# Patient Record
Sex: Female | Born: 1935 | Race: White | Hispanic: No | Smoking: Former smoker
Health system: Southern US, Community
[De-identification: ages and names within clinical notes are randomized; demographics above are authoritative.]

## PROBLEM LIST (undated history)

## (undated) DIAGNOSIS — I5022 Chronic systolic (congestive) heart failure: Secondary | ICD-10-CM

## (undated) DIAGNOSIS — E119 Type 2 diabetes mellitus without complications: Secondary | ICD-10-CM

## (undated) DIAGNOSIS — J449 Chronic obstructive pulmonary disease, unspecified: Secondary | ICD-10-CM

## (undated) DIAGNOSIS — D649 Anemia, unspecified: Secondary | ICD-10-CM

## (undated) DIAGNOSIS — I129 Hypertensive chronic kidney disease with stage 1 through stage 4 chronic kidney disease, or unspecified chronic kidney disease: Secondary | ICD-10-CM

## (undated) DIAGNOSIS — I214 Non-ST elevation (NSTEMI) myocardial infarction: Secondary | ICD-10-CM

## (undated) DIAGNOSIS — S42309A Unspecified fracture of shaft of humerus, unspecified arm, initial encounter for closed fracture: Secondary | ICD-10-CM

## (undated) DIAGNOSIS — M6281 Muscle weakness (generalized): Secondary | ICD-10-CM

## (undated) DIAGNOSIS — F419 Anxiety disorder, unspecified: Secondary | ICD-10-CM

## (undated) DIAGNOSIS — I739 Peripheral vascular disease, unspecified: Secondary | ICD-10-CM

## (undated) DIAGNOSIS — F0390 Unspecified dementia without behavioral disturbance: Secondary | ICD-10-CM

## (undated) DIAGNOSIS — I480 Paroxysmal atrial fibrillation: Secondary | ICD-10-CM

## (undated) DIAGNOSIS — R1310 Dysphagia, unspecified: Secondary | ICD-10-CM

## (undated) HISTORY — DX: Chronic obstructive pulmonary disease, unspecified: J44.9

## (undated) HISTORY — DX: Dysphagia, unspecified: R13.10

## (undated) HISTORY — DX: Unspecified dementia, unspecified severity, without behavioral disturbance, psychotic disturbance, mood disturbance, and anxiety: F03.90

## (undated) HISTORY — DX: Non-ST elevation (NSTEMI) myocardial infarction: I21.4

## (undated) HISTORY — DX: Peripheral vascular disease, unspecified: I73.9

## (undated) HISTORY — DX: Anxiety disorder, unspecified: F41.9

## (undated) HISTORY — DX: Type 2 diabetes mellitus without complications: E11.9

## (undated) HISTORY — DX: Anemia, unspecified: D64.9

## (undated) HISTORY — DX: Unspecified fracture of shaft of humerus, unspecified arm, initial encounter for closed fracture: S42.309A

## (undated) HISTORY — DX: Paroxysmal atrial fibrillation: I48.0

## (undated) HISTORY — DX: Muscle weakness (generalized): M62.81

## (undated) HISTORY — PX: PERIPHERAL ARTERIAL STENT GRAFT: SHX2220

## (undated) HISTORY — DX: Hypertensive chronic kidney disease with stage 1 through stage 4 chronic kidney disease, or unspecified chronic kidney disease: I12.9

## (undated) HISTORY — PX: OTHER SURGICAL HISTORY: SHX169

---

## 2005-01-06 ENCOUNTER — Inpatient Hospital Stay: Payer: Self-pay | Admitting: Internal Medicine

## 2005-01-06 ENCOUNTER — Other Ambulatory Visit: Payer: Self-pay

## 2005-01-07 ENCOUNTER — Other Ambulatory Visit: Payer: Self-pay

## 2007-01-24 ENCOUNTER — Other Ambulatory Visit: Payer: Self-pay

## 2007-01-24 ENCOUNTER — Emergency Department: Payer: Self-pay | Admitting: General Practice

## 2007-09-22 ENCOUNTER — Ambulatory Visit: Payer: Self-pay | Admitting: General Surgery

## 2009-09-15 ENCOUNTER — Ambulatory Visit: Payer: Self-pay | Admitting: Internal Medicine

## 2009-09-18 ENCOUNTER — Ambulatory Visit: Payer: Self-pay | Admitting: Internal Medicine

## 2009-09-22 ENCOUNTER — Ambulatory Visit: Payer: Self-pay | Admitting: Vascular Surgery

## 2009-09-26 ENCOUNTER — Ambulatory Visit: Payer: Self-pay | Admitting: Internal Medicine

## 2011-01-29 ENCOUNTER — Emergency Department: Payer: Self-pay | Admitting: Emergency Medicine

## 2011-06-10 ENCOUNTER — Ambulatory Visit: Payer: Self-pay

## 2015-02-26 ENCOUNTER — Emergency Department
Admit: 2015-02-26 | Disposition: A | Discharge status: Home / Self Care | Payer: Self-pay | Admitting: Emergency Medicine

## 2015-02-28 ENCOUNTER — Inpatient Hospital Stay
Admit: 2015-02-28 | Disposition: A | Discharge status: Skilled Nursing Facility | Payer: Self-pay | Attending: Internal Medicine | Admitting: Internal Medicine

## 2015-02-28 ENCOUNTER — Ambulatory Visit
Admit: 2015-02-28 | Disposition: A | Discharge status: Home / Self Care | Payer: Self-pay | Attending: Internal Medicine | Admitting: Internal Medicine

## 2015-02-28 LAB — COMPREHENSIVE METABOLIC PANEL
ANION GAP: 12 (ref 7–16)
Albumin: 3.5 g/dL
Alkaline Phosphatase: 50 U/L
BUN: 46 mg/dL — ABNORMAL HIGH
Bilirubin,Total: 0.5 mg/dL
Calcium, Total: 8.5 mg/dL — ABNORMAL LOW
Chloride: 101 mmol/L
Co2: 26 mmol/L
Creatinine: 2.09 mg/dL — ABNORMAL HIGH
EGFR (African American): 24 — ABNORMAL LOW
EGFR (Non-African Amer.): 21 — ABNORMAL LOW
GLUCOSE: 279 mg/dL — AB
Potassium: 5 mmol/L
SGOT(AST): 22 U/L
SGPT (ALT): 10 U/L — ABNORMAL LOW
Sodium: 139 mmol/L
Total Protein: 7.3 g/dL

## 2015-02-28 LAB — URINALYSIS, COMPLETE
BILIRUBIN, UR: NEGATIVE
Bacteria: NONE SEEN
Glucose,UR: 150 mg/dL (ref 0–75)
KETONE: NEGATIVE
Leukocyte Esterase: NEGATIVE
NITRITE: NEGATIVE
Ph: 5 (ref 4.5–8.0)
Protein: NEGATIVE
RBC,UR: NONE SEEN /HPF (ref 0–5)
SPECIFIC GRAVITY: 1.01 (ref 1.003–1.030)
WBC UR: <1 /HPF (ref 0–5)

## 2015-02-28 LAB — CK TOTAL AND CKMB (NOT AT ARMC)
CK, Total: 58 U/L
CK-MB: 3.5 ng/mL

## 2015-02-28 LAB — CBC
HCT: 24.1 % — AB (ref 35.0–47.0)
HGB: 7.5 g/dL — ABNORMAL LOW (ref 12.0–16.0)
MCH: 28.2 pg (ref 26.0–34.0)
MCHC: 31.3 g/dL — ABNORMAL LOW (ref 32.0–36.0)
MCV: 90 fL (ref 80–100)
Platelet: 189 10*3/uL (ref 150–440)
RBC: 2.67 10*6/uL — AB (ref 3.80–5.20)
RDW: 14.6 % — ABNORMAL HIGH (ref 11.5–14.5)
WBC: 11.7 10*3/uL — AB (ref 3.6–11.0)

## 2015-02-28 LAB — TROPONIN I: Troponin-I: 0.24 ng/mL — ABNORMAL HIGH

## 2015-03-01 LAB — MAGNESIUM: Magnesium: 1.5 mg/dL — ABNORMAL LOW

## 2015-03-01 LAB — CBC WITH DIFFERENTIAL/PLATELET
BASOS PCT: 0.7 %
Basophil #: 0.1 10*3/uL (ref 0.0–0.1)
Eosinophil #: 0 10*3/uL (ref 0.0–0.7)
Eosinophil %: 0 %
HCT: 20.9 % — ABNORMAL LOW (ref 35.0–47.0)
HGB: 6.8 g/dL — AB (ref 12.0–16.0)
Lymphocyte #: 2.3 10*3/uL (ref 1.0–3.6)
Lymphocyte %: 18.5 %
MCH: 28.9 pg (ref 26.0–34.0)
MCHC: 32.6 g/dL (ref 32.0–36.0)
MCV: 89 fL (ref 80–100)
MONO ABS: 1.6 x10 3/mm — AB (ref 0.2–0.9)
Monocyte %: 12.4 %
NEUTROS PCT: 68.4 %
Neutrophil #: 8.7 10*3/uL — ABNORMAL HIGH (ref 1.4–6.5)
Platelet: 175 10*3/uL (ref 150–440)
RBC: 2.36 10*6/uL — ABNORMAL LOW (ref 3.80–5.20)
RDW: 14.6 % — ABNORMAL HIGH (ref 11.5–14.5)
WBC: 12.7 10*3/uL — ABNORMAL HIGH (ref 3.6–11.0)

## 2015-03-01 LAB — LIPID PANEL
Cholesterol: 103 mg/dL
HDL Cholesterol: 34 mg/dL — ABNORMAL LOW
LDL CHOLESTEROL, CALC: 40 mg/dL
TRIGLYCERIDES: 143 mg/dL
VLDL Cholesterol, Calc: 29 mg/dL

## 2015-03-01 LAB — BASIC METABOLIC PANEL
Anion Gap: 7 (ref 7–16)
BUN: 39 mg/dL — AB
CALCIUM: 7.8 mg/dL — AB
CHLORIDE: 104 mmol/L
Co2: 27 mmol/L
Creatinine: 1.71 mg/dL — ABNORMAL HIGH
EGFR (African American): 31 — ABNORMAL LOW
EGFR (Non-African Amer.): 26 — ABNORMAL LOW
GLUCOSE: 225 mg/dL — AB
POTASSIUM: 3.8 mmol/L
Sodium: 138 mmol/L

## 2015-03-01 LAB — PRO B NATRIURETIC PEPTIDE: B-Type Natriuretic Peptide: 1944 pg/mL — ABNORMAL HIGH

## 2015-03-01 LAB — CK-MB
CK-MB: 7.7 ng/mL — AB
CK-MB: 15 ng/mL — ABNORMAL HIGH

## 2015-03-01 LAB — TROPONIN I
TROPONIN-I: 0.55 ng/mL — AB
TROPONIN-I: 1.39 ng/mL — AB

## 2015-03-01 LAB — HEMOGLOBIN A1C: Hemoglobin A1C: 6.5 % — ABNORMAL HIGH

## 2015-03-02 LAB — CBC WITH DIFFERENTIAL/PLATELET
BASOS ABS: 0 10*3/uL (ref 0.0–0.1)
BASOS PCT: 0.4 %
EOS ABS: 0.1 10*3/uL (ref 0.0–0.7)
Eosinophil %: 1.3 %
HCT: 23 % — ABNORMAL LOW (ref 35.0–47.0)
HGB: 7.4 g/dL — ABNORMAL LOW (ref 12.0–16.0)
Lymphocyte #: 2.2 10*3/uL (ref 1.0–3.6)
Lymphocyte %: 19.4 %
MCH: 28.9 pg (ref 26.0–34.0)
MCHC: 32.3 g/dL (ref 32.0–36.0)
MCV: 89 fL (ref 80–100)
Monocyte #: 1.3 x10 3/mm — ABNORMAL HIGH (ref 0.2–0.9)
Monocyte %: 11 %
Neutrophil #: 7.8 10*3/uL — ABNORMAL HIGH (ref 1.4–6.5)
Neutrophil %: 67.9 %
Platelet: 164 10*3/uL (ref 150–440)
RBC: 2.57 10*6/uL — ABNORMAL LOW (ref 3.80–5.20)
RDW: 14.6 % — AB (ref 11.5–14.5)
WBC: 11.5 10*3/uL — AB (ref 3.6–11.0)

## 2015-03-02 LAB — BASIC METABOLIC PANEL
Anion Gap: 3 — ABNORMAL LOW (ref 7–16)
BUN: 39 mg/dL — ABNORMAL HIGH
CALCIUM: 7.1 mg/dL — AB
CHLORIDE: 107 mmol/L
Co2: 24 mmol/L
Creatinine: 1.82 mg/dL — ABNORMAL HIGH
EGFR (African American): 28 — ABNORMAL LOW
EGFR (Non-African Amer.): 25 — ABNORMAL LOW
Glucose: 186 mg/dL — ABNORMAL HIGH
Potassium: 3.6 mmol/L
Sodium: 134 mmol/L — ABNORMAL LOW

## 2015-03-03 LAB — BASIC METABOLIC PANEL
ANION GAP: 9 (ref 7–16)
BUN: 45 mg/dL — ABNORMAL HIGH
CHLORIDE: 107 mmol/L
Calcium, Total: 7.7 mg/dL — ABNORMAL LOW
Co2: 22 mmol/L
Creatinine: 1.87 mg/dL — ABNORMAL HIGH
EGFR (African American): 28 — ABNORMAL LOW
EGFR (Non-African Amer.): 24 — ABNORMAL LOW
Glucose: 218 mg/dL — ABNORMAL HIGH
Potassium: 3.8 mmol/L
SODIUM: 138 mmol/L

## 2015-03-03 LAB — HEMOGLOBIN: HGB: 8.1 g/dL — AB (ref 12.0–16.0)

## 2015-03-04 LAB — CBC WITH DIFFERENTIAL/PLATELET
BASOS ABS: 0 10*3/uL (ref 0.0–0.1)
Basophil %: 0.5 %
Eosinophil #: 0.4 10*3/uL (ref 0.0–0.7)
Eosinophil %: 4.5 %
HCT: 24.4 % — ABNORMAL LOW (ref 35.0–47.0)
HGB: 7.7 g/dL — ABNORMAL LOW (ref 12.0–16.0)
Lymphocyte #: 2.5 10*3/uL (ref 1.0–3.6)
Lymphocyte %: 26.7 %
MCH: 28.9 pg (ref 26.0–34.0)
MCHC: 31.8 g/dL — AB (ref 32.0–36.0)
MCV: 91 fL (ref 80–100)
MONO ABS: 1.1 x10 3/mm — AB (ref 0.2–0.9)
Monocyte %: 11.8 %
NEUTROS PCT: 56.5 %
Neutrophil #: 5.2 10*3/uL (ref 1.4–6.5)
PLATELETS: 214 10*3/uL (ref 150–440)
RBC: 2.68 10*6/uL — ABNORMAL LOW (ref 3.80–5.20)
RDW: 15.3 % — ABNORMAL HIGH (ref 11.5–14.5)
WBC: 9.2 10*3/uL (ref 3.6–11.0)

## 2015-03-04 LAB — CREATININE, SERUM
Creatinine: 1.87 mg/dL — ABNORMAL HIGH
EGFR (African American): 28 — ABNORMAL LOW
EGFR (Non-African Amer.): 24 — ABNORMAL LOW

## 2015-03-04 LAB — MAGNESIUM: Magnesium: 3.2 mg/dL — ABNORMAL HIGH

## 2015-03-05 ENCOUNTER — Encounter
Admit: 2015-03-05 | Disposition: A | Discharge status: Home / Self Care | Payer: Self-pay | Attending: Internal Medicine | Admitting: Internal Medicine

## 2015-03-05 LAB — BASIC METABOLIC PANEL
Anion Gap: 7 (ref 7–16)
BUN: 48 mg/dL — AB
CO2: 20 mmol/L — AB
Calcium, Total: 7.8 mg/dL — ABNORMAL LOW
Chloride: 114 mmol/L — ABNORMAL HIGH
Creatinine: 1.62 mg/dL — ABNORMAL HIGH
GFR CALC AF AMER: 33 — AB
GFR CALC NON AF AMER: 28 — AB
GLUCOSE: 227 mg/dL — AB
Potassium: 4.1 mmol/L
Sodium: 141 mmol/L

## 2015-03-05 LAB — URINALYSIS, COMPLETE
BILIRUBIN, UR: NEGATIVE
GLUCOSE, UR: NEGATIVE mg/dL (ref 0–75)
Ketone: NEGATIVE
NITRITE: NEGATIVE
Ph: 5 (ref 4.5–8.0)
Protein: 30
RBC,UR: 1 /HPF (ref 0–5)
Specific Gravity: 1.012 (ref 1.003–1.030)
Squamous Epithelial: <1
WBC UR: 16 /HPF (ref 0–5)

## 2015-03-05 LAB — HEMOGLOBIN: HGB: 8.4 g/dL — ABNORMAL LOW (ref 12.0–16.0)

## 2015-03-07 LAB — CBC WITH DIFFERENTIAL/PLATELET
BASOS ABS: 0.1 10*3/uL (ref 0.0–0.1)
Basophil %: 0.7 %
EOS PCT: 2.4 %
Eosinophil #: 0.3 10*3/uL (ref 0.0–0.7)
HCT: 26.7 % — ABNORMAL LOW (ref 35.0–47.0)
HGB: 8.3 g/dL — ABNORMAL LOW (ref 12.0–16.0)
Lymphocyte #: 2.5 10*3/uL (ref 1.0–3.6)
Lymphocyte %: 20.6 %
MCH: 28.9 pg (ref 26.0–34.0)
MCHC: 31.2 g/dL — ABNORMAL LOW (ref 32.0–36.0)
MCV: 93 fL (ref 80–100)
MONO ABS: 1.2 x10 3/mm — AB (ref 0.2–0.9)
MONOS PCT: 10.3 %
NEUTROS PCT: 66 %
Neutrophil #: 7.9 10*3/uL — ABNORMAL HIGH (ref 1.4–6.5)
Platelet: 265 10*3/uL (ref 150–440)
RBC: 2.88 10*6/uL — AB (ref 3.80–5.20)
RDW: 16.1 % — ABNORMAL HIGH (ref 11.5–14.5)
WBC: 11.9 10*3/uL — AB (ref 3.6–11.0)

## 2015-03-07 LAB — COMPREHENSIVE METABOLIC PANEL
Albumin: 2.5 g/dL — ABNORMAL LOW
Alkaline Phosphatase: 68 U/L
Anion Gap: 7 (ref 7–16)
BILIRUBIN TOTAL: 0.4 mg/dL
BUN: 47 mg/dL — AB
CHLORIDE: 110 mmol/L
CREATININE: 1.78 mg/dL — AB
Calcium, Total: 8.1 mg/dL — ABNORMAL LOW
Co2: 23 mmol/L
EGFR (Non-African Amer.): 25 — ABNORMAL LOW
GFR CALC AF AMER: 29 — AB
Glucose: 209 mg/dL — ABNORMAL HIGH
Potassium: 4.2 mmol/L
SGOT(AST): 18 U/L
SGPT (ALT): 12 U/L — ABNORMAL LOW
SODIUM: 140 mmol/L
TOTAL PROTEIN: 6.1 g/dL — AB

## 2015-03-07 LAB — MAGNESIUM: Magnesium: 1.9 mg/dL

## 2015-03-07 LAB — URINE CULTURE

## 2015-03-07 LAB — TSH: Thyroid Stimulating Horm: 1.449 u[IU]/mL

## 2015-03-11 LAB — BASIC METABOLIC PANEL
ANION GAP: 7 (ref 7–16)
BUN: 47 mg/dL — ABNORMAL HIGH
CALCIUM: 8 mg/dL — AB
Chloride: 109 mmol/L
Co2: 27 mmol/L
Creatinine: 1.48 mg/dL — ABNORMAL HIGH
EGFR (African American): 37 — ABNORMAL LOW
EGFR (Non-African Amer.): 32 — ABNORMAL LOW
GLUCOSE: 209 mg/dL — AB
Potassium: 3.9 mmol/L
Sodium: 143 mmol/L

## 2015-03-11 LAB — CBC WITH DIFFERENTIAL/PLATELET
BASOS PCT: 0.4 %
Basophil #: 0 10*3/uL (ref 0.0–0.1)
EOS PCT: 3.3 %
Eosinophil #: 0.3 10*3/uL (ref 0.0–0.7)
HCT: 26.4 % — AB (ref 35.0–47.0)
HGB: 8.2 g/dL — ABNORMAL LOW (ref 12.0–16.0)
LYMPHS ABS: 1.6 10*3/uL (ref 1.0–3.6)
Lymphocyte %: 15.6 %
MCH: 29 pg (ref 26.0–34.0)
MCHC: 31.2 g/dL — ABNORMAL LOW (ref 32.0–36.0)
MCV: 93 fL (ref 80–100)
MONOS PCT: 8.1 %
Monocyte #: 0.8 x10 3/mm (ref 0.2–0.9)
Neutrophil #: 7.4 10*3/uL — ABNORMAL HIGH (ref 1.4–6.5)
Neutrophil %: 72.6 %
Platelet: 244 10*3/uL (ref 150–440)
RBC: 2.84 10*6/uL — AB (ref 3.80–5.20)
RDW: 16.3 % — ABNORMAL HIGH (ref 11.5–14.5)
WBC: 10.2 10*3/uL (ref 3.6–11.0)

## 2015-03-23 LAB — PROTIME-INR
INR: 1
Prothrombin Time: 13.2 secs

## 2015-03-27 ENCOUNTER — Encounter: Payer: Self-pay | Admitting: Cardiovascular Disease

## 2015-03-27 ENCOUNTER — Encounter (INDEPENDENT_AMBULATORY_CARE_PROVIDER_SITE_OTHER): Payer: Self-pay

## 2015-03-27 ENCOUNTER — Ambulatory Visit (INDEPENDENT_AMBULATORY_CARE_PROVIDER_SITE_OTHER): Payer: Medicare PPO | Admitting: Cardiovascular Disease

## 2015-03-27 VITALS — BP 138/48 | HR 71 | Ht 61.0 in | Wt 112.4 lb

## 2015-03-27 DIAGNOSIS — I4891 Unspecified atrial fibrillation: Secondary | ICD-10-CM | POA: Diagnosis not present

## 2015-03-27 NOTE — Patient Instructions (Signed)
Medication Instructions: - Stop plavix  Labwork: - none  Procedures/Testing: - none  Follow-Up: - We will see you back as needed  Any Additional Special Instructions Will Be Listed Below (If Applicable). - none

## 2015-03-27 NOTE — Progress Notes (Signed)
Cardiology Office Note   Date:  03/27/2015   ID:  Laura Jefferson, DOB January 04, 1925, MRN [id number]  PCP:  Marcine Matar, MD  Cardiologist:   Vesta Mixer, MD   Chief Complaint  Patient presents with  . other    Hx of afib, sob and edema. Meds reviewed verbally with pt.   Problem List 1.  Hx of Atrial fib 2. Dementia 3. Essential Hypertension 4. CHF 5. Diabetes Mellitus 6. Chronic kidney disease- estimated GFR of 24 7. Anemia   History of Present Illness: Laura Jefferson is a 81 y.o. female who presents for follow-up of her recent hospitalization at [facility].  The patient was admitted on April 1 after falling and fracturing her left humerus. She had minimal troponin elevation. She was seen in consultation by Dr. Adrian Blackwater for Cardiology .  She was deemed to not be candidate for invasive procedures and was treated medically with ASA, plavix and simvastatin.  She had an echocardiogram which revealed mild left ventricular dysfunction with an ejection fraction of 40-45%. She had moderate left atrial dilatation, mild right atrial dilatation, moderate mitral regurgitation, moderate tricuspid regurgitation. She had mild - moderate pulmonary artery hypertension with an estimated PA pressure of 43 mm hg.    On admission she was also found to have chronic kidney disease, significant anemia with hemoglobin of 7.5 that later dropped to 6.8 with hydration. She gradually improved and was discharged to [facility].  There is a reported hx of paroxysmal atrial fib but I could not find any atrial fib documented during the hospitalization.   She had an episode of shortness of breath yesterday after physical therapy.   She only partly remembers the episode .  Daughter Briant Cedar ) has been caring for her for the past 8 years - cleaning house, shopping, paying bills.  Pt's husband died several years ago and [name] has had to be more active in her care.  She just quit  smoking with this hospitalization.  Now on home O2.    Past Medical History  Diagnosis Date  . Muscle weakness   . Non-ST elevated myocardial infarction   . Humerus fracture   . Dysphagia   . Anemia   . Paroxysmal a-fib   . CHF (congestive heart failure)   . COPD (chronic obstructive pulmonary disease)   . Hypertensive chronic kidney disease   . Diabetes mellitus without complication   . PVD (peripheral vascular disease)   . Dementia   . Anxiety disorder     Past Surgical History  Procedure Laterality Date  . Arm surgery    . Peripheral arterial stent graft       Current Outpatient Prescriptions  Medication Sig Dispense Refill  . Amino Acids-Protein Hydrolys (FEEDING SUPPLEMENT, PRO-STAT SUGAR FREE 64,) LIQD Take 30 mLs by mouth 2 (two) times daily between meals.    Marland Kitchen amoxicillin-clavulanate (AUGMENTIN) 875-125 MG per tablet Take 1 tablet by mouth 2 (two) times daily.    Marland Kitchen aspirin 81 MG tablet Take 81 mg by mouth daily.    . Cholecalciferol (VITAMIN D) 2000 UNITS tablet Take 2,000 Units by mouth daily.    Marland Kitchen docusate sodium (COLACE) 100 MG capsule Take 100 mg by mouth 2 (two) times daily as needed for mild constipation.    . folic acid (FOLVITE) 400 MCG tablet Take 400 mcg by mouth daily.    . furosemide (LASIX) 40 MG tablet Take 60 mg by mouth daily.    Marland Kitchen  glipiZIDE (GLUCOTROL XL) 2.5 MG 24 hr tablet Take 2.5 mg by mouth daily with breakfast.    . GLUCERNA (GLUCERNA) LIQD Take 237 mLs by mouth 2 (two) times daily between meals.    Marland Kitchen HYDROcodone-acetaminophen (NORCO/VICODIN) 5-325 MG per tablet Take 1 tablet by mouth every 6 (six) hours as needed for moderate pain.    Marland Kitchen insulin aspart (NOVOLOG) 100 UNIT/ML injection Inject into the skin as directed.    Marland Kitchen ipratropium-albuterol (DUONEB) 0.5-2.5 (3) MG/3ML SOLN Take 3 mLs by nebulization every 6 (six) hours as needed.    . metoprolol tartrate (LOPRESSOR) 25 MG tablet Take 25 mg by mouth 2 (two) times daily.    . nicotine (NICODERM  CQ - DOSED IN MG/24 HR) 7 mg/24hr patch Place 7 mg onto the skin daily.    . OXYGEN Inhale 2 L into the lungs as needed.    Marland Kitchen PARoxetine (PAXIL) 20 MG tablet Take 20 mg by mouth daily.    Marland Kitchen senna (SENOKOT) 8.6 MG TABS tablet Take 1 tablet by mouth 2 (two) times daily as needed for mild constipation.    . simvastatin (ZOCOR) 20 MG tablet Take 20 mg by mouth daily.    . Tiotropium Bromide Monohydrate 2.5 MCG/ACT AERS Inhale into the lungs daily.    . vitamin B-12 (CYANOCOBALAMIN) 1000 MCG tablet Take 1,000 mcg by mouth daily.     No current facility-administered medications for this visit.    Allergies:   Review of patient's allergies indicates no known allergies.    Social History:  The patient  reports that she quit smoking about 4 weeks ago. Her smoking use included Cigarettes. She quit after 70 years of use. She does not have any smokeless tobacco history on file. She reports that she does not drink alcohol or use illicit drugs.   Family History:  The patient's family history includes CAD in her son.    ROS:  Please see the history of present illness.    Review of Systems: Constitutional:  denies fever, chills, diaphoresis, appetite change and fatigue.  HEENT: denies photophobia, eye pain, redness, hearing loss, ear pain, congestion, sore throat, rhinorrhea, sneezing, neck pain, neck stiffness and tinnitus.  Respiratory: denies SOB, DOE, cough, chest tightness, and wheezing.  Cardiovascular: denies chest pain, palpitations and leg swelling.  Gastrointestinal: denies nausea, vomiting, abdominal pain, diarrhea, constipation, blood in stool.  Genitourinary: denies dysuria, urgency, frequency, hematuria, flank pain and difficulty urinating.  Musculoskeletal: denies  myalgias, back pain, joint swelling, arthralgias and gait problem.   Skin: denies pallor, rash and wound.  Neurological: denies dizziness, seizures, syncope, weakness, light-headedness, numbness and headaches.     Hematological: denies adenopathy, easy bruising, personal or family bleeding history.  Psychiatric/ Behavioral: denies suicidal ideation, mood changes, confusion, nervousness, sleep disturbance and agitation.       All other systems are reviewed and negative.    PHYSICAL EXAM: VS:  BP 138/48 mmHg  Pulse 71  Ht 5\' 1"  (1.549 m)  Wt 112 lb 7 oz (51.001 kg)  BMI 21.26 kg/m2 , BMI Body mass index is 21.26 kg/(m^2). GEN: Chronically ill-appearing, elderly female. She was seated in the wheelchair for the entire exam.  HEENT: normal Neck: no JVD, carotid bruits, or masses Cardiac: RRR; no murmurs, rubs, or gallops,no edema  Respiratory:  clear to auscultation bilaterally, normal work of breathing GI: soft, nontender, nondistended, + BS MS: her left arm is in a sling.  Extensive bruising on arms and legs.  Open sore on  right lower leg - bandaged  Skin: warm and dry, no rash Neuro:  Able to talk, was not able to contribute anything significant to the conversation,  Did not remember getting short of breath yesterday during her physical therapy.  Looked to the daughter to explain the details.  Psych: moderately demented    EKG:  EKG is ordered today. The ekg ordered today demonstrates NSR at 68. Marland Kitchen No ST or T wave changes.    Recent Labs: No results found for requested labs within last 365 days.    Lipid Panel No results found for: CHOL, TRIG, HDL, CHOLHDL, VLDL, LDLCALC, LDLDIRECT    Wt Readings from Last 3 Encounters:  03/27/15 112 lb 7 oz (51.001 kg)      Other studies Reviewed: Additional studies/ records that were reviewed today include: . Review of the above records demonstrates:    ASSESSMENT AND PLAN:  1.  Hx of Atrial fib - there is history of atrial fibrillation in the chart. I've not found any EKGs to support this diagnosis at this point.  2. Dementia - she has a moderate amount of dementia. She is able to talk but is not really able to contribute anything to the  conversation. She was unable to remember having any sort of difficulty during physical therapy yesterday. The shortness breath that she had during her physical therapy session was the reason why she was referred to our office.  She is in no acute distress now. I think that we need to focus on patient's safety and patient comfort primarily.  3. Essential Hypertension - blood pressures fairly normal. 4. Mild chronic systolic congestive heart failure:  The patient has mild systolic congestive heart failure by echo. Her ejection fraction is 40-45%.   She is not in any distress at this point. She is on Lasix 60 mg a day. It's difficult to increase her diuretic given the fact that she has stage IV chronic kidney disease. I think that over diuresing her with certainly worsen her renal insufficiency. Had long discussion with the patient's daughter.  The daughters expectation is that the patient will return to independent living within a week or so. We talked for proximal 30-45 minutes about the fact that I think that she will likely have to readjust those expectations. I think that Mrs. Sparkman will likely need extensive help going forward.  There are some medical issues and psychiatric issues that complicate her care. She's not a candidate for any invasive procedures. I do not think that adding ace inhibitors or ARB's are advisable given her renal insufficiency. Her heart rate is fairly normal and I do not think that adding a beta blocker would be advantageous.  I think our best option is to have her follow-up with her general medical doctor and he can adjust the diuretics as needed/as tolerated.  5. Diabetes Mellitus - will follow-up with her primary medical doctor.  6. Chronic kidney disease- estimated GFR of 24 - her creatinine on discharge was 1.8. She is a fairly small, elderly lady in certainly this represents significant chronic kidney disease.  7. Anemia- she has significant anemia when she was admitted  to the hospital. She has lots of bruising on her arms and legs. While this bruising may be fairly normal for her, I do not think that she needs the addition of Plavix. The Plavix was added empirically because of very minimal troponin elevations when she was admitted to the hospital. I do not think that Plavix is  adding any protection from ischemic heart disease and it certainly is increasing her bleeding and bruising. We will discontinue the Plavix.  8. Question of non-ST segment elevation myocardial infarction: Her troponin elevations were very minimal. I would not necessarily call this a NSTEMI.  I think it was more consistent with a troponin leak in the setting of severe stress, anemia, and renal insufficiency. She's not a candidate for any invasive testing. She'll continue the aspirin.   Current medicines are reviewed at length with the patient today.  The patient does not have concerns regarding medicines.  The following changes have been made:  no change  Labs/ tests ordered today include:   Orders Placed This Encounter  Procedures  . EKG 12-Lead     Disposition:   FU with me as needed.       Angus Amini, Deloris Ping, MD  03/27/2015 5:38 PM    [facility] [address] [place], [place], [place]  [address] Phone: [phone]; Fax: [phone]   [facility]  [address] [place], [place]  [address] [phone]    Fax [phone]

## 2015-03-30 ENCOUNTER — Encounter
Admission: RE | Admit: 2015-03-30 | Discharge: 2015-03-30 | Disposition: A | Discharge status: Home / Self Care | Payer: Medicare PPO | Source: Ambulatory Visit | Attending: Internal Medicine | Admitting: Internal Medicine

## 2015-03-30 NOTE — Consult Note (Signed)
PATIENT NAME:  Laura Jefferson, Laura Jefferson MR#:  [id number] DATE OF BIRTH:  [birth year]  DATE OF CONSULTATION:  03/01/2015  REFERRING PHYSICIAN:   CONSULTING PHYSICIAN:  Laurier Nancy, MD  INDICATION FOR CONSULTATION: Atrial fibrillation and CHF.   HISTORY OF PRESENT ILLNESS: This is an 81 year old white female with a past medical history of COPD, CHF, diabetes, dementia, who presented to the Emergency Room with weakness. Apparently, she was also very short of breath. I talked to her and she says, "I just don't feel good." She says she feels very weak. She feels worse than yesterday. Typically, she is able to move around. She is unable to do anything right now. She also had elevated troponin when she came in. Basically, unable to get much history, except for she says she feels weak and short of breath.   PAST MEDICAL HISTORY: As mentioned.    SOCIAL HISTORY: No history of EtOH abuse or smoking. She lives at a nursing home.   FAMILY HISTORY: Use. Her son has coronary artery disease.   PHYSICAL EXAMINATION: GENERAL: She is alert and oriented x 0  VITAL SIGNS: Her temperature is 97.3, pulse is 117, respirations 18, blood pressure 120/74, saturation 90.  NECK: Positive JVD.  LUNGS: There are few crepitations at the bases.  HEART: Normal S1, S2. No audible murmur.  ABDOMEN: Soft, nontender, positive bowel sounds.  EXTREMITIES: There is 1+ pedal edema.   LABORATORY DATA: Chest x-ray shows left basilar atelectasis with some effusions bilaterally. No pneumonia. CT of the head just showed cerebral atrophy. Her white count is 11.7, hemoglobin 7.5. BUN was 46, creatinine 2.09 troponin was 0.24. EKG showed normal sinus rhythm, old inferior wall myocardial infarction, nonspecific ST and T changes. BNP is 1944.   ASSESSMENT AND PLAN: The patient has congestive heart failure anemia, history of atrial fibrillation currently in sinus rhythm, mildly elevated troponin, history of dementia. Troponin maximum was 0.55 and  then 0.24 The patient has dementia; thus, a limited work-up should be done. Advise getting an echocardiogram. Will review whether the patient has systolic or diastolic dysfunction. In the meantime, advise continuing current medications. Will add Lasix 40 mg IV once a day.   Thank you very much for the referral     ____________________________ Laurier Nancy, MD sak:mw D: 03/01/2015 10:02:39 ET T: 03/01/2015 10:36:47 ET JOB#: [id number]  cc: Laurier Nancy, MD, <Dictator> Laurier Nancy MD ELECTRONICALLY SIGNED 03/11/2015 13:33

## 2015-03-30 NOTE — H&P (Signed)
PATIENT NAME:  Laura Jefferson, Laura Jefferson MR#:  [id number] DATE OF BIRTH:  1925-08-12  DATE OF ADMISSION:  02/28/2015  CHIEF COMPLAINT: Weakness.   HISTORY OF PRESENT ILLNESS: This is an 81 year old female who presented to the ED tonight for generalized weakness. She was brought by her daughter and her son. The patient, notably, was treated here in the ED 2 days ago after she fell and broke her left arm. She had humerus fracture. Since that time at home, she has just been too weak to move around, which is not her normal state of being. Typically, she is able to move around and do most of her personal ADLs. She has not been able to do this since the fall and her fracture. Also, her left arm has gotten significantly swollen, although not tense, and she has significant discoloration from a likely underlying hematoma subsequent to the fracture. The primary reason for coming to the ED tonight was the weakness. In the ED, the patient was found to have an elevated creatinine, likely some significant dehydration and a mild troponin elevation at 0.24. She also had a very mild white count elevation at 11.7. Hospitalists were called for admission for AKI as well as the mild troponin elevation.   PRIMARY CARE PHYSICIAN: Marcine Matar., MD    PAST MEDICAL HISTORY: Includes dementia, cervical cancer, COPD, congestive heart failure, and diabetes mellitus.   CURRENT MEDICATIONS: Spiriva Respimat 2 puffs daily; simvastatin 20 mg daily; Paxil 20 mg daily; metformin 1000 mg in the morning, 500 mg in the evening; glimepiride XL 10 mg daily; Lasix 40 mg daily; aspirin 81 mg daily; Norco 5/325 mg q. 6 hours p.r.n.; Valium 10 mg at bedtime as needed for sleep.   PAST SURGICAL HISTORY: She had a left forearm fracture with rod repair in the distant past.   ALLERGIES: No known drug allergies.   FAMILY HISTORY: Includes only coronary artery disease in one of her sons.    SOCIAL HISTORY: The patient is a significant a smoker,  smoking about 1/2 pack per day now, although she smoked more in the past and has smoked for 70 years. She does not use any alcohol or illicit drugs.   REVIEW OF SYSTEMS:  CONSTITUTIONAL: Denies fever or fatigue. Endorses weakness.  EYES: Denies blurred or double vision, pain or redness.  EAR, NOSE, AND THROAT: Denies ear pain, hearing loss, or difficulty swallowing.  RESPIRATORY: Denies cough, wheeze, or dyspnea.  CARDIOVASCULAR: Denies chest pain, edema, or palpitations.  GASTROINTESTINAL: Denies nausea, vomiting, diarrhea, abdominal pain, constipation.  GENITOURINARY: Denies dysuria, hematuria, or frequency.  ENDOCRINE: Denies nocturia, heat or cold intolerance. Has had quite a bit of thirst recently. HEMATOLOGIC AND LYMPHATIC: Denies easy bruising, bleeding, swollen glands.  INTEGUMENTARY: Denies acne, rash, or lesion.  MUSCULOSKELETAL: Left arm swelling and discoloration from hematoma after known fracture. Denies gout. She has had limited activity due to this generalized weakness.  NEUROLOGICAL: Denies numbness, dysarthria, or headache.  PSYCHIATRIC: Denies anxiety, insomnia, or depression.   PHYSICAL EXAMINATION:  VITAL SIGNS: Blood pressure 155/60, pulse 101, temperature 99.1, respirations 18, saturating 96% on room air.  GENERAL: This is an elderly female lying supine in bed in no acute distress.  HEENT: Pupils equal, round, react to light. Extraocular movements intact. No scleral icterus. Dry mucosal membranes.  NECK: Thyroid not enlarged. Neck is supple. No masses, nontender. No cervical adenopathy. No JVD. RESPIRATORY: Clear to auscultation bilaterally. No rales, rhonchi, or wheezes.  CARDIOVASCULAR: Mild tachycardia. No murmurs, rubs,  or gallops on exam. Good pedal pulses. No lower extremity edema.  ABDOMEN: Soft, nontender, nondistended. Good bowel sounds.  MUSCULOSKELETAL: She has full range of motion throughout all of her extremities except for left arm. She does have left arm  swelling and discoloration from the hematoma there. The  skin is not tense in that arm. There is no other cyanosis or clubbing.  SKIN: No rash or erythema. Skin is warm, dry, and intact. She does have the bruising, as mentioned as well as some bilateral lower extremity bruising from the same fall.  LYMPHATIC: No adenopathy.  NEUROLOGIC: Cranial nerves intact. Sensation intact throughout. No dysarthria or aphasia.  PSYCHIATRIC: She is alert. She is oriented to time, person, and place. She is cooperative, although she is not able to answer all the questions in the history, deferring to her son and daughter for some of those questions.   LABORATORY DATA: White count is 11.7, hemoglobin 7.5, hematocrit 24.1, platelets 189,000. Sodium 139, potassium 5.0, chloride 101, CO2 of 26, BUN 46, creatinine 2.09. Total protein 7.3, albumin 3.5, bilirubin 0.5, alkaline phosphatase 50, AST 22, ALT 10. Glucose 279. Troponin 0.24. Urinalysis negative.   RADIOLOGY: Chest x-ray showed left basilar atelectasis with associated effusion. No pneumonia. Left humerus showed proximal humerus comminuted fracture, as described previously. No new or acute findings. Left forearm x-ray showed postsurgical changes and prior fractures. No acute fractures noted. CT head without contrast showed no acute intracranial abnormality, cerebral atrophy and small vessel ischemic change   ASSESSMENT AND PLAN:  1.  Acute kidney injury. This is due to dehydration. The patient has likely not been drinking as much, which could be contributing to her weakness. Unknown what her baseline creatinine is; however, it is definitely elevated on labs today at 2.09. We will hydrate this patient and treat her dehydration, see below, with fluids and avoid nephrotoxic medications and monitor this lab for improvement.  2.  Elevated troponin. We will trend her enzymes for this. It is unclear whether this is some heart failure related troponin leak or whether this is a  true acute coronary syndrome type pattern. Her EKG was not suspicious for any sort of myocardial infarction or acute coronary syndrome and she is not having any chest symptoms. We will, however, get an echocardiogram, as it has been several years since she has had her heart failure evaluated and does not follow regularly with a cardiologist. We will not initiate a heparin drip given her left arm hematoma and also the fact that, again, it is unclear whether this represents any sort of primary cardiac process at this time.  3.  Congestive heart failure. See above. We will get an echocardiogram and a cardiology consult. We will check a BNP to help clarify this question.  4.  New onset atrial fibrillation. The patient has paroxysmal atrial fibrillation, which has not been diagnosed before. We will have cardiology address this as well. We will get an echocardiogram to help evaluate for this. The patient is borderline tachycardic but currently under a goal rate control of 110.  5.  Left humerus fracture. This has already been evaluated and is under treatment. Pain is controlled. We will continue to monitor the patient's hemoglobin, which is pretty low just to  make sure that it continued to drop with this development of hematoma in her arm. The patient is amenable to blood transfusion if it is necessary, although it does not seem to be at this time.  6.  Frequent falls.  It is unclear if maybe the patient's paroxysmal atrial fibrillation has been contributing to this versus just her old age. We will treat her for her dehydration, acute kidney injury, and other things as listed above.  7.  Diabetes mellitus. This seems to be relatively controlled, although the patient did have some hyperglycemia here today. We will keep her on sliding scale insulin and on her metformin.  8.  Hypertension. The patient's blood pressure is a little bit elevated, though not excessively so. We will use intravenous p.r.n. medications if she  needs this if her blood pressure rises. Given her acute kidney injury, we will hold her Lasix for now and she is not on any other baseline blood pressure medication home.  9.  Deep vein thrombosis prophylaxis. Mechanical sequential compression devices only given the patient's hematoma.   CODE STATUS: This patient is DO NOT RESUSCITATE.   TIME SPENT ON THIS ADMISSION: 50 minutes.    ____________________________ Candace Cruise. Anne Hahn, MD dfw:bm D: 03/01/2015 02:01:23 ET T: 03/01/2015 03:08:25 ET JOB#: [id number]  cc: Candace Cruise. Anne Hahn, MD, <Dictator> Hershey Knauer [address]ty [address] MD ELECTRONICALLY SIGNED 03/01/2015 5:07

## 2015-03-30 NOTE — Consult Note (Signed)
Brief Consult Note: Diagnosis: Left proximal humerus fracture.   Patient was seen by consultant.   Recommend further assessment or treatment.   Comments: 81 year old female fell at home about 6 days ago injuring the left proximal humerus.  X-rays showed a comminuted minimally displaced left proximal humerus fracture. She was seen in the Emergency Room and discharged in shoulder immobilizer,.  She was admitted to [facility] on 4/1 for medical problems.  Ortho consult requested.  She is alert but confused.  Mild pain. She was on 81mg  EC ASA at home.  Received 1 unit packed red blood cells in hospital.   Exam:  Left arm and shoulder severely ecchymotic all the way to the left hand.  Skin intact.  Pain with range of motion of shoulder.  circulation/sensation/motor function good.  No other injuries noted.   X-rays:  as above  Rx: Continue shoulder immobilizer, ice, rest        return to clinic 2-3 weeks.  Electronic Signatures: Valinda Hoar (MD)  (Signed 04-Apr-16 16:23)  Authored: Brief Consult Note   Last Updated: 04-Apr-16 16:23 by Valinda Hoar (MD)

## 2015-03-30 NOTE — Discharge Summary (Signed)
PATIENT NAME:  Laura Jefferson, Laura Jefferson MR#:  [id number] DATE OF BIRTH:  November 04, 1925  DATE OF ADMISSION:  02/28/2015 DATE OF DISCHARGE:  03/05/2015  ADMITTING DIAGNOSIS:   Altered mental status.   DISCHARGE DIAGNOSES: 1. Non-ST elevation myocardial infarction.  2. Acute on chronic renal failure.  3. Acute posthemorrhagic anemia status post 1 unit of packed red blood cell transfusion.  4. Closed proximal left humerus fracture status post splint placement.  5. Altered mental status due to above.  6. Generalized weakness.  7. Right submandibular area mass.  8. Ongoing tobacco abuse.  9. History of peripheral artery disease.  10. Advanced dementia.  11. Cervical cancer. 12. Chronic obstructive pulmonary disease.  13. Congestive heart failure.  14. Diabetes mellitus type 2.   DISCHARGE CONDITION: Stable.   DISCHARGE MEDICATIONS:  The patient is to continue:   1. Furosemide 40 mg p.o. daily.  2. Simvastatin 10 mg p.o. at bedtime.  3. Paroxetine 10 mg p.o. daily.  4. Spiriva 2 puffs once daily.  5. Aspirin 81 mg p.o. daily. 6. Acetaminophen hydrocodone 325 mg/5 mg, 1 tablet every 6 hours as needed.  7. Glipizide XL 2.5 mg p.o. daily, this is new dose.  8. Plavix 75 mg p.o. daily.  9. Metoprolol tartrate 25 mg p.o. twice daily.  10. Iron sulfate 220 mg in 5 mL of elixir 7.5 mL twice daily with meals.  11. Senna 1 tablet twice daily as needed.  12. Docusate sodium 100 mg twice daily as needed.  13. Nicotine transdermal patch 7 mg, once daily.  14. Sliding scale insulin.   The patient is not to take metformin or potassium chloride unless recommended by primary care physician.   HOME OXYGEN: None.   DIET: 2 grams salt, low fat, low cholesterol, carbohydrate controlled diet, mechanical soft consistency.   ACTIVITY LIMITATIONS: As tolerated.   REFERRALS:  To physical therapy, occupational therapy.    FOLLOWUP APPOINTMENT:  With Dr. Vear Clock in 2 days after discharge, Dr. Deeann Saint in 2  to 3 weeks after discharge.   CONSULTANTS: Care management, social work, Dr. Hyacinth Meeker, Dr. Adrian Blackwater, Mr. [place], [place], Ms. Osa Craver.   RADIOLOGIC STUDIES: Echocardiogram on 4.2.2016 revealing left ventricular ejection fraction by visual estimation 40 to 45%, mildly dilated left atrium, mildly dilated right atrium, moderate mitral valve regurgitation, moderate tricuspid regurgitation, mildly elevated pulmonary arterial systolic pressure, intact intra-atrial as well as intraventricular septa with no echocardiographic evidence of intracardiac shunting. Chest x-ray, portable, single view 02/28/2015, revealed left base atelectasis with associated effusion.  Left forearm x-ray 02/28/2015 showed postsurgical changes and prior fractures, no acute fracture was noted. Left humerus x-ray 02/28/2015 showed proximal humerus comminuted fracture. No new acute findings.  Repeat chest x-ray PA and lateral 03/04/2015, revealed increased bibasilar opacities noted concerning for worsening edema with associated pleural effusions.   HOSPITAL COURSE:  The patient is an 81 year old Caucasian female with past medical history significant of recent fall and left humerus fracture who presents to the hospital with complaints of weakness. Please refer to Dr. Anne Hahn'  admission note on 02/28/2015.   On arrival to the Emergency Room, she was noted to have mild elevation of troponin.   Her initial vitals:  Temperature was 99.1, pulse was 101, respirations was 18, blood pressure 155/60, saturation was 96% on room air.  Physical exam was unremarkable except for left arm swelling and discoloration due to hematoma. No other abnormalities were found. The patient's lab data done on arrival to the Emergency Room  showed elevated glucose level of 279, BUN and creatinine were 46 and 2.09, otherwise BMP was unremarkable. Estimated GFR was 21, patient's calcium level was low at 8.5. Liver enzymes were normal. Cardiac enzymes 1st set showed  troponin elevation at 0.24 with normal MB fraction, 2nd set, 0.55 with MB fraction of 7.7, and 3rd set troponin of 1.39 and MB fraction of 15.0. White blood cell count was elevated to 11.7, hemoglobin was 7.5, platelet count was 189,000. Urinalysis was unremarkable. EKG showed sinus rhythm at 96 beats a minute, premature supraventricular complexes, rightward axis, inferior posterior infarct, age undetermined, and nonspecific T wave abnormalities evident in inferior leads.  The patient was admitted to the hospital for further evaluation. She was started on aspirin, metoprolol, nitroglycerin and echocardiogram was performed the by cardiologist recommendations. Cardiology consultation with Dr. Adrian Blackwater as well as nurse Cornelius Moras was obtained. Cardiologist felt the patient is not a candidate for cardiac intervention and recommended medical therapy. The patient was advised to continue aspirin, metoprolol, as well as Plavix, as well as simvastatin. She is to follow up with her cardiologist as needed.  1. In regards to acute on chronic renal failure, as mentioned above, the patient's kidney function was found to be markedly abnormal on arrival to the hospital with creatinine level of 2.09 and estimated GFR of 21.   The patient's metformin was stopped and she was rehydrated and her kidney function somewhat improved, although it remained relatively low signifying likely chronic renal disease. The patient's creatinine level of was 1.87 on 03/04/2015 with estimated GFR of 24. It is recommended to follow the patient's kidney function very closely and make decisions about nephrology evaluation if needed. The patient's urinalysis, as mentioned above, was unremarkable for urinary tract infection.  2. In regard to anemia, on presentation to the hospital, the patient was found to be anemic with hemoglobin level of 7.5.  With rehydration; however, her hemoglobin level even dropped down lower to 6.8. She was transfused with  1 unit of packed red blood cells after which her hemoglobin level was followed, unfortunately, not available on 03/05/2015.  The patient is to continue iron supplementation for significant anemia. It was felt that the patient's anemia was acute posthemorrhagic anemia due to humerus fracture.  3. In regards to proximal left humerus fracture, the patient was evaluated by Dr. Hyacinth Meeker who recommended sling as well as occupational and physical therapy and follow up with him in the next few weeks after discharge. 4. In regards to generalized weakness, the patient was evaluated by physical therapist and recommended rehabilitation placement.  5. While in the hospital, the patient was noted to have right submandibular area mass concerning for possible parotid gland enlargement versus lymph nodes. She will be undergoing CT scan of her neck to better evaluate that area.   Unfortunately, those results are still to come.  6. For ongoing tobacco abuse, the patient was advised to continue nicotine replacement therapy and stop smoking.  7. For chronic medical problems such as peripheral artery disease, cervical cancer, COPD, history of CHF, diabetes mellitus, the patient is to continue the above-mentioned medications.  8. In regards to diabetes mellitus, the patient's diabetic medications were changed.   Metformin was discontinued completely due to renal failure and the patient's glipizide was decreased to 2.5 mg p.o. daily dose.   It is recommended to follow the patient's blood glucose levels and advance the patient's glipizide depending on her oral intake as well as glucose levels.  The patient is being discharged to skilled nursing facility with the above-mentioned medications and follow-up. On the day of discharge, temperature was 98, pulse was 73, respirations were 22-27, blood pressure 141/80, saturation was 93 to 95% on room air at rest.   TIME SPENT:  Forty minutes.    ____________________________ Katharina Caper, MD rv:tr D: 03/05/2015 11:20:07 ET T: 03/05/2015 12:03:08 ET JOB#: [id number]  cc: Katharina Caper, MD, <Dictator> Marcine Matar., MD Valinda Hoar, MD Dalya Maselli MD ELECTRONICALLY SIGNED 03/09/2015 16:14

## 2015-04-01 ENCOUNTER — Inpatient Hospital Stay
Admission: EM | Admit: 2015-04-01 | Discharge: 2015-04-11 | DRG: 291 | Disposition: A | Discharge status: Home / Self Care | Payer: Medicare PPO | Attending: Internal Medicine | Admitting: Internal Medicine

## 2015-04-01 ENCOUNTER — Emergency Department: Payer: Medicare PPO

## 2015-04-01 ENCOUNTER — Encounter: Payer: Self-pay | Admitting: Emergency Medicine

## 2015-04-01 DIAGNOSIS — I472 Ventricular tachycardia: Secondary | ICD-10-CM

## 2015-04-01 DIAGNOSIS — I129 Hypertensive chronic kidney disease with stage 1 through stage 4 chronic kidney disease, or unspecified chronic kidney disease: Secondary | ICD-10-CM | POA: Diagnosis not present

## 2015-04-01 DIAGNOSIS — I509 Heart failure, unspecified: Secondary | ICD-10-CM | POA: Diagnosis present

## 2015-04-01 DIAGNOSIS — J441 Chronic obstructive pulmonary disease with (acute) exacerbation: Secondary | ICD-10-CM | POA: Diagnosis present

## 2015-04-01 DIAGNOSIS — R7989 Other specified abnormal findings of blood chemistry: Secondary | ICD-10-CM | POA: Diagnosis not present

## 2015-04-01 DIAGNOSIS — I5023 Acute on chronic systolic (congestive) heart failure: Principal | ICD-10-CM | POA: Diagnosis present

## 2015-04-01 DIAGNOSIS — Z87891 Personal history of nicotine dependence: Secondary | ICD-10-CM | POA: Diagnosis not present

## 2015-04-01 DIAGNOSIS — I4729 Other ventricular tachycardia: Secondary | ICD-10-CM

## 2015-04-01 DIAGNOSIS — I739 Peripheral vascular disease, unspecified: Secondary | ICD-10-CM | POA: Diagnosis not present

## 2015-04-01 DIAGNOSIS — R0602 Shortness of breath: Secondary | ICD-10-CM

## 2015-04-01 DIAGNOSIS — I248 Other forms of acute ischemic heart disease: Secondary | ICD-10-CM | POA: Diagnosis present

## 2015-04-01 DIAGNOSIS — Z7982 Long term (current) use of aspirin: Secondary | ICD-10-CM

## 2015-04-01 DIAGNOSIS — I480 Paroxysmal atrial fibrillation: Secondary | ICD-10-CM | POA: Diagnosis present

## 2015-04-01 DIAGNOSIS — I081 Rheumatic disorders of both mitral and tricuspid valves: Secondary | ICD-10-CM | POA: Diagnosis not present

## 2015-04-01 DIAGNOSIS — Z794 Long term (current) use of insulin: Secondary | ICD-10-CM

## 2015-04-01 DIAGNOSIS — M6281 Muscle weakness (generalized): Secondary | ICD-10-CM | POA: Diagnosis not present

## 2015-04-01 DIAGNOSIS — N179 Acute kidney failure, unspecified: Secondary | ICD-10-CM | POA: Diagnosis not present

## 2015-04-01 DIAGNOSIS — I5043 Acute on chronic combined systolic (congestive) and diastolic (congestive) heart failure: Secondary | ICD-10-CM | POA: Diagnosis present

## 2015-04-01 DIAGNOSIS — Z66 Do not resuscitate: Secondary | ICD-10-CM | POA: Diagnosis not present

## 2015-04-01 DIAGNOSIS — I272 Other secondary pulmonary hypertension: Secondary | ICD-10-CM | POA: Diagnosis not present

## 2015-04-01 DIAGNOSIS — Z9981 Dependence on supplemental oxygen: Secondary | ICD-10-CM | POA: Diagnosis not present

## 2015-04-01 DIAGNOSIS — I255 Ischemic cardiomyopathy: Secondary | ICD-10-CM | POA: Diagnosis not present

## 2015-04-01 DIAGNOSIS — F0390 Unspecified dementia without behavioral disturbance: Secondary | ICD-10-CM | POA: Diagnosis present

## 2015-04-01 DIAGNOSIS — D649 Anemia, unspecified: Secondary | ICD-10-CM | POA: Diagnosis not present

## 2015-04-01 DIAGNOSIS — Z79891 Long term (current) use of opiate analgesic: Secondary | ICD-10-CM | POA: Diagnosis not present

## 2015-04-01 DIAGNOSIS — E119 Type 2 diabetes mellitus without complications: Secondary | ICD-10-CM | POA: Diagnosis present

## 2015-04-01 DIAGNOSIS — D72829 Elevated white blood cell count, unspecified: Secondary | ICD-10-CM | POA: Diagnosis not present

## 2015-04-01 DIAGNOSIS — E860 Dehydration: Secondary | ICD-10-CM | POA: Diagnosis present

## 2015-04-01 DIAGNOSIS — R1310 Dysphagia, unspecified: Secondary | ICD-10-CM | POA: Diagnosis not present

## 2015-04-01 DIAGNOSIS — I252 Old myocardial infarction: Secondary | ICD-10-CM | POA: Diagnosis not present

## 2015-04-01 DIAGNOSIS — N184 Chronic kidney disease, stage 4 (severe): Secondary | ICD-10-CM | POA: Diagnosis present

## 2015-04-01 DIAGNOSIS — I5042 Chronic combined systolic (congestive) and diastolic (congestive) heart failure: Secondary | ICD-10-CM | POA: Diagnosis not present

## 2015-04-01 DIAGNOSIS — J449 Chronic obstructive pulmonary disease, unspecified: Secondary | ICD-10-CM | POA: Diagnosis present

## 2015-04-01 DIAGNOSIS — J9600 Acute respiratory failure, unspecified whether with hypoxia or hypercapnia: Secondary | ICD-10-CM | POA: Diagnosis not present

## 2015-04-01 DIAGNOSIS — I471 Supraventricular tachycardia: Secondary | ICD-10-CM | POA: Diagnosis not present

## 2015-04-01 DIAGNOSIS — F419 Anxiety disorder, unspecified: Secondary | ICD-10-CM | POA: Diagnosis present

## 2015-04-01 DIAGNOSIS — I5021 Acute systolic (congestive) heart failure: Secondary | ICD-10-CM | POA: Diagnosis not present

## 2015-04-01 HISTORY — DX: Chronic systolic (congestive) heart failure: I50.22

## 2015-04-01 LAB — GLUCOSE, CAPILLARY
GLUCOSE-CAPILLARY: 363 mg/dL — AB (ref 70–99)
GLUCOSE-CAPILLARY: 181 mg/dL — AB (ref 70–99)
Glucose-Capillary: 73 mg/dL (ref 70–99)

## 2015-04-01 LAB — CBC
HCT: 29.6 % — ABNORMAL LOW (ref 35.0–47.0)
Hemoglobin: 9 g/dL — ABNORMAL LOW (ref 12.0–16.0)
MCH: 28.9 pg (ref 26.0–34.0)
MCHC: 30.5 g/dL — ABNORMAL LOW (ref 32.0–36.0)
MCV: 94.8 fL (ref 80.0–100.0)
Platelets: 286 10*3/uL (ref 150–440)
RBC: 3.12 MIL/uL — ABNORMAL LOW (ref 3.80–5.20)
RDW: 15.8 % — AB (ref 11.5–14.5)
WBC: 21.7 10*3/uL — ABNORMAL HIGH (ref 3.6–11.0)

## 2015-04-01 LAB — COMPREHENSIVE METABOLIC PANEL
ALK PHOS: 98 U/L (ref 38–126)
ALT: 15 U/L (ref 14–54)
AST: 18 U/L (ref 15–41)
Albumin: 3 g/dL — ABNORMAL LOW (ref 3.5–5.0)
Anion gap: 11 (ref 5–15)
BUN: 66 mg/dL — AB (ref 6–20)
CHLORIDE: 100 mmol/L — AB (ref 101–111)
CO2: 29 mmol/L (ref 22–32)
Calcium: 8.2 mg/dL — ABNORMAL LOW (ref 8.9–10.3)
Creatinine, Ser: 1.62 mg/dL — ABNORMAL HIGH (ref 0.44–1.00)
GFR calc non Af Amer: 27 mL/min — ABNORMAL LOW (ref >60)
GFR, EST AFRICAN AMERICAN: 32 mL/min — AB (ref >60)
GLUCOSE: 371 mg/dL — AB (ref 65–99)
POTASSIUM: 3.9 mmol/L (ref 3.5–5.1)
SODIUM: 140 mmol/L (ref 135–145)
TOTAL PROTEIN: 7 g/dL (ref 6.5–8.1)
Total Bilirubin: 0.6 mg/dL (ref 0.3–1.2)

## 2015-04-01 LAB — TROPONIN I: Troponin I: 0.05 ng/mL — ABNORMAL HIGH (ref <0.031)

## 2015-04-01 LAB — BRAIN NATRIURETIC PEPTIDE: B Natriuretic Peptide: 671 pg/mL — ABNORMAL HIGH (ref 0.0–100.0)

## 2015-04-01 MED ORDER — ASPIRIN EC 81 MG PO TBEC
81.0000 mg | DELAYED_RELEASE_TABLET | Freq: Every day | ORAL | Status: DC
  Administered 2015-04-02 – 2015-04-11 (×9): 81 mg via ORAL
  Filled 2015-04-01 (×11): qty 1

## 2015-04-01 MED ORDER — LEVOFLOXACIN IN D5W 500 MG/100ML IV SOLN
500.0000 mg | Freq: Once | INTRAVENOUS | Status: AC
  Administered 2015-04-01: 500 mg via INTRAVENOUS
  Filled 2015-04-01: qty 100

## 2015-04-01 MED ORDER — DOCUSATE SODIUM 100 MG PO CAPS
100.0000 mg | ORAL_CAPSULE | Freq: Two times a day (BID) | ORAL | Status: DC | PRN
  Administered 2015-04-02: 100 mg via ORAL
  Filled 2015-04-01: qty 1

## 2015-04-01 MED ORDER — TIOTROPIUM BROMIDE MONOHYDRATE 2.5 MCG/ACT IN AERS: 1.0000 | INHALATION_SPRAY | Freq: Every day | RESPIRATORY_TRACT | Status: DC

## 2015-04-01 MED ORDER — FUROSEMIDE 10 MG/ML IJ SOLN
INTRAMUSCULAR | Status: AC
  Administered 2015-04-01: 60 mg via INTRAVENOUS
  Filled 2015-04-01: qty 10

## 2015-04-01 MED ORDER — ASPIRIN EC 81 MG PO TBEC: 81.0000 mg | DELAYED_RELEASE_TABLET | Freq: Every day | ORAL | Status: DC

## 2015-04-01 MED ORDER — GLIPIZIDE ER 2.5 MG PO TB24
2.5000 mg | ORAL_TABLET | Freq: Every day | ORAL | Status: DC
  Administered 2015-04-02 – 2015-04-11 (×10): 2.5 mg via ORAL
  Filled 2015-04-01 (×11): qty 1

## 2015-04-01 MED ORDER — METOPROLOL TARTRATE 25 MG PO TABS
25.0000 mg | ORAL_TABLET | Freq: Two times a day (BID) | ORAL | Status: DC
  Administered 2015-04-01: 25 mg via ORAL
  Filled 2015-04-01: qty 1

## 2015-04-01 MED ORDER — FUROSEMIDE 10 MG/ML IJ SOLN
40.0000 mg | Freq: Two times a day (BID) | INTRAMUSCULAR | Status: DC
  Administered 2015-04-01 – 2015-04-02 (×3): 40 mg via INTRAVENOUS
  Filled 2015-04-01 (×3): qty 4

## 2015-04-01 MED ORDER — HYDROCODONE-ACETAMINOPHEN 5-325 MG PO TABS
1.0000 | ORAL_TABLET | Freq: Four times a day (QID) | ORAL | Status: DC | PRN
  Administered 2015-04-01 – 2015-04-09 (×4): 1 via ORAL
  Filled 2015-04-01 (×5): qty 1

## 2015-04-01 MED ORDER — PAROXETINE HCL 10 MG PO TABS
20.0000 mg | ORAL_TABLET | Freq: Every day | ORAL | Status: DC
  Administered 2015-04-01 – 2015-04-11 (×11): 20 mg via ORAL
  Filled 2015-04-01 (×11): qty 2

## 2015-04-01 MED ORDER — CETYLPYRIDINIUM CHLORIDE 0.05 % MT LIQD
7.0000 mL | Freq: Two times a day (BID) | OROMUCOSAL | Status: DC
  Administered 2015-04-01 – 2015-04-11 (×12): 7 mL via OROMUCOSAL

## 2015-04-01 MED ORDER — VITAMIN B-12 1000 MCG PO TABS
1000.0000 ug | ORAL_TABLET | Freq: Every day | ORAL | Status: DC
  Administered 2015-04-01 – 2015-04-11 (×11): 1000 ug via ORAL
  Filled 2015-04-01 (×11): qty 1

## 2015-04-01 MED ORDER — ONDANSETRON HCL 4 MG PO TABS: 4.0000 mg | ORAL_TABLET | Freq: Four times a day (QID) | ORAL | Status: DC | PRN

## 2015-04-01 MED ORDER — FOLIC ACID 400 MCG PO TABS
400.0000 ug | ORAL_TABLET | Freq: Every day | ORAL | Status: DC
  Filled 2015-04-01: qty 1

## 2015-04-01 MED ORDER — ONDANSETRON HCL 4 MG/2ML IJ SOLN: 4.0000 mg | Freq: Four times a day (QID) | INTRAMUSCULAR | Status: DC | PRN

## 2015-04-01 MED ORDER — NITROGLYCERIN 2 % TD OINT
1.0000 [in_us] | TOPICAL_OINTMENT | Freq: Once | TRANSDERMAL | Status: AC
  Administered 2015-04-01: 1 [in_us] via TOPICAL

## 2015-04-01 MED ORDER — VITAMIN D 1000 UNITS PO TABS
2000.0000 [IU] | ORAL_TABLET | Freq: Every day | ORAL | Status: DC
  Administered 2015-04-01 – 2015-04-10 (×10): 2000 [IU] via ORAL
  Filled 2015-04-01 (×13): qty 1

## 2015-04-01 MED ORDER — ENOXAPARIN SODIUM 30 MG/0.3ML ~~LOC~~ SOLN
30.0000 mg | SUBCUTANEOUS | Status: DC
  Administered 2015-04-01 – 2015-04-11 (×11): 30 mg via SUBCUTANEOUS
  Filled 2015-04-01 (×12): qty 0.3

## 2015-04-01 MED ORDER — ACETAMINOPHEN 325 MG PO TABS: 650.0000 mg | ORAL_TABLET | Freq: Four times a day (QID) | ORAL | Status: DC | PRN

## 2015-04-01 MED ORDER — SODIUM CHLORIDE 0.9 % IJ SOLN
3.0000 mL | Freq: Two times a day (BID) | INTRAMUSCULAR | Status: DC
  Administered 2015-04-01 – 2015-04-11 (×16): 3 mL via INTRAVENOUS

## 2015-04-01 MED ORDER — NICOTINE 7 MG/24HR TD PT24
7.0000 mg | MEDICATED_PATCH | Freq: Every day | TRANSDERMAL | Status: DC
  Administered 2015-04-01 – 2015-04-11 (×12): 7 mg via TRANSDERMAL
  Filled 2015-04-01 (×12): qty 1

## 2015-04-01 MED ORDER — ACETAMINOPHEN 650 MG RE SUPP: 650.0000 mg | Freq: Four times a day (QID) | RECTAL | Status: DC | PRN

## 2015-04-01 MED ORDER — ALUM & MAG HYDROXIDE-SIMETH 200-200-20 MG/5ML PO SUSP: 30.0000 mL | Freq: Four times a day (QID) | ORAL | Status: DC | PRN

## 2015-04-01 MED ORDER — PNEUMOCOCCAL VAC POLYVALENT 25 MCG/0.5ML IJ INJ
0.5000 mL | INJECTION | INTRAMUSCULAR | Status: AC
  Administered 2015-04-02: 0.5 mL via INTRAMUSCULAR
  Filled 2015-04-01: qty 0.5

## 2015-04-01 MED ORDER — FOLIC ACID 1 MG PO TABS
0.5000 mg | ORAL_TABLET | Freq: Every day | ORAL | Status: DC
  Administered 2015-04-01 – 2015-04-11 (×11): 0.5 mg via ORAL
  Filled 2015-04-01: qty 2
  Filled 2015-04-01 (×5): qty 1
  Filled 2015-04-01: qty 0.5
  Filled 2015-04-01 (×3): qty 1
  Filled 2015-04-01: qty 0.5
  Filled 2015-04-01: qty 1

## 2015-04-01 MED ORDER — SIMVASTATIN 20 MG PO TABS
20.0000 mg | ORAL_TABLET | Freq: Every day | ORAL | Status: DC
  Administered 2015-04-01 – 2015-04-11 (×11): 20 mg via ORAL
  Filled 2015-04-01 (×11): qty 1

## 2015-04-01 MED ORDER — LEVOFLOXACIN IN D5W 500 MG/100ML IV SOLN
500.0000 mg | INTRAVENOUS | Status: DC
  Filled 2015-04-01: qty 100

## 2015-04-01 MED ORDER — SODIUM CHLORIDE 0.9 % IV SOLN: 250.0000 mL | INTRAVENOUS | Status: DC | PRN

## 2015-04-01 MED ORDER — SODIUM CHLORIDE 0.9 % IJ SOLN
3.0000 mL | INTRAMUSCULAR | Status: DC | PRN
  Administered 2015-04-07: 3 mL via INTRAVENOUS
  Filled 2015-04-01: qty 10

## 2015-04-01 MED ORDER — GLUCERNA PO LIQD
237.0000 mL | Freq: Two times a day (BID) | ORAL | Status: DC
  Administered 2015-04-01 – 2015-04-11 (×14): 237 mL via ORAL
  Filled 2015-04-01: qty 237

## 2015-04-01 MED ORDER — METOPROLOL TARTRATE 25 MG PO TABS
12.5000 mg | ORAL_TABLET | Freq: Two times a day (BID) | ORAL | Status: DC
Start: 2015-04-01 — End: 2015-04-05
  Administered 2015-04-01: 25 mg via ORAL
  Administered 2015-04-02 (×2): 12.5 mg via ORAL
  Administered 2015-04-03: 25 mg via ORAL
  Administered 2015-04-03 – 2015-04-05 (×3): 12.5 mg via ORAL
  Filled 2015-04-01 (×8): qty 1

## 2015-04-01 MED ORDER — TIOTROPIUM BROMIDE MONOHYDRATE 18 MCG IN CAPS
18.0000 ug | ORAL_CAPSULE | Freq: Every day | RESPIRATORY_TRACT | Status: DC
  Administered 2015-04-01 – 2015-04-11 (×8): 18 ug via RESPIRATORY_TRACT
  Filled 2015-04-01 (×2): qty 5

## 2015-04-01 MED ORDER — SENNA 8.6 MG PO TABS
1.0000 | ORAL_TABLET | Freq: Two times a day (BID) | ORAL | Status: DC | PRN
  Administered 2015-04-06: 8.6 mg via ORAL
  Filled 2015-04-01 (×2): qty 1

## 2015-04-01 MED ORDER — ASPIRIN 81 MG PO TABS
81.0000 mg | ORAL_TABLET | Freq: Every day | ORAL | Status: DC
  Administered 2015-04-01: 81 mg via ORAL
  Filled 2015-04-01 (×2): qty 1

## 2015-04-01 MED ORDER — LEVOFLOXACIN IN D5W 500 MG/100ML IV SOLN
INTRAVENOUS | Status: AC
  Administered 2015-04-01: 500 mg
  Filled 2015-04-01: qty 100

## 2015-04-01 MED ORDER — NITROGLYCERIN 2 % TD OINT
TOPICAL_OINTMENT | TRANSDERMAL | Status: AC
Start: 2015-04-01 — End: 2015-04-01
  Administered 2015-04-01: 1 [in_us] via TOPICAL
  Filled 2015-04-01: qty 1

## 2015-04-01 MED ORDER — INSULIN GLARGINE 100 UNIT/ML ~~LOC~~ SOLN
10.0000 [IU] | Freq: Every day | SUBCUTANEOUS | Status: DC
  Administered 2015-04-01 – 2015-04-09 (×9): 10 [IU] via SUBCUTANEOUS
  Filled 2015-04-01 (×12): qty 0.1

## 2015-04-01 MED ORDER — FUROSEMIDE 10 MG/ML IJ SOLN
60.0000 mg | Freq: Once | INTRAMUSCULAR | Status: AC
Start: 2015-04-01 — End: 2015-04-01
  Administered 2015-04-01: 60 mg via INTRAVENOUS

## 2015-04-01 MED ORDER — LEVOFLOXACIN IN D5W 250 MG/50ML IV SOLN
250.0000 mg | INTRAVENOUS | Status: DC
  Filled 2015-04-01: qty 50

## 2015-04-01 MED ORDER — IPRATROPIUM-ALBUTEROL 0.5-2.5 (3) MG/3ML IN SOLN
3.0000 mL | Freq: Four times a day (QID) | RESPIRATORY_TRACT | Status: DC | PRN
  Administered 2015-04-03 – 2015-04-07 (×2): 3 mL via RESPIRATORY_TRACT
  Filled 2015-04-01 (×2): qty 3

## 2015-04-01 MED ORDER — PRO-STAT SUGAR FREE PO LIQD
30.0000 mL | Freq: Two times a day (BID) | ORAL | Status: DC
  Administered 2015-04-03 – 2015-04-11 (×14): 30 mL via ORAL

## 2015-04-01 MED ORDER — INSULIN ASPART 100 UNIT/ML ~~LOC~~ SOLN
0.0000 [IU] | Freq: Three times a day (TID) | SUBCUTANEOUS | Status: DC
  Administered 2015-04-01: 4 [IU] via SUBCUTANEOUS
  Administered 2015-04-01: 20 [IU] via SUBCUTANEOUS
  Administered 2015-04-02: 3 [IU] via SUBCUTANEOUS
  Administered 2015-04-02 – 2015-04-03 (×2): 4 [IU] via SUBCUTANEOUS
  Administered 2015-04-04 – 2015-04-05 (×5): 3 [IU] via SUBCUTANEOUS
  Administered 2015-04-06: 7 [IU] via SUBCUTANEOUS
  Administered 2015-04-06: 4 [IU] via SUBCUTANEOUS
  Administered 2015-04-06: 5 [IU] via SUBCUTANEOUS
  Administered 2015-04-07 – 2015-04-08 (×3): 4 [IU] via SUBCUTANEOUS
  Administered 2015-04-09: 7 [IU] via SUBCUTANEOUS
  Administered 2015-04-09: 4 [IU] via SUBCUTANEOUS
  Administered 2015-04-09: 7 [IU] via SUBCUTANEOUS
  Administered 2015-04-10: 3 [IU] via SUBCUTANEOUS
  Administered 2015-04-10 – 2015-04-11 (×3): 4 [IU] via SUBCUTANEOUS
  Administered 2015-04-11: 7 [IU] via SUBCUTANEOUS
  Filled 2015-04-01: qty 3
  Filled 2015-04-01 (×5): qty 4
  Filled 2015-04-01: qty 3
  Filled 2015-04-01 (×2): qty 4
  Filled 2015-04-01: qty 3
  Filled 2015-04-01: qty 20
  Filled 2015-04-01: qty 7
  Filled 2015-04-01: qty 4
  Filled 2015-04-01: qty 3
  Filled 2015-04-01: qty 4
  Filled 2015-04-01: qty 5
  Filled 2015-04-01: qty 2
  Filled 2015-04-01 (×2): qty 7
  Filled 2015-04-01: qty 3
  Filled 2015-04-01: qty 4
  Filled 2015-04-01: qty 7
  Filled 2015-04-01: qty 3
  Filled 2015-04-01: qty 4
  Filled 2015-04-01: qty 3
  Filled 2015-04-01: qty 2

## 2015-04-01 NOTE — ED Notes (Signed)
Pt presents to ED via EMS from home with c/o respiratory distress. Per EMS, pt's daughter found her this morning with increased work of breathing. EMS reports O2 sat of 87% on 4L O2 nasal cannula. EMS reports that pt was released yesterday from [facility].

## 2015-04-01 NOTE — Evaluation (Signed)
Physical Therapy Evaluation Patient Details Name: Laura Jefferson MRN: [id number] DOB: [birth year] Today's Date: 04/01/2015   History of Present Illness  81 yo female with onset of SOB after recent SNF discharge on home O2.  Recent L humeral fracture and NWB, demand ischemia with elevated troponin, acute respiratory failure.  PMHx:  CKD, PAF, NSTEMI, EF 40-45%, anxiety, PVD, dementia, COPD, DM, CHF  Clinical Impression  Pt was seen for assessment of her tolerance for activity.  Her recent dc from SNF is concerning to her daughter, who thinks pt may be medically fragile.  PT would prefer SNF but pt can have 24/7 care with her daughter and therefore can take her home.  Will focus on strengthening and standing tolerance/balance with LRAD.    Follow Up Recommendations Home health PT;Supervision/Assistance - 24 hour    Equipment Recommendations  None recommended by PT    Recommendations for Other Services       Precautions / Restrictions Precautions Precautions: Fall;Other (comment) (telemetry) Restrictions Weight Bearing Restrictions: No      Mobility  Bed Mobility Overal bed mobility: Needs Assistance Bed Mobility: Rolling;Supine to Sit Rolling: Min assist   Supine to sit: Min assist;Mod assist     General bed mobility comments: using no rails as she came up to L side of bed and cannot use L UE in sling  Transfers Overall transfer level: Needs assistance Equipment used: 1 person hand held assist Transfers: Sit to/from [employer] Sit to Stand: Mod assist Stand pivot transfers: Min assist       General transfer comment: used belt on her waist and gave her assist on RUE  Ambulation/Gait Ambulation/Gait assistance: Min assist Ambulation Distance (Feet): 30 Feet Assistive device: 1 person hand held assist Gait Pattern/deviations: Step-through pattern;Decreased step length - left;Decreased step length - right;Ataxic;Wide base of support Gait velocity:  reduced Gait velocity interpretation: Below normal speed for age/gender General Gait Details: Two trips of 15' each, with assistance to get onto chair  in BR and onto recliner in her room  Stairs            Wheelchair Mobility    Modified Rankin (Stroke Patients Only)       Balance Overall balance assessment: Needs assistance Sitting-balance support: Feet supported;Single extremity supported Sitting balance-Leahy Scale: Fair   Postural control: Posterior lean Standing balance support: Single extremity supported Standing balance-Leahy Scale: Poor Standing balance comment: stiff in ankles and makes control of initial standing limited                             Pertinent Vitals/Pain Pain Assessment: Faces Faces Pain Scale: Hurts little more Pain Location: L arm Pain Intervention(s): Limited activity within patient's tolerance;Monitored during session;Premedicated before session;Repositioned    Home Living Family/patient expects to be discharged to:: Private residence Living Arrangements: Children Available Help at Discharge: Available 24 hours/day;Family Type of Home: House Home Access: Stairs to enter Entrance Stairs-Rails: Left Entrance Stairs-Number of Steps: 3+1 Home Layout: One level Home Equipment: Cane - quad;Shower seat;Bedside commode      Prior Function Level of Independence: Needs assistance   Gait / Transfers Assistance Needed: min assist on quad cane just dc from SNF  ADL's / Homemaking Assistance Needed: family assistance        Hand Dominance        Extremity/Trunk Assessment   Upper Extremity Assessment: LUE deficits/detail       LUE Deficits /  Details: humeral fracture and in sling   Lower Extremity Assessment: Generalized weakness      Cervical / Trunk Assessment: Kyphotic  Communication   Communication: HOH  Cognition Arousal/Alertness: Awake/alert Behavior During Therapy: Impulsive;WFL for tasks  assessed/performed (alternately) Overall Cognitive Status: History of cognitive impairments - at baseline       Memory: Decreased short-term memory;Decreased recall of precautions              General Comments General comments (skin integrity, edema, etc.): Pt has mult areas of bruising and a hematoma onposterior R calf, with bandage overlying.    Exercises        Assessment/Plan    PT Assessment Patient needs continued PT services  PT Diagnosis Generalized weakness;Altered mental status   PT Problem List Decreased strength;Decreased range of motion;Decreased activity tolerance;Decreased balance;Decreased mobility;Decreased coordination;Decreased cognition;Decreased knowledge of use of DME;Decreased safety awareness;Decreased knowledge of precautions;Cardiopulmonary status limiting activity;Decreased skin integrity;Pain  PT Treatment Interventions DME instruction;Gait training;Functional mobility training;Stair training;Therapeutic activities;Therapeutic exercise;Balance training;Neuromuscular re-education;Cognitive remediation;Patient/family education   PT Goals (Current goals can be found in the Care Plan section) Acute Rehab PT Goals Patient Stated Goal: none stated PT Goal Formulation: With patient/family Time For Goal Achievement: 04/15/15 Potential to Achieve Goals: Good    Frequency Min 2X/week   Barriers to discharge Inaccessible home environment      Co-evaluation               End of Session Equipment Utilized During Treatment: Gait belt;Oxygen Activity Tolerance: Patient tolerated treatment well;Patient limited by fatigue Patient left: in chair;with call bell/phone within reach;with family/visitor present Nurse Communication: Mobility status         Time: 5188-4166 PT Time Calculation (min) (ACUTE ONLY): 44 min   Charges:   PT Evaluation $Initial PT Evaluation Tier I: 1 Procedure PT Treatments $Gait Training: 8-22 mins $Therapeutic Activity:  8-22 mins   PT G Codes:        Winnifred Friar, PT MS Acute Rehab Dept. Number: [phone] 04/01/2015, 3:15 PM

## 2015-04-01 NOTE — ED Provider Notes (Signed)
[facility] Emergency Department Provider Note  Time seen: 7:08 AM  I have reviewed the triage vital signs and the nursing notes.   HISTORY  Chief Complaint Respiratory Distress    HPI Laura Jefferson is a 81 y.o. female with a past medical history of recently diagnosed congestive heart failure, along with COPD, hypertension, diabetes who presents to the emergency department with shortness of breath. According to paramedic reports the daughter arrived at the patient's residence this morning and found her very short of breath with difficulty breathing and called EMS. Upon EMS arrival patient in moderate respiratory distress, unable to speak due to shortness of breath, mild diaphoresis. Patient does state chest tightness/pain as well. EMS states 85-87% on 4 L nasal cannula oxygen. Patient usually wears 2 L nasal cannula at home. Patient unable to describe the chest pain any further due to her shortness of breath. Patient unable to give a time course of her symptoms.    Past Medical History  Diagnosis Date  . Muscle weakness   . Non-ST elevated myocardial infarction   . Humerus fracture   . Dysphagia   . Anemia   . Paroxysmal a-fib   . CHF (congestive heart failure)   . COPD (chronic obstructive pulmonary disease)   . Hypertensive chronic kidney disease   . Diabetes mellitus without complication   . PVD (peripheral vascular disease)   . Dementia   . Anxiety disorder     There are no active problems to display for this patient.   Past Surgical History  Procedure Laterality Date  . Arm surgery    . Peripheral arterial stent graft      Current Outpatient Rx  Name  Route  Sig  Dispense  Refill  . Amino Acids-Protein Hydrolys (FEEDING SUPPLEMENT, PRO-STAT SUGAR FREE 64,) LIQD   Oral   Take 30 mLs by mouth 2 (two) times daily between meals.         Marland Kitchen amoxicillin-clavulanate (AUGMENTIN) 875-125 MG per tablet   Oral   Take 1 tablet by mouth 2 (two)  times daily.         Marland Kitchen aspirin 81 MG tablet   Oral   Take 81 mg by mouth daily.         . Cholecalciferol (VITAMIN D) 2000 UNITS tablet   Oral   Take 2,000 Units by mouth daily.         Marland Kitchen docusate sodium (COLACE) 100 MG capsule   Oral   Take 100 mg by mouth 2 (two) times daily as needed for mild constipation.         . folic acid (FOLVITE) 400 MCG tablet   Oral   Take 400 mcg by mouth daily.         . furosemide (LASIX) 40 MG tablet   Oral   Take 60 mg by mouth daily.         Marland Kitchen glipiZIDE (GLUCOTROL XL) 2.5 MG 24 hr tablet   Oral   Take 2.5 mg by mouth daily with breakfast.         . GLUCERNA (GLUCERNA) LIQD   Oral   Take 237 mLs by mouth 2 (two) times daily between meals.         Marland Kitchen HYDROcodone-acetaminophen (NORCO/VICODIN) 5-325 MG per tablet   Oral   Take 1 tablet by mouth every 6 (six) hours as needed for moderate pain.         Marland Kitchen insulin aspart (NOVOLOG) 100 UNIT/ML  injection   Subcutaneous   Inject into the skin as directed.         Marland Kitchen ipratropium-albuterol (DUONEB) 0.5-2.5 (3) MG/3ML SOLN   Nebulization   Take 3 mLs by nebulization every 6 (six) hours as needed.         . metoprolol tartrate (LOPRESSOR) 25 MG tablet   Oral   Take 25 mg by mouth 2 (two) times daily.         . nicotine (NICODERM CQ - DOSED IN MG/24 HR) 7 mg/24hr patch   Transdermal   Place 7 mg onto the skin daily.         . OXYGEN   Inhalation   Inhale 2 L into the lungs as needed.         Marland Kitchen PARoxetine (PAXIL) 20 MG tablet   Oral   Take 20 mg by mouth daily.         Marland Kitchen senna (SENOKOT) 8.6 MG TABS tablet   Oral   Take 1 tablet by mouth 2 (two) times daily as needed for mild constipation.         . simvastatin (ZOCOR) 20 MG tablet   Oral   Take 20 mg by mouth daily.         . Tiotropium Bromide Monohydrate 2.5 MCG/ACT AERS   Inhalation   Inhale into the lungs daily.         . vitamin B-12 (CYANOCOBALAMIN) 1000 MCG tablet   Oral   Take 1,000 mcg  by mouth daily.           Allergies Review of patient's allergies indicates no known allergies.  Family History  Problem Relation Age of Onset  . CAD Son     Social History History  Substance Use Topics  . Smoking status: Former Smoker -- 70 years    Types: Cigarettes    Quit date: 02/24/2015  . Smokeless tobacco: Not on file  . Alcohol Use: No    Review of Systems Constitutional: Negative for fever. Cardiovascular: Positive for chest pain. Respiratory: Positive for shortness of breath. Gastrointestinal: Negative for abdominal pain  10-point ROS otherwise negative.  ____________________________________________   PHYSICAL EXAM:  VITAL SIGNS: ED Triage Vitals  Enc Vitals Group     BP 04/01/15 0703 205/67 mmHg     Pulse Rate 04/01/15 0703 121     Resp 04/01/15 0703 27     Temp 04/01/15 0703 96.6 F (35.9 C)     Temp Source 04/01/15 0703 Axillary     SpO2 04/01/15 0703 100 %     Weight 04/01/15 0704 116 lb (52.617 kg)     Height 04/01/15 0704 5' (1.524 m)     Head Cir --      Peak Flow --      Pain Score 04/01/15 0705 0     Pain Loc --      Pain Edu? --      Excl. in GC? --     Constitutional: Moderate respiratory distress with accessory muscle use. Eyes: Normal exam Cardiovascular: Regular rhythm approximately 120 bpm. Respiratory: Moderate respiratory distress with mild bilateral wheezes, decreased air movement bilaterally, mild rales in bilateral lower lung fields. Gastrointestinal: Soft and nontender Musculoskeletal: Nontender with normal range of motion in all extremities. No lower extremity tenderness or edema. Neurologic:  Normal speech and language. No gross focal neurologic deficits  Skin:  Skin is warm, dry and intact.   ____________________________________________    EKG  EKG shows  sinus tachycardia at 120 bpm right axis deviation, normal intervals. Nonspecific ST changes, somewhat consistent with lateral ischemia no ST elevations  noted.  ____________________________________________    RADIOLOGY  Chest x-ray consistent with congestive heart failure    INITIAL IMPRESSION / ASSESSMENT AND PLAN / ED COURSE  Pertinent labs & imaging results that were available during my care of the patient were reviewed by me and considered in my medical decision making (see chart for details).  81 year old female with multiple medical problems per paramedic report recent [facility]ation [facility]. Patient currently hypertensive 206 systolic, along with rales on exam most consistent with CHF/pulmonary edema. Place 1 inch of nitroglycerin paste on the patient, will place on BiPAP. EKG is not consistent with STEMI, but cannot rule out ischemic changes. We will check labs including cardiac enzymes, chest x-ray, continue to monitor closely in the emergency department while awaiting results.  ----------------------------------------- 7:58 AM on 04/01/2015 -----------------------------------------  Chest x-ray most consistent with congestive heart failure. Labs show white count elevation of 21, currently awaiting BMP and troponin result. Patient will require admission to the hospital for congestive heart failure exacerbation. We'll proceed to dose Lasix 60 mg IV.  CRITICAL CARE Performed by: Minna Antis   Total critical care time: 30  Critical care time was exclusive of separately billable procedures and treating other patients.  Critical care was necessary to treat or prevent imminent or life-threatening deterioration.  Critical care was time spent personally by me on the following activities: development of treatment plan with patient and/or surrogate as well as nursing, discussions with consultants, evaluation of patient's response to treatment, examination of patient, obtaining history from patient or surrogate, ordering and performing treatments and interventions, ordering and review  of laboratory studies, ordering and review of radiographic studies, pulse oximetry and re-evaluation of patient's condition.  ----------------------------------------____________________________________   FINAL CLINICAL IMPRESSION(S) / ED DIAGNOSES  Dyspnea CHF exacerbation   Minna Antis, MD 04/01/15 [phone]

## 2015-04-01 NOTE — Consult Note (Signed)
Cardiology Consultation Note  Patient ID: Laura Jefferson, MRN: [id number], DOB/AGE: 01/02/1925 81 y.o. Admit date: 04/01/2015   Date of Consult: 04/01/2015 Primary Physician: Marcine Matar, MD Primary Cardiologist: Dr. Elease Hashimoto, MD  Chief Complaint: Respiratory distress x 1 day Reason for Consult: Possible acute on chronic systolic CHF  HPI: 81 y.o. female who was recently admitted to [facility] 02/2015 after suffering a fall and fracturing her left humerus in the setting of increased weakness. She has history of reported PAF, not on anticoagulation and no documented EKGs showing a-fib, chronic systolic CHF, COPD, CKD stage IV, IDDM, dementia, and anemia who presented to [facility] on 5/3 after developing acute respiratory distress.    During patient's recent admission 02/2015 for weakness after suffering a fall and fracturing her left humerus just 2 days prior she was found to have an elevated troponin with a peak of 1.39. She was found to have acute on CKD with SCr 2.09 upon admission that improved to 1.48 upon discharge. She was also found to be anemic upon admission with a hgb of 7.5 that improved to 8.2 upon discharge. She was consulted on by Dr. Park Breed and deemed to not be a candidate for invasive procedures and was treated medically with aspirin, Plavix, and simvastatin. Echocardiogram showed an ejection fraction of 40-45%. She had moderate left atrial dilatation, mild right atrial dilatation, moderate mitral regurgitation, moderate tricuspid regurgitation. She had mild - moderate pulmonary artery hypertension with an estimated PA pressure of 43 mm Hg. She was diuresed and continued on Lasix 40 mg daily, and Lopressor 25 mg bid. There was no documented a-fib during her admission. She was discharged to [facility]. In hospital follow up she saw Dr. Elease Hashimoto, MD who reported she just quit smoking with the above admission. Currently on home oxygen. Her daughter is now more involved in the  patient's care. It was felt she was not in any distress on 4/28, on Lasix 60 mg daily at that time, and it would be difficult to increase her diuretic given her baseline renal insufficiency. The daughter's expectation was the patient would return back to independent living, however this was felt to be unlikely given her baseline status - a long discussion was had at that time. She was not felt to be a candidate for ACEi or ARBs given her renal insufficiency. Her HR was normal and adding a BB was not felt to be advantageous at that time. Her EKG showed NSR, 68 bpm, no st/t changes. Her Plavix was discontinued at that time.   She presented to [facility] on 04/01/2015 with a 1 day history of increased dyspnea. She was just discharged from inpatient rehab at [facility] on 03/31/2015. She was brought back in by her daughter, who is not currently here to gather any history. Per H&P she was in acute respiratory distress requiring BiPAP that has subsequently been able to be tapered back down to O2 via nasal cannula at 3L. Patient denies having had any chest pain (though there is baseline dementia). She does indorse some SOB and cough. Afebrile. No chills. Weight has decreased from her last admission weight 114.3-->109 currently. No lower extremity edema. Appetite has decreased. Upon her arrival she was found to have a WBC 21.7, troponin 0.05, pBNP 671.0, blood cultures x 2 have been drawn, SCr 1.62 (baseline 1.4 range). CXR showed early CHF per report. She was started on IV Lasix 40 mg bid, Levaquin, Duonebs, and the Lopressor she was discharged from  [facility] in April was continued. She reports her breathing is improved this afternoon.    Past Medical History  Diagnosis Date  . Muscle weakness   . Non-ST elevated myocardial infarction     a. likely demand ischemia 02/2015 in the setting of ARF and acute on chronic CHF  . Humerus fracture     a. left, b. 02/2015; c. in the setting of increased weakness  . Dysphagia   .  Anemia   . Paroxysmal a-fib     a. reported a-fib, no documented EKGs showing this  . Chronic systolic CHF (congestive heart failure)     a. echo 02/2015: EF 40-45%, mod dilated LA, mildly dilated RA, mod MR/TR, mildly elevated PASP 43 mm Hg, no evidence of intracardiac shunting, intact interatrial and interventricular septa  . COPD (chronic obstructive pulmonary disease)     a. on home O2  . Hypertensive chronic kidney disease   . Diabetes mellitus without complication   . PVD (peripheral vascular disease)   . Dementia   . Anxiety disorder       Most Recent Cardiac Studies: Echo 03/01/2015 read by Dr. Park Breed:  Summary:  1. Left ventricular ejection fraction, by visual estimation, is 40 to 45%.  2. Moderately dilated left atrium.  3. Mildly dilated right atrium.  4. Moderate mitral valve regurgitation.  5. Moderate tricuspid regurgitation.  6. Mildly elevated pulmonary artery systolic pressure.  7. Intact interatrial and interventricular septa appear intact, with no  echocardiographic evidence of intracardiac shunting.   Surgical History:  Past Surgical History  Procedure Laterality Date  . Arm surgery    . Peripheral arterial stent graft       Home Meds: Prior to Admission medications   Medication Sig Start Date End Date Taking? Authorizing Provider  Amino Acids-Protein Hydrolys (FEEDING SUPPLEMENT, PRO-STAT SUGAR FREE 64,) LIQD Take 30 mLs by mouth 2 (two) times daily between meals.   Yes Historical Provider, MD  aspirin 81 MG tablet Take 81 mg by mouth daily.   Yes Historical Provider, MD  Cholecalciferol (VITAMIN D) 2000 UNITS tablet Take 2,000 Units by mouth daily.   Yes Historical Provider, MD  docusate sodium (COLACE) 100 MG capsule Take 100 mg by mouth 2 (two) times daily as needed for mild constipation.   Yes Historical Provider, MD  folic acid (FOLVITE) 400 MCG tablet Take 400 mcg by mouth daily.   Yes Historical Provider, MD  furosemide (LASIX) 40 MG tablet Take 60 mg  by mouth daily.   Yes Historical Provider, MD  glipiZIDE (GLUCOTROL XL) 2.5 MG 24 hr tablet Take 2.5 mg by mouth daily with breakfast.   Yes Historical Provider, MD  GLUCERNA (GLUCERNA) LIQD Take 237 mLs by mouth 2 (two) times daily between meals.   Yes Historical Provider, MD  HYDROcodone-acetaminophen (NORCO/VICODIN) 5-325 MG per tablet Take 1 tablet by mouth every 6 (six) hours as needed for moderate pain.   Yes Historical Provider, MD  insulin aspart (NOVOLOG) 100 UNIT/ML injection Inject into the skin as directed. Per Sliding scale- Before meals PRN  Blood sugar: 81-175 give 0 units 176-250 give 3 units 251-325 give 6 units 326-450 give 10 units >450 give 12 units   Yes Historical Provider, MD  ipratropium-albuterol (DUONEB) 0.5-2.5 (3) MG/3ML SOLN Take 3 mLs by nebulization every 6 (six) hours as needed.   Yes Historical Provider, MD  metoprolol tartrate (LOPRESSOR) 25 MG tablet Take 25 mg by mouth 2 (two) times daily.   Yes Historical Provider,  MD  nicotine (NICODERM CQ - DOSED IN MG/24 HR) 7 mg/24hr patch Place 7 mg onto the skin daily.   Yes Historical Provider, MD  OXYGEN Inhale 2 L into the lungs as needed.   Yes Historical Provider, MD  PARoxetine (PAXIL) 20 MG tablet Take 20 mg by mouth daily.   Yes Historical Provider, MD  senna (SENOKOT) 8.6 MG TABS tablet Take 1 tablet by mouth 2 (two) times daily as needed for mild constipation.   Yes Historical Provider, MD  simvastatin (ZOCOR) 20 MG tablet Take 20 mg by mouth daily.   Yes Historical Provider, MD  Tiotropium Bromide Monohydrate 2.5 MCG/ACT AERS Inhale 2 puffs into the lungs daily.    Yes Historical Provider, MD  UNABLE TO FIND Diaper dermatitis cream- apply liberally  as needed   Yes Historical Provider, MD  vitamin B-12 (CYANOCOBALAMIN) 1000 MCG tablet Take 1,000 mcg by mouth daily.   Yes Historical Provider, MD  amoxicillin-clavulanate (AUGMENTIN) 875-125 MG per tablet Take 1 tablet by mouth 2 (two) times daily.     Historical Provider, MD    Inpatient Medications:  . aspirin  81 mg Oral Daily  . enoxaparin (LOVENOX) injection  30 mg Subcutaneous Q24H  . feeding supplement (PRO-STAT SUGAR FREE 64)  30 mL Oral BID BM  . folic acid  0.5 mg Oral Daily  . furosemide  40 mg Intravenous BID  . [START ON 04/02/2015] glipiZIDE  2.5 mg Oral Q breakfast  . GLUCERNA  237 mL Oral BID BM  . insulin aspart  0-20 Units Subcutaneous TID WC  . insulin glargine  10 Units Subcutaneous Daily  . [START ON 04/03/2015] levofloxacin (LEVAQUIN) IV  250 mg Intravenous Q48H  . levofloxacin (LEVAQUIN) IV  500 mg Intravenous Once  . metoprolol tartrate  25 mg Oral BID  . nicotine  7 mg Transdermal Daily  . PARoxetine  20 mg Oral Daily  . simvastatin  20 mg Oral Daily  . sodium chloride  3 mL Intravenous Q12H  . tiotropium  18 mcg Inhalation Daily  . vitamin B-12  1,000 mcg Oral Daily  . Vitamin D  2,000 Units Oral Daily      Allergies: No Known Allergies  History   Social History  . Marital Status: Widowed    Spouse Name: N/A  . Number of Children: N/A  . Years of Education: N/A   Occupational History  . Not on file.   Social History Main Topics  . Smoking status: Former Smoker -- 70 years    Types: Cigarettes    Quit date: 02/24/2015  . Smokeless tobacco: Not on file  . Alcohol Use: No  . Drug Use: No  . Sexual Activity: Not on file   Other Topics Concern  . Not on file   Social History Narrative     Family History  Problem Relation Age of Onset  . CAD Son      Review of Systems: Review of Systems  Constitutional: Positive for malaise/fatigue. Negative for fever, chills, weight loss and diaphoresis.  HENT: Negative for hearing loss.   Eyes: Negative for blurred vision, double vision, photophobia, pain, discharge and redness.  Respiratory: Positive for cough, shortness of breath and wheezing. Negative for hemoptysis and sputum production.   Cardiovascular: Negative for chest pain, palpitations,  orthopnea, claudication, leg swelling and PND.  Musculoskeletal: Positive for falls.  Skin: Negative for itching and rash.  Neurological: Positive for weakness. Negative for headaches.  All other systems reviewed and  are negative.    Labs:  Recent Labs  04/01/15 0707  TROPONINI 0.05*   Lab Results  Component Value Date   WBC 21.7* 04/01/2015   HGB 9.0* 04/01/2015   HCT 29.6* 04/01/2015   MCV 94.8 04/01/2015   PLT 286 04/01/2015     Recent Labs Lab 04/01/15 0707  NA 140  K 3.9  CL 100*  CO2 29  BUN 66*  CREATININE 1.62*  CALCIUM 8.2*  PROT 7.0  BILITOT 0.6  ALKPHOS 98  ALT 15  AST 18  GLUCOSE 371*   No results found for: CHOL, HDL, LDLCALC, TRIG No results found for: DDIMER  Radiology/Studies:  Dg Chest Portable 1 View  04/01/2015   CLINICAL DATA:  Respiratory distress.  EXAM: PORTABLE CHEST - 1 VIEW  COMPARISON:  03/04/2015.  FINDINGS: Cardiomegaly with aortic atherosclerosis persists. There is mild vascular congestion and BILATERAL pleural effusions consistent with early CHF. Compared with priors, the degree of failure is not as severe. Chronic osseous changes are stable.  IMPRESSION: Early congestive failure.   Electronically Signed   By: Davonna Belling M.D.   On: 04/01/2015 07:51    EKG: sinus tachycardia, 120 bpm, baseline artifact, wandering baseline, right axis deviation, bigeminy with PACs, poor R wave progression, 1 mm lateral st depression   Weights: Filed Weights   04/01/15 0704 04/01/15 1050  Weight: 116 lb (52.617 kg) 109 lb 12.8 oz (49.805 kg)     Physical Exam: Blood pressure 153/80, pulse 55, temperature 98.5 F (36.9 C), temperature source Oral, resp. rate 12, height 5' (1.524 m), weight 109 lb 12.8 oz (49.805 kg), SpO2 99 %. Body mass index is 21.44 kg/(m^2). General: Well developed, well nourished, in no acute distress. Head: Normocephalic, atraumatic, sclera non-icteric, no xanthomas, nares are without discharge.  Neck: Negative for  carotid bruits. JVD not elevated. Lungs: Decreased breath sounds bilaterally with faint wheezing and crackles at the bilateral bases. Breathing is unlabored. Heart: Sinus bradycardia with S1 S2. 2/6 systolic murmurs at apex. No rubs or gallops appreciated. Abdomen: Soft, non-tender, non-distended with normoactive bowel sounds. No hepatomegaly. No rebound/guarding. No obvious abdominal masses. Msk:  Strength and tone appear normal for age. Extremities: No clubbing or cyanosis. No edema. Ecchymosis along the bilateral distal lower extremities, dressing in place.  Neuro: Alert and oriented X 3. No facial asymmetry. No focal deficit. Moves all extremities spontaneously. Psych:  Responds to questions appropriately, with some dementia, normal affect.    Assessment and Plan:  81 y.o. female who was recently admitted to [facility] 02/2015 after suffering a fall and fracturing her left humerus in the setting of increased weakness. She has history of reported PAF, not on anticoagulation and no documented EKGs showing a-fib, chronic systolic CHF, COPD, CKD stage IV, IDDM, dementia, and anemia who presented to [facility] on 5/3 after developing acute respiratory distress.  1. Acute respiratory failure  suspect acute on chronic systolic CHF: -Gentle diuresis with IV Lasix 40 mg bid given CXR findings of early CHF, monitor SCr -Decrease Lopresor to 12.5 mg bid given bradycardia currently  -Recent echo 02/2015 with EF 40-45%, PASP 43 mm Hg, will not repeat at this time -Continue to evaluate for other potential etiologies including possible PNA given WBC count of 21.7 upon admission  2. Elevated troponin: -Mildly elevated at 0.05 -Likely demand ischemia in the setting of #1 and CKD -Previous admission she was deemed to be not an invasive candidate given her dementia, advanced renal insufficiency and anemia -Continue medical  management with aspirin and simvastatin   3. Leukocytosis: -Admitted with WBC count of 21.7 as  above -Etiologies include possible PNA and UTI -On Levaquin -Blood cultures have been drawn  4. Acute on CKD stage IV: -Baseline SCr 1.4, currently 1.62 -Gentle diuresis as above -Avoid ACEi and ARBs given renal insufficiency  5. COPD: -Home home O2 -Off BiPAP currently -Continue O2 via nasal cannula   6. IDDM: -SSI per IM  7. Left arm fracture: -In the setting of increased weakness -Continue current treatment as advised by treating team  8. Dispo: -She appears too weak for independent living as her daughter would like -She is a DNR    Valaria Good 04/01/2015, 12:31 PM    Attending addendum: Patient seen and examined. Agree with above EKG on arrival shows sinus tachycardia with rate 120 bpm, PVCs in a bigeminal pattern, Review of tele showing short runs of NSVT, frequent PVCs Presentation is concerning for arrhythmia as patient had acute onset of SOB early this AM,  Consistent with flash pulmonary edema. BNP is actually lower than prior admission last month, Daughter reports [facility] had been aggressive with diuresis while she was there. She has only been home for one day, and does not have a high fluid intake. This PM on exam with no complaints or cough making URI/bronchitis/PNA less concerning,  Daughter reports 2 other episodes of acute SOB at rehab, "not as bad" ----would closely monitor tele for arrhythmia ----consider increasing metoprolol dose if tolerated. ----will likely need to arrange an outpt 30 day monitor For significant arrythmia such as atrial fibrillation or sustained VT, or even frequent PVCs (as seen with bigeminal pattern) Consider amiodarone po BID -She will need close outpt followup in clinic   [facility]  M.D

## 2015-04-01 NOTE — Progress Notes (Signed)
Inpatient Diabetes Program Recommendations  AACE/ADA: New Consensus Statement on Inpatient Glycemic Control (2013)  Target Ranges:  Prepandial:   less than 140 mg/dL      Peak postprandial:   less than 180 mg/dL (1-2 hours)      Critically ill patients:  140 - 180 mg/dL   Results for Laura Jefferson, Laura Jefferson (MRN [id number]) as of 04/01/2015 11:02  Ref. Range 04/01/2015 07:07  Glucose Latest Ref Range: 65-99 mg/dL 865 (H)    Reason for assessment: elevated lab glucose  Diabetes history: Type 2 Outpatient Diabetes medications: Glipizide 2.5mg  bid, Novolog sliding scale insulin with meals Current orders for Inpatient glycemic control: Glipizide 2.5mg  bid, Lantus 10 units qday, Novolog resistant correction scale insulin tid with meals   Given this patient's poor renal function, weight and age, please consider stopping the Glipizide and decreasing the Novolog correction to the sensitive correction scale tid.   Susette Racer, RN, BA, MHA, CDE Diabetes Coordinator Inpatient Diabetes Program  [phone] (Team Pager) [phone] [facility]) 04/01/2015 11:06 AM

## 2015-04-01 NOTE — ED Notes (Signed)
Patient is resting comfortably. Daughter at bedside. No acute distress noted.

## 2015-04-01 NOTE — H&P (Signed)
[facility] - [place] at [facility]   PATIENT NAME: Laura Jefferson    MR#:  [id number]  DATE OF BIRTH:  December 17, 1924  DATE OF ADMISSION:  04/01/2015  PRIMARY CARE PHYSICIAN: Marcine Matar, MD   REQUESTING/REFERRING PHYSICIAN:   CHIEF COMPLAINT:   Chief Complaint  Patient presents with  . Respiratory Distress    HISTORY OF PRESENT ILLNESS: Laura Jefferson  is a 81 y.o. female who was recently hospitalized after fall, and  Noted to have fall, left humerus fx, arf, acute systolic chf and NSTMI.  Pt was then d/c to rehab.  In rehab pt was doing better then d/c to home yesterday.  Pt brought back to hospital by daughter due to developing acute respiratory distress this am.  Pt initially had to be placed on bipap, now on nasal canula.  Pt has dementia and unable to provide hx. Daughter provided the history.  She has not had any fever or chill at home. PAST MEDICAL HISTORY:   Past Medical History  Diagnosis Date  . Muscle weakness   . Non-ST elevated myocardial infarction   . Humerus fracture   . Dysphagia   . Anemia   . Paroxysmal a-fib   . CHF (congestive heart failure)   . COPD (chronic obstructive pulmonary disease)   . Hypertensive chronic kidney disease   . Diabetes mellitus without complication   . PVD (peripheral vascular disease)   . Dementia   . Anxiety disorder     PAST SURGICAL HISTORY:  Past Surgical History  Procedure Laterality Date  . Arm surgery    . Peripheral arterial stent graft      SOCIAL HISTORY:  History  Substance Use Topics  . Smoking status: Former Smoker -- 70 years    Types: Cigarettes    Quit date: 02/24/2015  . Smokeless tobacco: Not on file  . Alcohol Use: No    FAMILY HISTORY:  Family History  Problem Relation Age of Onset  . CAD Son     DRUG ALLERGIES: No Known Allergies  REVIEW OF SYSTEMS:  Unable to provide due to her dementia   MEDICATIONS AT HOME:  Prior to Admission medications   Medication  Sig Start Date End Date Taking? Authorizing Provider  aspirin 81 MG tablet Take 81 mg by mouth daily.   Yes Historical Provider, MD  folic acid (FOLVITE) 400 MCG tablet Take 400 mcg by mouth daily.   Yes Historical Provider, MD  furosemide (LASIX) 40 MG tablet Take 60 mg by mouth daily.   Yes Historical Provider, MD  glipiZIDE (GLUCOTROL XL) 2.5 MG 24 hr tablet Take 2.5 mg by mouth daily with breakfast.   Yes Historical Provider, MD  GLUCERNA (GLUCERNA) LIQD Take 237 mLs by mouth 2 (two) times daily between meals.   Yes Historical Provider, MD  metoprolol tartrate (LOPRESSOR) 25 MG tablet Take 25 mg by mouth 2 (two) times daily.   Yes Historical Provider, MD  nicotine (NICODERM CQ - DOSED IN MG/24 HR) 7 mg/24hr patch Place 7 mg onto the skin daily.   Yes Historical Provider, MD  PARoxetine (PAXIL) 20 MG tablet Take 20 mg by mouth daily.   Yes Historical Provider, MD  vitamin B-12 (CYANOCOBALAMIN) 1000 MCG tablet Take 1,000 mcg by mouth daily.   Yes Historical Provider, MD  Amino Acids-Protein Hydrolys (FEEDING SUPPLEMENT, PRO-STAT SUGAR FREE 64,) LIQD Take 30 mLs by mouth 2 (two) times daily between meals.    Historical Provider, MD  amoxicillin-clavulanate (  AUGMENTIN) 875-125 MG per tablet Take 1 tablet by mouth 2 (two) times daily.    Historical Provider, MD  Cholecalciferol (VITAMIN D) 2000 UNITS tablet Take 2,000 Units by mouth daily.    Historical Provider, MD  docusate sodium (COLACE) 100 MG capsule Take 100 mg by mouth 2 (two) times daily as needed for mild constipation.    Historical Provider, MD  HYDROcodone-acetaminophen (NORCO/VICODIN) 5-325 MG per tablet Take 1 tablet by mouth every 6 (six) hours as needed for moderate pain.    Historical Provider, MD  insulin aspart (NOVOLOG) 100 UNIT/ML injection Inject into the skin as directed.    Historical Provider, MD  ipratropium-albuterol (DUONEB) 0.5-2.5 (3) MG/3ML SOLN Take 3 mLs by nebulization every 6 (six) hours as needed.    Historical  Provider, MD  OXYGEN Inhale 2 L into the lungs as needed.    Historical Provider, MD  senna (SENOKOT) 8.6 MG TABS tablet Take 1 tablet by mouth 2 (two) times daily as needed for mild constipation.    Historical Provider, MD  simvastatin (ZOCOR) 20 MG tablet Take 20 mg by mouth daily.    Historical Provider, MD  Tiotropium Bromide Monohydrate 2.5 MCG/ACT AERS Inhale into the lungs daily.    Historical Provider, MD      PHYSICAL EXAMINATION:   VITAL SIGNS: Blood pressure 129/57, pulse 79, temperature 96.6 F (35.9 C), temperature source Axillary, resp. rate 27, height 5' (1.524 m), weight 52.617 kg (116 lb), SpO2 100 %.  GENERAL:  81 y.o.-year-old patient lying in the bed , appears chronically ill.  EYES: Pupils equal, round, reactive to light and accommodation. No scleral icterus. Extraocular muscles intact.  HEENT: Head atraumatic, normocephalic. Oropharynx and nasopharynx clear.  NECK:  Supple, no jugular venous distention. No thyroid enlargement, no tenderness.  LUNGS: Has B/L cracles at the bases, no accesory muscle use, no wheezing or rhonchi CARDIOVASCULAR: S1, S2 normal. No murmurs, rubs, or gallops.  ABDOMEN: Soft, nontender, nondistended. Bowel sounds present. No organomegaly or mass.  EXTREMITIES: No pedal edema, cyanosis, or clubbing. Mulitple brusing on lower ext, she has dressing in place right foot due to skin tear NEUROLOGIC: Cranial nerves II through XII are intact. Muscle strength 5/5 in all extremities. Sensation intact. Gait not checked.  PSYCHIATRIC: The patient is awake but not oriented to place or time.   SKIN: No obvious rash, lesion, or ulcer.   LABORATORY PANEL:   CBC  Recent Labs Lab 04/01/15 0707  WBC 21.7*  HGB 9.0*  HCT 29.6*  PLT 286  MCV 94.8  MCH 28.9  MCHC 30.5*  RDW 15.8*   ------------------------------------------------------------------------------------------------------------------  Chemistries   Recent Labs Lab 04/01/15 0707  NA  140  K 3.9  CL 100*  CO2 29  GLUCOSE 371*  BUN 66*  CREATININE 1.62*  CALCIUM 8.2*  AST 18  ALT 15  ALKPHOS 98  BILITOT 0.6   ------------------------------------------------------------------------------------------------------------------ estimated creatinine clearance is 17.6 mL/min (by C-G formula based on Cr of 1.62). ------------------------------------------------------------------------------------------------------------------ No results for input(s): TSH, T4TOTAL, T3FREE, THYROIDAB in the last 72 hours.  Invalid input(s): FREET3   Coagulation profile No results for input(s): INR, PROTIME in the last 168 hours. ------------------------------------------------------------------------------------------------------------------- No results for input(s): DDIMER in the last 72 hours. -------------------------------------------------------------------------------------------------------------------  Cardiac Enzymes  Recent Labs Lab 04/01/15 0707  TROPONINI 0.05*   ------------------------------------------------------------------------------------------------------------------ Invalid input(s): POCBNP  ---------------------------------------------------------------------------------------------------------------  Urinalysis No results found for: COLORURINE, APPEARANCEUR, LABSPEC, PHURINE, GLUCOSEU, HGBUR, BILIRUBINUR, KETONESUR, PROTEINUR, UROBILINOGEN, NITRITE, LEUKOCYTESUR   RADIOLOGY: Dg Chest  Portable 1 View  04/01/2015   CLINICAL DATA:  Respiratory distress.  EXAM: PORTABLE CHEST - 1 VIEW  COMPARISON:  03/04/2015.  FINDINGS: Cardiomegaly with aortic atherosclerosis persists. There is mild vascular congestion and BILATERAL pleural effusions consistent with early CHF. Compared with priors, the degree of failure is not as severe. Chronic osseous changes are stable.  IMPRESSION: Early congestive failure.   Electronically Signed   By: Davonna Belling M.D.   On: 04/01/2015  07:51    EKG: Orders placed or performed in visit on 03/27/15  . EKG 12-Lead    IMPRESSION AND PLAN: Patient is a 81 year old white female with recent hospitalization for congestive heart failure and acute renal failure and non-ST MI presents back with acute respiratory failure.  #1. Acute respiratory failure; due to acute on chronic systolic CHF exacerbation. At this time will go ahead and start patient on IV Lasix twice a day continue her metoprolol. Since she had a recent echo cardiogram done and I will not repeat her echocardiogram.  #2. Leukocytosis; suspected underlying infection differentials include pneumonia, urinary tract infection I will place on empiric Levaquin obtained blood cultures urinalysis.  #3. Chronic renal failure; renal function appears to be stable compared to her previous hospitalization will monitor renal function. Avoid any nephrotoxins  #4 diabetes; will place her on sliding scale insulin, start her on low-dose Lantus.  #5 COPD; there is currently no evidence of acute exacerbation we will continue her Spiriva as taking at home.  #6. Left arm fracture continue her sling as doing at home.  #7. CODE STATUS; patient is DO NOT RESUSCITATE confirmed with the daughter prognosis poor if does not improve would consider palliative care consult  All the records are reviewed and case discussed with ED provider. Management plans discussed with the patient, family and they are in agreement.  CODE STATUS: Advance Directive Documentation        Most Recent Value   Type of Advance Directive  Healthcare Power of Attorney, Living will   Pre-existing out of facility DNR order (yellow form or pink MOST form)     "MOST" Form in Place?         TOTAL TIME TAKING CARE OF THIS PATIENT: 50 minutes spent .    Auburn Bilberry M.D on 04/01/2015 at 9:35 AM  Between 7am to 6pm - Pager - [phone]  After 6pm go to [email or web address] - password [email or web address] [facility]  [place]  [facility]  Office  [phone]  CC: Primary care physician; Marcine Matar, MD

## 2015-04-01 NOTE — ED Notes (Signed)
MD at bedside. 

## 2015-04-02 DIAGNOSIS — J449 Chronic obstructive pulmonary disease, unspecified: Secondary | ICD-10-CM | POA: Diagnosis present

## 2015-04-02 DIAGNOSIS — I129 Hypertensive chronic kidney disease with stage 1 through stage 4 chronic kidney disease, or unspecified chronic kidney disease: Secondary | ICD-10-CM | POA: Diagnosis present

## 2015-04-02 DIAGNOSIS — I4729 Other ventricular tachycardia: Secondary | ICD-10-CM

## 2015-04-02 DIAGNOSIS — I5023 Acute on chronic systolic (congestive) heart failure: Secondary | ICD-10-CM | POA: Diagnosis not present

## 2015-04-02 DIAGNOSIS — I472 Ventricular tachycardia: Secondary | ICD-10-CM

## 2015-04-02 DIAGNOSIS — I5021 Acute systolic (congestive) heart failure: Secondary | ICD-10-CM

## 2015-04-02 DIAGNOSIS — D72829 Elevated white blood cell count, unspecified: Secondary | ICD-10-CM | POA: Diagnosis present

## 2015-04-02 DIAGNOSIS — I5042 Chronic combined systolic (congestive) and diastolic (congestive) heart failure: Secondary | ICD-10-CM | POA: Diagnosis present

## 2015-04-02 DIAGNOSIS — I480 Paroxysmal atrial fibrillation: Secondary | ICD-10-CM | POA: Diagnosis present

## 2015-04-02 DIAGNOSIS — I248 Other forms of acute ischemic heart disease: Secondary | ICD-10-CM | POA: Diagnosis present

## 2015-04-02 DIAGNOSIS — J441 Chronic obstructive pulmonary disease with (acute) exacerbation: Secondary | ICD-10-CM | POA: Diagnosis present

## 2015-04-02 LAB — CBC
HCT: 23.7 % — ABNORMAL LOW (ref 35.0–47.0)
HEMOGLOBIN: 7.6 g/dL — AB (ref 12.0–16.0)
MCH: 29.7 pg (ref 26.0–34.0)
MCHC: 31.9 g/dL — ABNORMAL LOW (ref 32.0–36.0)
MCV: 93.1 fL (ref 80.0–100.0)
PLATELETS: 194 10*3/uL (ref 150–440)
RBC: 2.55 MIL/uL — ABNORMAL LOW (ref 3.80–5.20)
RDW: 15.5 % — ABNORMAL HIGH (ref 11.5–14.5)
WBC: 9.9 10*3/uL (ref 3.6–11.0)

## 2015-04-02 LAB — BASIC METABOLIC PANEL
Anion gap: 9 (ref 5–15)
BUN: 66 mg/dL — ABNORMAL HIGH (ref 6–20)
CALCIUM: 8.2 mg/dL — AB (ref 8.9–10.3)
CO2: 31 mmol/L (ref 22–32)
CREATININE: 1.79 mg/dL — AB (ref 0.44–1.00)
Chloride: 100 mmol/L — ABNORMAL LOW (ref 101–111)
GFR calc non Af Amer: 24 mL/min — ABNORMAL LOW (ref >60)
GFR, EST AFRICAN AMERICAN: 28 mL/min — AB (ref >60)
Glucose, Bld: 101 mg/dL — ABNORMAL HIGH (ref 65–99)
POTASSIUM: 3.5 mmol/L (ref 3.5–5.1)
Sodium: 140 mmol/L (ref 135–145)

## 2015-04-02 LAB — GLUCOSE, CAPILLARY
GLUCOSE-CAPILLARY: 158 mg/dL — AB (ref 70–99)
Glucose-Capillary: 141 mg/dL — ABNORMAL HIGH (ref 70–99)
Glucose-Capillary: 156 mg/dL — ABNORMAL HIGH (ref 70–99)
Glucose-Capillary: 118 mg/dL — ABNORMAL HIGH (ref 70–99)

## 2015-04-02 MED ORDER — LEVOFLOXACIN 250 MG PO TABS
250.0000 mg | ORAL_TABLET | ORAL | Status: DC
  Administered 2015-04-03 – 2015-04-09 (×4): 250 mg via ORAL
  Filled 2015-04-02 (×4): qty 1

## 2015-04-02 MED ORDER — FUROSEMIDE 40 MG PO TABS
40.0000 mg | ORAL_TABLET | Freq: Two times a day (BID) | ORAL | Status: DC
  Administered 2015-04-02 – 2015-04-03 (×2): 40 mg via ORAL
  Filled 2015-04-02 (×2): qty 1

## 2015-04-02 NOTE — Progress Notes (Addendum)
Alert and oriented. Left arm in sling. Complained of pain once during the night, norco given. No other complaints. Up with heavy assist. Incontinent at times. ON IV lasix and antibiotics.

## 2015-04-02 NOTE — Progress Notes (Signed)
Patient: Laura Jefferson / Admit Date: 04/01/2015 / Date of Encounter: 04/02/2015, 10:28 AM   Subjective: She reports improved SOB. No chest pain. She denies any palpitations, lightheadedness or dizziness. No presyncope or syncope. No unsteadiness upon standing. She denies any decreased appetite.   Review of Systems: Review of Systems  Constitutional: Positive for malaise/fatigue. Negative for fever, chills, weight loss and diaphoresis.  Respiratory: Positive for shortness of breath. Negative for cough, hemoptysis and wheezing.        Improved SOB  Cardiovascular: Negative for chest pain, palpitations, orthopnea, leg swelling and PND.  Neurological: Positive for weakness.    Objective: Telemetry: NSR with frequent ventricular bigeminy and occasional ventricular trigeminy.  Physical Exam: Blood pressure 129/51, pulse 67, temperature 98.5 F (36.9 C), temperature source Oral, resp. rate 22, height 5' (1.524 m), weight 107 lb 14.4 oz (48.943 kg), SpO2 100 %. Body mass index is 21.07 kg/(m^2). General: Well developed, well nourished, in no acute distress. Head: Normocephalic, atraumatic, sclera non-icteric, no xanthomas, nares are without discharge. Neck: Negative for carotid bruits. JVP not elevated. Lungs: Improved breath sounds. Moving air well bilaterally without wheezes, rales, or rhonchi. Breathing is unlabored. On O2 via [place] at 3L. Heart: RRR S1 S2 without murmurs, rubs, or gallops.  Abdomen: Soft, non-tender, non-distended with normoactive bowel sounds. No rebound/guarding. Extremities: No clubbing or cyanosis. No edema. Chronic venous stasis bilaterally. Neuro: Alert and oriented X 3. Moves all extremities spontaneously. Psych:  Responds to questions appropriately with a normal affect, some baseline dementia.   Intake/Output Summary (Last 24 hours) at 04/02/15 1028 Last data filed at 04/02/15 1002  Gross per 24 hour  Intake    366 ml  Output    400 ml  Net    -34 ml     Inpatient Medications:  . antiseptic oral rinse  7 mL Mouth Rinse BID  . aspirin EC  81 mg Oral Daily  . enoxaparin (LOVENOX) injection  30 mg Subcutaneous Q24H  . feeding supplement (PRO-STAT SUGAR FREE 64)  30 mL Oral BID BM  . folic acid  0.5 mg Oral Daily  . furosemide  40 mg Intravenous BID  . glipiZIDE  2.5 mg Oral Q breakfast  . GLUCERNA  237 mL Oral BID BM  . insulin aspart  0-20 Units Subcutaneous TID WC  . insulin glargine  10 Units Subcutaneous Daily  . [START ON 04/03/2015] levofloxacin (LEVAQUIN) IV  250 mg Intravenous Q48H  . metoprolol tartrate  12.5 mg Oral BID  . nicotine  7 mg Transdermal Daily  . PARoxetine  20 mg Oral Daily  . simvastatin  20 mg Oral Daily  . sodium chloride  3 mL Intravenous Q12H  . tiotropium  18 mcg Inhalation Daily  . vitamin B-12  1,000 mcg Oral Daily  . Vitamin D  2,000 Units Oral Daily   Infusions:    Labs:  Recent Labs  04/01/15 0707 04/02/15 0353  NA 140 140  K 3.9 3.5  CL 100* 100*  CO2 29 31  GLUCOSE 371* 101*  BUN 66* 66*  CREATININE 1.62* 1.79*  CALCIUM 8.2* 8.2*    Recent Labs  04/01/15 0707  AST 18  ALT 15  ALKPHOS 98  BILITOT 0.6  PROT 7.0  ALBUMIN 3.0*    Recent Labs  04/01/15 0707 04/02/15 0353  WBC 21.7* 9.9  HGB 9.0* 7.6*  HCT 29.6* 23.7*  MCV 94.8 93.1  PLT 286 194    Recent Labs  04/01/15 0707  TROPONINI 0.05*   Invalid input(s): POCBNP No results for input(s): HGBA1C in the last 72 hours.   Weights: Filed Weights   04/01/15 0704 04/01/15 1050 04/02/15 0338  Weight: 116 lb (52.617 kg) 109 lb 12.8 oz (49.805 kg) 107 lb 14.4 oz (48.943 kg)     Radiology/Studies:  Dg Chest Portable 1 View  04/01/2015   CLINICAL DATA:  Respiratory distress.  EXAM: PORTABLE CHEST - 1 VIEW  COMPARISON:  03/04/2015.  FINDINGS: Cardiomegaly with aortic atherosclerosis persists. There is mild vascular congestion and BILATERAL pleural effusions consistent with early CHF. Compared with priors, the degree  of failure is not as severe. Chronic osseous changes are stable.  IMPRESSION: Early congestive failure.   Electronically Signed   By: Davonna Belling M.D.   On: 04/01/2015 07:51     Assessment and Plan  1. Acute respiratory failure: -Suspect acute on chronic systolic CHF/flash pulmonary edema, much improved today -Transition to po Lasix 40 mg bid this PM with a goal discharge Lasix of 20 mg bid (not on Lasix at home previously), with prn extra 20 mg for weight gain of 3 pounds or SOB -She will need close outpatient follow up and BMET in 1 week -Continue Lopresor to 12.5 mg bid  -Recent echo 02/2015 with EF 40-45%, PASP 43 mm Hg, will not repeat at this time  2. Elevated troponin: -Mildly elevated at 0.05 -Likely demand ischemia in the setting of #1 and CKD -Previous admission she was deemed to be not an invasive candidate given her dementia, advanced renal insufficiency and anemia -Continue medical management with aspirin and simvastatin   3. Ventricular bigeminy/trigeminy: -Likely in the setting of myocardial stretch, may decrease in frequency with diuresis and decreased myocardial stretch  -Short runs of NSVT on telemetry previously -Asymptomatic -Would continue Lopressor at 12.5 mg bid currently, could advance at later date as indicated pending HR and blood pressure -No indication for amiodarone at this time given her asymptomatic nature  -Could plan for outpatient 30 day event monitor at discharge if she or her daughter would like     4. Leukocytosis: -Admitted with WBC count of 21.7 as above -Likely inflammatory in the setting of #1, resolved  -On Levaquin -Blood cultures have been drawn  5. Acute on CKD stage IV: -Baseline SCr 1.4, currently 1.62 -Gentle diuresis as above -Avoid ACEi and ARBs given renal insufficiency  6. COPD: -Home home O2 -Off BiPAP currently -Continue O2 via nasal cannula   7. IDDM: -SSI per IM  8. Left arm fracture: -In the setting of increased  weakness -Continue current treatment as advised by treating team  9. Dispo: -She appears too weak for independent living as her daughter would like -She is a DNR   Signed, Eula Listen, PA-C 04/02/2015 10:41 AM

## 2015-04-02 NOTE — Clinical Social Work Note (Signed)
Clinical Social Work Assessment  Patient Details  Name: SHENETHA GELLNER MRN: [id number] Date of Birth: [birth year]  Date of referral:  04/02/15               Reason for consult:  Other (Comment Required) (Possible Facility Placement)                Permission sought to share information with:    Permission granted to share information::  Yes, Verbal Permission Granted (Verbal permission was given to CSW to speak with pt's family)  Name::        Agency::     Relationship::     Contact Information:     Housing/Transportation Living arrangements for the past 2 months:   (pt currently lives with daughter.  ) Source of Information:  Medical Team Patient Interpreter Needed:  None Criminal Activity/Legal Involvement Pertinent to Current Situation/Hospitalization:  No - Comment as needed Significant Relationships:  Adult Children Lives with:  Adult Children Do you feel safe going back to the place where you live?   (pt not oriented enough to tell CSW much information.) Need for family participation in patient care:  Yes (Comment)  Care giving concerns:  Current availability for family support.   Social Worker assessment / plan:  CSW has attempted to contact pt's family to confirm that they will be able to care for pt at home.  Employment status:  Retired Health and safety inspector:  Medicare PT Recommendations:  Home with Home Health Information / Referral to community resources:     Patient/Family's Response to care:  Pt thanked CSW for speaking with her.    Patient/Family's Understanding of and Emotional Response to Diagnosis, Current Treatment, and Prognosis:  Unable to assess at this time  Emotional Assessment Appearance:    Attitude/Demeanor/Rapport:    Affect (typically observed):    Orientation:    Alcohol / Substance use:    Psych involvement (Current and /or in the community):  No (Comment)  Discharge Needs  Concerns to be addressed:   (CSW in the process of confirming  family support for pt ) Readmission within the last 30 days:    Current discharge risk:    Barriers to Discharge:      Chauncy Passy, LCSW 04/02/2015, 12:33 PM

## 2015-04-02 NOTE — Progress Notes (Signed)
Alert, oriented at times with some confusion. Vitals stable. No complaints from patient. Wound consult put in by MD today for patient's legs. Will continue to monitor.

## 2015-04-02 NOTE — Care Management Note (Signed)
Patient discharged from skilled nursing and readmitted to [facility] within a couple of days.  Patient is not able to participate with assessment.  She does not know if she has home 02.  Have left voicemail message for patient's daughter [name]

## 2015-04-02 NOTE — Progress Notes (Signed)
Initial Nutrition Assessment  DOCUMENTATION CODES:  INTERVENTION: Medical Nutrition Supplement: Continue Glucerna shake BID Meals and Snacks: Cater to patient preferences   NUTRITION DIAGNOSIS:  No nutrition concerns at this time  GOAL: Energy Intake: Patient will meet greater than or equal to 90% of their needs  MONITOR:  PO intake, Supplement acceptance, Weight trends, Skin, I & O's  REASON FOR ASSESSMENT:  Other (Comment)    ASSESSMENT: Pt admitted with respiratory distress and acute CHF PMH: Dementia, Dysphagia, CHF, COPD, DM  Typical Fluid/ Food Intake: 25-100% of meals documented per I/O today.  Meal/ Snack Patterns: per daughter, patient has a good appetite Supplements: Glucerna BID  Labs:  Electrolyte and Renal Profile:    Recent Labs Lab 04/01/15 0707 04/02/15 0353  BUN 66* 66*  CREATININE 1.62* 1.79*  NA 140 140  K 3.9 3.5   Glucose Profile:  Recent Labs  04/01/15 2001 04/02/15 0745 04/02/15 1117  GLUCAP 73 141* 156*     Protein Profile:  Recent Labs Lab 04/01/15 0707  ALBUMIN 3.0*    Meds: B-12, Vit D, Folic Acid, Zocor  UOP: Incontinent  Weight Changes: Unable to obtain from patient; reviewed recent hospital records from April 2016. Wt= 110#. Current weight represents a 3# weight loss x 1 month (2.7%)- not significant.   Height:  Ht Readings from Last 1 Encounters:  04/01/15 5' (1.524 m)    Weight:  Wt Readings from Last 1 Encounters:  04/02/15 107 lb 14.4 oz (48.943 kg)    Ideal Body Weight:     Wt Readings from Last 10 Encounters:  04/02/15 107 lb 14.4 oz (48.943 kg)  03/27/15 112 lb 7 oz (51.001 kg)    BMI:  Body mass index is 21.07 kg/(m^2).  Skin:  Reviewed, no issues  Diet Order:  Diet heart healthy/carb modified Room service appropriate?: Yes; Fluid consistency:: Thin  EDUCATION NEEDS:  No education needs identified at this time   Intake/Output Summary (Last 24 hours) at 04/02/15 1606 Last data  filed at 04/02/15 1300  Gross per 24 hour  Intake    603 ml  Output    900 ml  Net   -297 ml    Last BM: Reviewed   Joeseph Amor, RDN [phone]  LOW Care Level

## 2015-04-02 NOTE — Progress Notes (Signed)
[facility] - [place] at [facility]   PATIENT NAME: Alvania Bille    MR#:  [id number]  DATE OF BIRTH:  06-Feb-1925  SUBJECTIVE:  CHIEF COMPLAINT:   Chief Complaint  Patient presents with  . Respiratory Distress   Patient feels fine this morning. she does not have any chest pain or difficulty breathing, daughter not at bedside.  REVIEW OF SYSTEMS:  CONSTITUTIONAL: No fever, fatigue or weakness.  RESPIRATORY: No cough, minimal shortness of breath, wheezing or hemoptysis.  CARDIOVASCULAR: No chest pain, orthopnea, edema.  GASTROINTESTINAL: No nausea, vomiting, diarrhea or abdominal pain.  GENITOURINARY: No dysuria, hematuria.  NEUROLOGIC: No tingling, numbness, weakness.  PSYCHIATRY: No anxiety or depression.   DRUG ALLERGIES:  No Known Allergies  VITALS:  Blood pressure 129/51, pulse 67, temperature 98.5 F (36.9 C), temperature source Oral, resp. rate 22, height 5' (1.524 m), weight 48.943 kg (107 lb 14.4 oz), SpO2 100 %.  PHYSICAL EXAMINATION:  GENERAL:  81 y.o.-year-old patient lying in the bed with no acute distress.  EYES: Pupils equal, round, reactive to light and accommodation. No scleral icterus. Extraocular muscles intact.  HEENT: Head atraumatic, normocephalic. Oropharynx and nasopharynx clear.  NECK:  Supple, no jugular venous distention. No thyroid enlargement, no tenderness.  LUNGS: Normal breath sounds bilaterally, no wheezing, fine  Rales at the bases. No use of accessory muscles of respiration.  CARDIOVASCULAR: S1, S2 normal. 3/6 systolic murmur present, no gallops  ABDOMEN: Soft, nontender, nondistended. Bowel sounds present. No organomegaly or mass.  EXTREMITIES: No pedal edema, cyanosis, or clubbing. Wounds with dressings noted on right shin and calf NEUROLOGIC: Cranial nerves II through XII are intact. Muscle strength intact in all extremities. Left arm in sling. Sensation intact. Gait not checked.  PSYCHIATRIC: The patient is alert  and oriented x 2. Has dementia at baseline. SKIN: No obvious rash, lesion, or ulcer.    LABORATORY PANEL:   CBC  Recent Labs Lab 04/02/15 0353  WBC 9.9  HGB 7.6*  HCT 23.7*  PLT 194   ------------------------------------------------------------------------------------------------------------------  Chemistries   Recent Labs Lab 04/01/15 0707 04/02/15 0353  NA 140 140  K 3.9 3.5  CL 100* 100*  CO2 29 31  GLUCOSE 371* 101*  BUN 66* 66*  CREATININE 1.62* 1.79*  CALCIUM 8.2* 8.2*  AST 18  --   ALT 15  --   ALKPHOS 98  --   BILITOT 0.6  --    ------------------------------------------------------------------------------------------------------------------  Cardiac Enzymes  Recent Labs Lab 04/01/15 0707  TROPONINI 0.05*   ------------------------------------------------------------------------------------------------------------------  RADIOLOGY:  Dg Chest Portable 1 View  04/01/2015   CLINICAL DATA:  Respiratory distress.  EXAM: PORTABLE CHEST - 1 VIEW  COMPARISON:  03/04/2015.  FINDINGS: Cardiomegaly with aortic atherosclerosis persists. There is mild vascular congestion and BILATERAL pleural effusions consistent with early CHF. Compared with priors, the degree of failure is not as severe. Chronic osseous changes are stable.  IMPRESSION: Early congestive failure.   Electronically Signed   By: Davonna Belling M.D.   On: 04/01/2015 07:51    EKG:   Orders placed or performed during the hospital encounter of 04/01/15  . EKG 12-Lead  . EKG 12-Lead  . EKG 12-Lead  . EKG 12-Lead    ASSESSMENT AND PLAN:   81 year old female with past medical history significant for congestive heart failure last known ejection fraction of 40%, diabetes mellitus, CK D with baseline creatinine of 1.8, recent and non-ST segment elevation MI, and left humerus fracture was discharged from  rehabilitation to home yesterday presents to the hospital secondary to worsening respiratory  distress.  Point #1 acute on chronic systolic CHF exacerbation-patient started on IV Lasix twice a day and diuresed well breathing is comfortable so we'll change Lasix to oral today. Cardiology has been consulted. The echo was just done last admission in April 2016 and EF is 40-45%. Started on metoprolol low dose. not on any ACEI or ARB due to chronic kidney disease.  #2. Leukocytosis- no evidence of infection, levaquin was added with improvement in wbc. Monitor for now  #3. Anemia- baseline hb around 7, its dropped to 7.6 from 9. Monitor for now. Will transfuse if Hb<7  #4. DM- on lantus, glipizide and ssi  #5. COPD- on 2l oxygen at home, cont. On spiriva- continue  #6. CKD- stable, monitor while being diuresed  #7. DVT prophylaxis- lovenox     All the records are reviewed and case discussed with Care Management/Social Workerr. Management plans discussed with the patient, family and they are in agreement.  CODE STATUS: DNR  TOTAL TIME TAKING CARE OF THIS PATIENT: 30 minutes.   POSSIBLE D/C IN 1-2 DAYS, DEPENDING ON CLINICAL CONDITION.   Enid Baas M.D on 04/02/2015 at 10:27 AM  Between 7am to 6pm - Pager - [phone]  After 6pm go to [email or web address] - password [email or web address] [facility]  [facility]  Office  [phone]  CC: Primary care physician; Marcine Matar, MD

## 2015-04-03 LAB — CBC
HCT: 23.2 % — ABNORMAL LOW (ref 35.0–47.0)
HEMOGLOBIN: 7.4 g/dL — AB (ref 12.0–16.0)
MCH: 29.4 pg (ref 26.0–34.0)
MCHC: 32 g/dL (ref 32.0–36.0)
MCV: 92 fL (ref 80.0–100.0)
Platelets: 184 10*3/uL (ref 150–440)
RBC: 2.52 MIL/uL — ABNORMAL LOW (ref 3.80–5.20)
RDW: 15.2 % — ABNORMAL HIGH (ref 11.5–14.5)
WBC: 7.9 10*3/uL (ref 3.6–11.0)

## 2015-04-03 LAB — BASIC METABOLIC PANEL
Anion gap: 9 (ref 5–15)
BUN: 69 mg/dL — ABNORMAL HIGH (ref 6–20)
CALCIUM: 8.4 mg/dL — AB (ref 8.9–10.3)
CO2: 32 mmol/L (ref 22–32)
Chloride: 100 mmol/L — ABNORMAL LOW (ref 101–111)
Creatinine, Ser: 1.8 mg/dL — ABNORMAL HIGH (ref 0.44–1.00)
GFR calc Af Amer: 28 mL/min — ABNORMAL LOW (ref >60)
GFR calc non Af Amer: 24 mL/min — ABNORMAL LOW (ref >60)
GLUCOSE: 87 mg/dL (ref 65–99)
Potassium: 3.4 mmol/L — ABNORMAL LOW (ref 3.5–5.1)
Sodium: 141 mmol/L (ref 135–145)

## 2015-04-03 LAB — GLUCOSE, CAPILLARY
GLUCOSE-CAPILLARY: 160 mg/dL — AB (ref 70–99)
GLUCOSE-CAPILLARY: 144 mg/dL — AB (ref 70–99)
Glucose-Capillary: 142 mg/dL — ABNORMAL HIGH (ref 70–99)
Glucose-Capillary: 281 mg/dL — ABNORMAL HIGH (ref 70–99)

## 2015-04-03 MED ORDER — FUROSEMIDE 20 MG PO TABS
20.0000 mg | ORAL_TABLET | Freq: Two times a day (BID) | ORAL | Status: DC
  Administered 2015-04-03 – 2015-04-05 (×4): 20 mg via ORAL
  Filled 2015-04-03 (×4): qty 1

## 2015-04-03 MED ORDER — FUROSEMIDE 10 MG/ML IJ SOLN
INTRAMUSCULAR | Status: AC
  Filled 2015-04-03: qty 4

## 2015-04-03 MED ORDER — FUROSEMIDE 10 MG/ML IJ SOLN
40.0000 mg | Freq: Once | INTRAMUSCULAR | Status: AC
  Administered 2015-04-03: 40 mg via INTRAVENOUS

## 2015-04-03 NOTE — Evaluation (Signed)
Called by nursing staff regarding pt increased wob eval at bedside  rr 30s, 90%, diffuse crackle Given lasix 40mg  iv now, place foley as unable to accurately assess i/o bipap

## 2015-04-03 NOTE — Consult Note (Signed)
WOC wound consult note Reason for Consult: Known PVD.  Ecchymosis abrasions noted to bilateral lower legs, present on admission. Fall prior to admission.  Wound type: Bruising.  Known vascular insufficiency.  Recent fall.  Pressure Ulcer POA: n/a  Traumatic wounds Measurement: 5 cm x 2 cm nonintact lesion to right dorsal calf.  Bruising to bilateral lower extremities.  Silicone border dressing in place.  WIll continue this nonadherent topical therapy.  Wound bed: 25% scabbed, 25% pale pink wound bed.  Drainage (amount, consistency, odor) Scant serosanguinous  No odor Periwound: Bruising Dressing procedure/placement/frequency: Cleanse right dorsal calf lesions with soap and water and pat gently dry.  Apply Allevyn silicone border foam dressing.  Change every 3 days and PRN soilage.  Will not follow at this time.  Please re-consult if needed.  Maple Hudson RN BSN CWON Pager [phone]

## 2015-04-03 NOTE — Progress Notes (Signed)
PT Cancellation Note  Patient Details Name: Laura Jefferson MRN: [id number] DOB: 1925/03/24   Cancelled Treatment:    Reason Eval/Treat Not Completed: Other (comment) (Multiple conflicts with bath, meal and other staff seeing pt)   Ivar Drape 04/03/2015, 12:37 PM   Samul Dada, PT MS Acute Rehab Dept. Number: [phone]

## 2015-04-03 NOTE — Clinical Social Work Note (Signed)
CSW spoke with pt and her daughter Laura Jefferson.  She confirmed that she, or someone else in the family would be able to stay with the pt at home 24hrs.  CSW has notified RNCM.  CSW signing off

## 2015-04-03 NOTE — Progress Notes (Signed)
Notified Dr. Clint Guy of patient's new onset shortness of breath and respiratory distress. Resp therapy called in to assess patient.  Lung sounds wet. IV lasix given. Bipap placed. Foley inserted. Will continue to monitor.

## 2015-04-03 NOTE — Progress Notes (Signed)
Physical Therapy Treatment Patient Details Name: Laura Jefferson MRN: [id number] DOB: 12/22/1924 Today's Date: 04/03/2015    History of Present Illness 81 yo female with onset of SOB after recent SNF discharge on home O2.  Recent L humeral fracture and NWB, demand ischemia with elevated troponin, acute respiratory failure.  PMHx:  CKD, PAF, NSTEMI, EF 40-45%, anxiety, PVD, dementia, COPD, DM, CHF    PT Comments    Daughter is concerned about her mother going home with telemetry or holter, nursing does not expect this to happen.  Will leave message with MD and otherwise pt is leaving tomorrow with HHPT to follow up with daughter to stay with her.  Follow Up Recommendations  Home health PT;Supervision/Assistance - 24 hour     Equipment Recommendations  None recommended by PT    Recommendations for Other Services       Precautions / Restrictions Precautions Precautions: Fall (telemetry) Restrictions Weight Bearing Restrictions: No    Mobility  Bed Mobility               General bed mobility comments: up when PT entered  Transfers Overall transfer level: Needs assistance Equipment used: 1 person hand held assist Transfers: Sit to/from Stand;Stand Pivot Transfers Sit to Stand: Min assist Stand pivot transfers: Min assist;Min guard       General transfer comment: used belt on her waist and gave her assist on RUE  Ambulation/Gait Ambulation/Gait assistance: Min assist;Min guard Ambulation Distance (Feet): 150 Feet Assistive device: 1 person hand held assist Gait Pattern/deviations: Step-through pattern;Trunk flexed;Drifts right/left;Wide base of support Gait velocity: reduced Gait velocity interpretation: Below normal speed for age/gender General Gait Details: Pt walked for a total of 150' with daughter assisting and PT teaching, using O2 tank and gait belt   Stairs            Wheelchair Mobility    Modified Rankin (Stroke Patients Only)        Balance Overall balance assessment: Needs assistance       Postural control: Posterior lean Standing balance support: Single extremity supported Standing balance-Leahy Scale: Fair Standing balance comment: fair- dynamic                    Cognition Arousal/Alertness: Awake/alert Behavior During Therapy: Impulsive;WFL for tasks assessed/performed Overall Cognitive Status: History of cognitive impairments - at baseline       Memory: Decreased short-term memory;Decreased recall of precautions              Exercises      General Comments General comments (skin integrity, edema, etc.): Pt still on telemetry unit and daughter thinks she will be going home with holter monitor      Pertinent Vitals/Pain Pain Assessment: No/denies pain    Home Living                      Prior Function            PT Goals (current goals can now be found in the care plan section) Acute Rehab PT Goals Patient Stated Goal: none stated Progress towards PT goals: Progressing toward goals    Frequency  Min 2X/week    PT Plan Current plan remains appropriate    Co-evaluation             End of Session Equipment Utilized During Treatment: Gait belt;Oxygen Activity Tolerance: Patient tolerated treatment well;Patient limited by fatigue Patient left: in chair;with call bell/phone within reach;with family/visitor present  Time: 9678-9381 PT Time Calculation (min) (ACUTE ONLY): 33 min  Charges:  $Gait Training: 23-37 mins                    G Codes:      Ivar Drape 04-06-15, 6:11 PM   Samul Dada, PT MS Acute Rehab Dept. Number: [phone]

## 2015-04-03 NOTE — Progress Notes (Signed)
[facility] - [place] at [facility]   PATIENT NAME: Laura Jefferson    MR#:  [id number]  DATE OF BIRTH:  03-18-1925  SUBJECTIVE:  CHIEF COMPLAINT:   Chief Complaint  Patient presents with  . Respiratory Distress   Patient very sleepy this am. Breathing is comfortable. Feels weak. No other complaints. REVIEW OF SYSTEMS:  CONSTITUTIONAL: No fever, fatigue or weakness.  RESPIRATORY: No cough, minimal shortness of breath, wheezing or hemoptysis.  CARDIOVASCULAR: No chest pain, orthopnea, edema.  GASTROINTESTINAL: No nausea, vomiting, diarrhea or abdominal pain.  GENITOURINARY: No dysuria, hematuria.  NEUROLOGIC: No tingling, numbness, weakness.  PSYCHIATRY: No anxiety or depression.   DRUG ALLERGIES:  No Known Allergies  VITALS:  Blood pressure 143/38, pulse 79, temperature 97.8 F (36.6 C), temperature source Oral, resp. rate 18, height 5' (1.524 m), weight 49.533 kg (109 lb 3.2 oz), SpO2 98 %.  PHYSICAL EXAMINATION:  GENERAL:  81 y.o.-year-old patient lying in the bed with no acute distress.  EYES: Pupils equal, round, reactive to light and accommodation. No scleral icterus. Extraocular muscles intact.  HEENT: Head atraumatic, normocephalic. Oropharynx and nasopharynx clear.  NECK:  Supple, no jugular venous distention. No thyroid enlargement, no tenderness.  LUNGS: Normal breath sounds bilaterally, no wheezing, fine  Rales at the bases. No use of accessory muscles of respiration.  CARDIOVASCULAR: S1, S2 normal. 3/6 systolic murmur present, no gallops  ABDOMEN: Soft, nontender, nondistended. Bowel sounds present. No organomegaly or mass.  EXTREMITIES: No pedal edema, cyanosis, or clubbing. Wounds with dressings noted on right shin and calf NEUROLOGIC: Cranial nerves II through XII are intact. Muscle strength intact in all extremities. Left arm in sling. Sensation intact. Gait not checked.  PSYCHIATRIC: The patient is alert and oriented x 2. Has dementia at  baseline. SKIN: No obvious rash, lesion, or ulcer.    LABORATORY PANEL:   CBC  Recent Labs Lab 04/03/15 0835  WBC 7.9  HGB 7.4*  HCT 23.2*  PLT 184   ------------------------------------------------------------------------------------------------------------------  Chemistries   Recent Labs Lab 04/01/15 0707  04/03/15 0358  NA 140  < > 141  K 3.9  < > 3.4*  CL 100*  < > 100*  CO2 29  < > 32  GLUCOSE 371*  < > 87  BUN 66*  < > 69*  CREATININE 1.62*  < > 1.80*  CALCIUM 8.2*  < > 8.4*  AST 18  --   --   ALT 15  --   --   ALKPHOS 98  --   --   BILITOT 0.6  --   --   < > = values in this interval not displayed. ------------------------------------------------------------------------------------------------------------------  Cardiac Enzymes  Recent Labs Lab 04/01/15 0707  TROPONINI 0.05*   ------------------------------------------------------------------------------------------------------------------  RADIOLOGY:  No results found.  EKG:   Orders placed or performed during the hospital encounter of 04/01/15  . EKG 12-Lead  . EKG 12-Lead  . EKG 12-Lead  . EKG 12-Lead    ASSESSMENT AND PLAN:   81 year old female with past medical history significant for congestive heart failure last known ejection fraction of 40%, diabetes mellitus, CK D with baseline creatinine of 1.8, recent and non-ST segment elevation MI, and left humerus fracture was discharged from rehabilitation to home yesterday presents to the hospital secondary to worsening respiratory distress.  Point #1 acute on chronic systolic CHF exacerbation-patient started on IV Lasix twice a day and diuresed well breathing is comfortable. Change lasix to 20mg  bid oral today Cardiology  has been consulted. The echo was just done last admission in April 2016 and EF is 40-45%. Started on metoprolol low dose. not on any ACEI or ARB due to chronic kidney disease.  #2. Leukocytosis- no evidence of infection,  levaquin was added with improvement in wbc. Monitor for now Finish 5 day course for possible bronchitis  #3. Anemia- baseline hb around 7, its dropped to 7.6 from 9. But stable today. Will transfuse if Hb<7  #4. DM- on lantus, glipizide and ssi  #5. COPD- on 2l oxygen at home, cont. On spiriva- continue  #6. CKD- stable, monitor while being diuresed  #7. DVT prophylaxis- lovenox  Physical therapy recommended home health. Possible discharge tomorrow    All the records are reviewed and case discussed with Care Management/Social Workerr. Management plans discussed with the patient, family and they are in agreement.  CODE STATUS: DNR  TOTAL TIME TAKING CARE OF THIS PATIENT: 30 minutes.   POSSIBLE D/C  tomorrow, DEPENDING ON CLINICAL CONDITION.   Hugh Kamara M.D on 04/03/2015 at 11:03 AM  Between 7am to 6pm - Pager - [phone]  After 6pm go to [email or web address] - password [email or web address] [facility]  [facility]  Office  [phone]  CC: Primary care physician; Marcine Matar, MD

## 2015-04-03 NOTE — Care Management (Signed)
Continue to anticipate discharge home with home health.   Daughter continues to be in agreement with discharge home.  She asks for resources for  "when home health discharges... What then?   Attending is looking for discharge in 24 - 48 hours.  Requested to have social work added to referral.

## 2015-04-03 NOTE — Plan of Care (Signed)
Problem: Phase II Progression Outcomes Goal: Begin discharge teaching Patient has ano acute event overnight. Patient was provided bathroom assistance with safety checks. Patient denied pain. N&V, and was assisted to repositioned Q@ and as needed. Daughter was updated about patient's status. Will continue to monitor

## 2015-04-04 LAB — GLUCOSE, CAPILLARY
GLUCOSE-CAPILLARY: 151 mg/dL — AB (ref 70–99)
GLUCOSE-CAPILLARY: 125 mg/dL — AB (ref 70–99)
GLUCOSE-CAPILLARY: 132 mg/dL — AB (ref 70–99)
GLUCOSE-CAPILLARY: 149 mg/dL — AB (ref 70–99)

## 2015-04-04 LAB — BASIC METABOLIC PANEL
Anion gap: 8 (ref 5–15)
BUN: 59 mg/dL — ABNORMAL HIGH (ref 6–20)
CHLORIDE: 102 mmol/L (ref 101–111)
CO2: 31 mmol/L (ref 22–32)
CREATININE: 1.8 mg/dL — AB (ref 0.44–1.00)
Calcium: 8.2 mg/dL — ABNORMAL LOW (ref 8.9–10.3)
GFR calc Af Amer: 28 mL/min — ABNORMAL LOW (ref >60)
GFR calc non Af Amer: 24 mL/min — ABNORMAL LOW (ref >60)
Glucose, Bld: 154 mg/dL — ABNORMAL HIGH (ref 65–99)
Potassium: 3.8 mmol/L (ref 3.5–5.1)
Sodium: 141 mmol/L (ref 135–145)

## 2015-04-04 NOTE — Progress Notes (Signed)
Patient: Laura Jefferson / Admit Date: 04/01/2015 / Date of Encounter: 04/04/2015, 12:57 PM   Subjective: She had an episode of acute respiratory distress overnight requiring IV Lasix 40 mg nad BiPAP into this morning. PO Lasix had just been decreased from 40 mg to 20 mg. She is currently off BiPAP and on 3L O2 via nasal cannula.   Review of Systems: Review of Systems  Constitutional: Positive for malaise/fatigue. Negative for fever, chills and diaphoresis.  Eyes: Negative for discharge and redness.  Respiratory: Positive for cough, shortness of breath and wheezing. Negative for hemoptysis and sputum production.   Cardiovascular: Positive for orthopnea. Negative for chest pain, palpitations, claudication, leg swelling and PND.  Neurological: Positive for weakness.    Objective: Telemetry: NSR, 60s, frequent ventricular bigeminy  Physical Exam: Blood pressure 102/42, pulse 120, temperature 98.6 F (37 C), temperature source Axillary, resp. rate 16, height 5' (1.524 m), weight 109 lb 6.4 oz (49.624 kg), SpO2 100 %. Body mass index is 21.37 kg/(m^2). General: Well developed, well nourished, in no acute distress. Head: Normocephalic, atraumatic, sclera non-icteric, no xanthomas, nares are without discharge. Neck: Negative for carotid bruits. JVP not elevated. Lungs: Crackles at the bilateral bases without wheezes or rhonchi. Breathing is unlabored. Heart: RRR S1 S2. 2/6 systolic murmur. No rubs or gallops.  Abdomen: Soft, non-tender, non-distended with normoactive bowel sounds. No rebound/guarding. Extremities: No clubbing or cyanosis. No edema. Distal pedal pulses are 2+ and equal bilaterally. Neuro: Alert and oriented X 3. Moves all extremities spontaneously. Psych:  Responds to questions appropriately with a normal affect.   Intake/Output Summary (Last 24 hours) at 04/04/15 1257 Last data filed at 04/04/15 1210  Gross per 24 hour  Intake    240 ml  Output   1350 ml  Net  -1110 ml      Inpatient Medications:  . antiseptic oral rinse  7 mL Mouth Rinse BID  . aspirin EC  81 mg Oral Daily  . enoxaparin (LOVENOX) injection  30 mg Subcutaneous Q24H  . feeding supplement (PRO-STAT SUGAR FREE 64)  30 mL Oral BID BM  . folic acid  0.5 mg Oral Daily  . furosemide  20 mg Oral BID  . glipiZIDE  2.5 mg Oral Q breakfast  . GLUCERNA  237 mL Oral BID BM  . insulin aspart  0-20 Units Subcutaneous TID WC  . insulin glargine  10 Units Subcutaneous Daily  . levofloxacin  250 mg Oral Q48H  . metoprolol tartrate  12.5 mg Oral BID  . nicotine  7 mg Transdermal Daily  . PARoxetine  20 mg Oral Daily  . simvastatin  20 mg Oral Daily  . sodium chloride  3 mL Intravenous Q12H  . tiotropium  18 mcg Inhalation Daily  . vitamin B-12  1,000 mcg Oral Daily  . Vitamin D  2,000 Units Oral Daily   Infusions:    Labs:  Recent Labs  04/03/15 0358 04/04/15 1030  NA 141 141  K 3.4* 3.8  CL 100* 102  CO2 32 31  GLUCOSE 87 154*  BUN 69* 59*  CREATININE 1.80* 1.80*  CALCIUM 8.4* 8.2*   No results for input(s): AST, ALT, ALKPHOS, BILITOT, PROT, ALBUMIN in the last 72 hours.  Recent Labs  04/02/15 0353 04/03/15 0835  WBC 9.9 7.9  HGB 7.6* 7.4*  HCT 23.7* 23.2*  MCV 93.1 92.0  PLT 194 184   No results for input(s): CKTOTAL, CKMB, TROPONINI in the last 72 hours. Invalid  input(s): POCBNP No results for input(s): HGBA1C in the last 72 hours.   Weights: Filed Weights   04/02/15 0338 04/03/15 0355 04/04/15 0436  Weight: 107 lb 14.4 oz (48.943 kg) 109 lb 3.2 oz (49.533 kg) 109 lb 6.4 oz (49.624 kg)     Radiology/Studies:  Dg Chest Portable 1 View  04/01/2015   CLINICAL DATA:  Respiratory distress.  EXAM: PORTABLE CHEST - 1 VIEW  COMPARISON:  03/04/2015.  FINDINGS: Cardiomegaly with aortic atherosclerosis persists. There is mild vascular congestion and BILATERAL pleural effusions consistent with early CHF. Compared with priors, the degree of failure is not as severe. Chronic  osseous changes are stable.  IMPRESSION: Early congestive failure.   Electronically Signed   By: Davonna Belling M.D.   On: 04/01/2015 07:51     Assessment and Plan  1. Acute respiratory failure: -Suspect acute on chronic systolic CHF/flash pulmonary edema upon admission with repeat flare overnight on 5/5 requiring extra dose of IV Lasix 40 mg and BiPAP -Currently weaned off BiPAP and on 3L O2 via nasal cannula  -Agree with going up on Lasix to 40 mg today -She ultimately may require higher doses of Lasix upon discharge (if renal function tolerates) given this repeat episode, otherwise she would need Lasix 20 mg bid (not on Lasix at home previously), with prn extra 20 mg for weight gain of 3 pounds or SOB -She will need close outpatient follow up -Continue Lopresor to 12.5 mg bid  -Recent echo 02/2015 with EF 40-45%, PASP 43 mm Hg, will not repeat at this time  2. Elevated troponin: -Mildly elevated at 0.05 -Likely demand ischemia in the setting of #1 and CKD -Previous admission she was deemed to be not an invasive candidate given her dementia, advanced renal insufficiency and anemia -Continue medical management with aspirin and simvastatin   3. Ventricular bigeminy/trigeminy: -Likely in the setting of myocardial stretch, may decrease in frequency with diuresis and decreased myocardial stretch  -Short runs of NSVT on telemetry previously upon admission  -Asymptomatic -Would continue Lopressor at 12.5 mg bid currently, could advance at later date as indicated pending HR and blood pressure -No indication for amiodarone at this time given her asymptomatic nature  -Plan for outpatient 30 day event monitor at discharge if she or her daughter would like   4. Leukocytosis: -Admitted with WBC count of 21.7 as above -Likely inflammatory in the setting of #1, resolved  -On Levaquin -Blood cultures no growth x 3 days to date  5. Acute on CKD stage IV: -Baseline SCr 1.4, currently  1.80 -Gentle diuresis as above -Avoid ACEi and ARBs given renal insufficiency  6. COPD: -Home home O2 -Off BiPAP currently -Continue O2 via nasal cannula   7. IDDM: -SSI per IM  8. Left arm fracture: -In the setting of increased weakness -Continue current treatment as advised by treating team  9. Dispo: -She appears too weak for independent living as her daughter would like -She is a DNR   Signed, Eula Listen, PA-C 04/04/2015 1:09 PM

## 2015-04-04 NOTE — Progress Notes (Signed)
Patient had resp distress during the night but after IV lasix and bipap being placed she slept peacefully during the night. No further s/s distress. Foley in place. COntinue to monitor.

## 2015-04-04 NOTE — Care Management Note (Signed)
Case Management Note  Patient Details  Name: Laura Jefferson MRN: [id number] Date of Birth: Jan 17, 1925  Subjective/Objective:          Respiratory decline overnight.  Currently requiring bipapp         Action/Plan:   Expected Discharge Date:       Now undetermined           Expected Discharge Plan:    had planned for home with home health but this could change5/  In-House Referral:     Discharge planning Services     Post Acute Care Choice:    Choice offered to:     DME Arranged:    DME Agency:     HH Arranged:    HH Agency:     Status of Service:     Medicare Important Message Given:  Yes Date Medicare IM Given:  04/02/15 Medicare IM give by:  Collie Siad Date Additional Medicare IM Given:   04/04/15 Additional Medicare Important Message give by:   Ermalene Searing  If discussed at Long Length of Stay Meetings, dates discussed:    Additional Comments:  Eber Hong, RN 04/04/2015, 8:01 AM

## 2015-04-04 NOTE — Progress Notes (Signed)
[facility] - [place] at [facility]   PATIENT NAME: Laura Jefferson    MR#:  [id number]  DATE OF BIRTH:  [birth year]  SUBJECTIVE:  CHIEF COMPLAINT:   Chief Complaint  Patient presents with  . Respiratory Distress   Had an episode of respiratory distress last night and received an extra dose of lasix and also started on Bipap. This am remains on Bipap- feels better- fio2 at 45%. Lasix dose was just decreased yesterday.  REVIEW OF SYSTEMS:  CONSTITUTIONAL: No fever, fatigue or weakness.  RESPIRATORY: Positive for shortness of breath, No wheezing or hemoptysis.  CARDIOVASCULAR: No chest pain, orthopnea, edema.  GASTROINTESTINAL: No nausea, vomiting, diarrhea or abdominal pain.  GENITOURINARY: No dysuria, hematuria.  NEUROLOGIC: No tingling, numbness, weakness.  PSYCHIATRY: No anxiety or depression.   DRUG ALLERGIES:  No Known Allergies  VITALS:  Blood pressure 148/45, pulse 71, temperature 98.6 F (37 C), temperature source Axillary, resp. rate 22, height 5' (1.524 m), weight 49.624 kg (109 lb 6.4 oz), SpO2 100 %.  PHYSICAL EXAMINATION:  GENERAL:  81 y.o.-year-old patient lying in the bed with no acute distress.  EYES: Pupils equal, round, reactive to light and accommodation. No scleral icterus. Extraocular muscles intact.  HEENT: Head atraumatic, normocephalic. Oropharynx and nasopharynx clear.  NECK:  Supple, no jugular venous distention. No thyroid enlargement, no tenderness.  LUNGS: Normal breath sounds bilaterally, no wheezing, fine  Rales at the bases. No use of accessory muscles of respiration.  CARDIOVASCULAR: S1, S2 normal. 3/6 systolic murmur present, no gallops  ABDOMEN: Soft, nontender, nondistended. Bowel sounds present. No organomegaly or mass.  EXTREMITIES: No pedal edema, cyanosis, or clubbing. Wounds with dressings noted on right shin and calf NEUROLOGIC: Cranial nerves II through XII are intact. Muscle strength intact in all extremities.  Left arm in sling. Sensation intact. Gait not checked.  PSYCHIATRIC: The patient is alert and oriented x 2. Has dementia at baseline. SKIN: No obvious rash, lesion, or ulcer. Skin tear lesions on right shin and calf   LABORATORY PANEL:   CBC  Recent Labs Lab 04/03/15 0835  WBC 7.9  HGB 7.4*  HCT 23.2*  PLT 184   ------------------------------------------------------------------------------------------------------------------  Chemistries   Recent Labs Lab 04/01/15 0707  04/03/15 0358  NA 140  < > 141  K 3.9  < > 3.4*  CL 100*  < > 100*  CO2 29  < > 32  GLUCOSE 371*  < > 87  BUN 66*  < > 69*  CREATININE 1.62*  < > 1.80*  CALCIUM 8.2*  < > 8.4*  AST 18  --   --   ALT 15  --   --   ALKPHOS 98  --   --   BILITOT 0.6  --   --   < > = values in this interval not displayed. ------------------------------------------------------------------------------------------------------------------  Cardiac Enzymes  Recent Labs Lab 04/01/15 0707  TROPONINI 0.05*   ------------------------------------------------------------------------------------------------------------------  RADIOLOGY:  No results found.  EKG:   Orders placed or performed during the hospital encounter of 04/01/15  . EKG 12-Lead  . EKG 12-Lead  . EKG 12-Lead  . EKG 12-Lead    ASSESSMENT AND PLAN:   81 year old female with past medical history significant for congestive heart failure last known ejection fraction of 40%, diabetes mellitus, CK D with baseline creatinine of 1.8, recent and non-ST segment elevation MI, and left humerus fracture was discharged from rehabilitation to home yesterday presents to the hospital secondary to worsening respiratory  distress.  Point #1 Acute respiratory distress- secondary to acute on chronic systolic CHF exacerbation-received another dose of lasix last night. On Bipap now- likely will try to wean to nasal cannula today Will go up on lasix today. Cardiology  following. The echo was just done last admission in April 2016 and EF is 40-45%. on metoprolol low dose. not on any ACEI or ARB due to chronic kidney disease. Patient was seen by Palliative care in the past- but family not interested in Hospice and wants to get patient to rehab.  #2. Leukocytosis- no evidence of infection, levaquin was added with improvement in wbc. Monitor for now Finish 5 day course for possible bronchitis  #3. Anemia- baseline hb around 7, its dropped to 7.6 from 9. But stable now. Will transfuse if Hb<7  #4. DM- on lantus, glipizide and ssi  #5. COPD- on 2l oxygen at home, cont. On spiriva- continue  #6. CKD- stable, monitor while being diuresed  #7. DVT prophylaxis- lovenox  Physical therapy recommended home health. But might change now.    All the records are reviewed and case discussed with Care Management/Social Workerr. Management plans discussed with the patient, family and they are in agreement.  CODE STATUS: DNR  TOTAL TIME TAKING CARE OF THIS PATIENT: 37 minutes.   DISCHARGE IN 2-3 DAYS, DEPENDING ON CLINICAL CONDITION.   Enid Baas M.D on 04/04/2015 at 9:39 AM  Between 7am to 6pm - Pager - [phone]  After 6pm go to [email or web address] - password [email or web address] [facility]  [facility]  Office  [phone]  CC: Primary care physician; Marcine Matar, MD

## 2015-04-04 NOTE — Progress Notes (Signed)
Pt taken off bi pap at 1000 this am. Placed on n.c. Cannula. Ate breakfast welll. Pt alert and oriented. Painfree. Foley draining well. Lungs have fine rales. sats in 90's.

## 2015-04-04 NOTE — Progress Notes (Signed)
PT Cancellation Note  Patient Details Name: Laura Jefferson MRN: [id number] DOB: 27-Mar-1925   Cancelled Treatment:    Reason Eval/Treat Not Completed: Fatigue/lethargy limiting ability to participate;Other (comment) (Nursing reports she is sleepy as she needed Bipap last night).  Will try later if pt able.   Ivar Drape 04/04/2015, 10:35 AM  Samul Dada, PT MS Acute Rehab Dept. Number: [phone]

## 2015-04-04 NOTE — Progress Notes (Signed)
Moved from 234 to 251 due to high fall risk combined with impulsive behaviour.

## 2015-04-04 NOTE — Care Management (Signed)
Discussed palliative care consult with primary nurse.  One had been ordered but then cancelled.  It is reported that team had met with patient's daughter previously and "it di not go well."  Attending will speak with patient's daughter when she arrives on unit this afternoon.  There is concern that daughter feels maybe patient should  Go to skilled nursing or something "other than home" due to her recent set backs.  Updated CSW.

## 2015-04-05 DIAGNOSIS — I5043 Acute on chronic combined systolic (congestive) and diastolic (congestive) heart failure: Secondary | ICD-10-CM

## 2015-04-05 DIAGNOSIS — J441 Chronic obstructive pulmonary disease with (acute) exacerbation: Secondary | ICD-10-CM

## 2015-04-05 LAB — BASIC METABOLIC PANEL
Anion gap: 6 (ref 5–15)
BUN: 57 mg/dL — AB (ref 6–20)
CALCIUM: 8.5 mg/dL — AB (ref 8.9–10.3)
CO2: 34 mmol/L — ABNORMAL HIGH (ref 22–32)
CREATININE: 1.7 mg/dL — AB (ref 0.44–1.00)
Chloride: 104 mmol/L (ref 101–111)
GFR calc non Af Amer: 26 mL/min — ABNORMAL LOW (ref >60)
GFR, EST AFRICAN AMERICAN: 30 mL/min — AB (ref >60)
GLUCOSE: 112 mg/dL — AB (ref 65–99)
Potassium: 3.8 mmol/L (ref 3.5–5.1)
Sodium: 144 mmol/L (ref 135–145)

## 2015-04-05 LAB — CBC
HEMATOCRIT: 24.6 % — AB (ref 35.0–47.0)
Hemoglobin: 7.7 g/dL — ABNORMAL LOW (ref 12.0–16.0)
MCH: 29 pg (ref 26.0–34.0)
MCHC: 31.3 g/dL — AB (ref 32.0–36.0)
MCV: 92.7 fL (ref 80.0–100.0)
PLATELETS: 180 10*3/uL (ref 150–440)
RBC: 2.65 MIL/uL — AB (ref 3.80–5.20)
RDW: 15.7 % — AB (ref 11.5–14.5)
WBC: 8 10*3/uL (ref 3.6–11.0)

## 2015-04-05 LAB — GLUCOSE, CAPILLARY
GLUCOSE-CAPILLARY: 140 mg/dL — AB (ref 70–99)
Glucose-Capillary: 127 mg/dL — ABNORMAL HIGH (ref 70–99)
Glucose-Capillary: 107 mg/dL — ABNORMAL HIGH (ref 70–99)
Glucose-Capillary: 144 mg/dL — ABNORMAL HIGH (ref 70–99)

## 2015-04-05 MED ORDER — METOPROLOL SUCCINATE ER 25 MG PO TB24
25.0000 mg | ORAL_TABLET | Freq: Every day | ORAL | Status: DC
  Administered 2015-04-06 – 2015-04-10 (×4): 25 mg via ORAL
  Filled 2015-04-05 (×5): qty 1

## 2015-04-05 MED ORDER — FUROSEMIDE 40 MG PO TABS: 60.0000 mg | ORAL_TABLET | Freq: Every day | ORAL | Status: DC

## 2015-04-05 MED ORDER — FERROUS SULFATE 325 (65 FE) MG PO TABS
325.0000 mg | ORAL_TABLET | Freq: Three times a day (TID) | ORAL | Status: DC
  Administered 2015-04-05 – 2015-04-11 (×16): 325 mg via ORAL
  Filled 2015-04-05 (×18): qty 1

## 2015-04-05 MED ORDER — FUROSEMIDE 40 MG PO TABS
40.0000 mg | ORAL_TABLET | Freq: Every day | ORAL | Status: DC
  Administered 2015-04-05 – 2015-04-06 (×2): 40 mg via ORAL
  Filled 2015-04-05 (×2): qty 1

## 2015-04-05 NOTE — Progress Notes (Signed)
[facility] - [place] at [facility]   PATIENT NAME: Laura Jefferson    MR#:  [id number]  DATE OF BIRTH:  Mar 16, 1925  SUBJECTIVE:  CHIEF COMPLAINT:   Chief Complaint  Patient presents with  . Respiratory Distress    Patient's breathing is much improved compared to yesterday. Currently off BiPAP. Patient not eating much as per nurse.  REVIEW OF SYSTEMS:  CONSTITUTIONAL: No fever, fatigue or weakness.  RESPIRATORY: Positive for shortness of breath, No wheezing or hemoptysis.  CARDIOVASCULAR: No chest pain, orthopnea, edema.  GASTROINTESTINAL: No nausea, vomiting, diarrhea or abdominal pain.  GENITOURINARY: No dysuria, hematuria.  NEUROLOGIC: No tingling, numbness, weakness.  PSYCHIATRY: No anxiety or depression.   DRUG ALLERGIES:  No Known Allergies  VITALS:  Blood pressure 144/40, pulse 47, temperature 97.8 F (36.6 C), temperature source Oral, resp. rate 16, height 5' (1.524 m), weight 51.347 kg (113 lb 3.2 oz), SpO2 100 %.  PHYSICAL EXAMINATION:  GENERAL:  81 y.o.-year-old patient lying in the bed with no acute distress.  EYES: Pupils equal, round, reactive to light and accommodation. No scleral icterus. Extraocular muscles intact.  HEENT: Head atraumatic, normocephalic. Oropharynx and nasopharynx clear.  NECK:  Supple, no jugular venous distention. No thyroid enlargement, no tenderness.  LUNGS: Normal breath sounds bilaterally, no wheezing, fine  Rales at the bases. No use of accessory muscles of respiration.  CARDIOVASCULAR: S1, S2 normal. 3/6 systolic murmur present, no gallops  ABDOMEN: Soft, nontender, nondistended. Bowel sounds present. No organomegaly or mass.  EXTREMITIES: No pedal edema, cyanosis, or clubbing. Wounds with dressings noted on right shin and calf NEUROLOGIC: Cranial nerves II through XII are intact. Muscle strength intact in all extremities. Left arm in sling. Sensation intact. Gait not checked.  PSYCHIATRIC: The patient is alert  and oriented x 2. Has dementia at baseline. SKIN: No obvious rash, lesion, or ulcer. Skin tear lesions on right shin and calf   LABORATORY PANEL:   CBC  Recent Labs Lab 04/05/15 0518  WBC 8.0  HGB 7.7*  HCT 24.6*  PLT 180   ------------------------------------------------------------------------------------------------------------------  Chemistries   Recent Labs Lab 04/01/15 0707  04/05/15 0513  NA 140  < > 144  K 3.9  < > 3.8  CL 100*  < > 104  CO2 29  < > 34*  GLUCOSE 371*  < > 112*  BUN 66*  < > 57*  CREATININE 1.62*  < > 1.70*  CALCIUM 8.2*  < > 8.5*  AST 18  --   --   ALT 15  --   --   ALKPHOS 98  --   --   BILITOT 0.6  --   --   < > = values in this interval not displayed. ------------------------------------------------------------------------------------------------------------------  Cardiac Enzymes  Recent Labs Lab 04/01/15 0707  TROPONINI 0.05*   ------------------------------------------------------------------------------------------------------------------  RADIOLOGY:  No results found.  EKG:   Orders placed or performed during the hospital encounter of 04/01/15  . EKG 12-Lead  . EKG 12-Lead  . EKG 12-Lead  . EKG 12-Lead    ASSESSMENT AND PLAN:   81 year old female with past medical history significant for congestive heart failure last known ejection fraction of 40%, diabetes mellitus, CK D with baseline creatinine of 1.8, recent and non-ST segment elevation MI, and left humerus fracture was discharged from rehabilitation to home yesterday presents to the hospital secondary to worsening respiratory distress.   #1 Acute respiratory distress- secondary to acute on chronic systolic CHF exacerbation- Improved today  Will go up on lasix today. Cardiology following. The echo was just done last admission in April 2016 and EF is 40-45%. on metoprolol low dose. not on any ACEI or ARB due to chronic kidney disease. Patient was seen by  Palliative care in the past- but family not interested in Hospice and wants to get patient to rehab.  #2. Leukocytosis- no evidence of infection, levaquin was added with improvement in wbc. Monitor for now Finish 5 day course for possible bronchitis  #3. Anemia- baseline hb around 7, start patient on iron therapy. Transfuse as needed. Will transfuse if Hb<7  #4. DM- on lantus, glipizide and ssi  #5. COPD- on 2l oxygen at home, cont. On spiriva- continue  #6. CKD- stable, monitor while being diuresed  #7. DVT prophylaxis- lovenox  Physical therapy recommended home health. But might change now.    All the records are reviewed and case discussed with Care Management/Social Workerr. Management plans discussed with the patient, family and they are in agreement.  CODE STATUS: DNR  TOTAL TIME TAKING CARE OF THIS PATIENT: 35 minutes.   DISCHARGE IN 2-3 DAYS, DEPENDING ON CLINICAL CONDITION.   Auburn Bilberry M.D on 04/05/2015 at 1:03 PM  Between 7am to 6pm - Pager - [phone]  After 6pm go to [email or web address] - password [email or web address] [facility]  [facility]  Office  [phone]  CC: Primary care physician; Marcine Matar, MD

## 2015-04-05 NOTE — Progress Notes (Signed)
Morning hemoglobin was 7.7, up from 7.4 yesterday. Dr. Allena Katz aware and continued to monitor.

## 2015-04-05 NOTE — Progress Notes (Signed)
RN and CNA got pt up in the chair today for evening meal. Tolerated well, walked around the bed with walker and currently still in chair with legs elevated.

## 2015-04-05 NOTE — Progress Notes (Signed)
Patient ID: Laura Jefferson, female   DOB: 03/01/1925, 81 y.o.   MRN: [id number]    Patient: Laura Jefferson / Admit Date: 04/01/2015 / Date of Encounter: 04/05/2015, 11:02 AM   Subjective: Patient says she is feeling better today.  Denies dyspnea.  No chest pain.  Now back on po Lasix.   Review of Systems: ROS  Objective: Telemetry: NSR, 60s, frequent ventricular bigeminy  Physical Exam: Blood pressure 148/37, pulse 69, temperature 98.5 F (36.9 C), temperature source Oral, resp. rate 25, height 5' (1.524 m), weight 113 lb 3.2 oz (51.347 kg), SpO2 100 %. Body mass index is 22.11 kg/(m^2). General: NAD Neck: No JVD Lungs: Crackles at the bilateral bases without wheezes or rhonchi. Breathing is unlabored. Heart: RRR S1 S2. 2/6 systolic murmur RUSB. No rubs or gallops.  Abdomen: Soft, non-tender, non-distended with normoactive bowel sounds. No rebound/guarding. Extremities: No clubbing or cyanosis. No edema.  Neuro: Alert and oriented X 3. Moves all extremities spontaneously. Psych:  Responds to questions appropriately with a normal affect.   Intake/Output Summary (Last 24 hours) at 04/05/15 1102 Last data filed at 04/05/15 0830  Gross per 24 hour  Intake    480 ml  Output   1950 ml  Net  -1470 ml    Inpatient Medications:  . antiseptic oral rinse  7 mL Mouth Rinse BID  . aspirin EC  81 mg Oral Daily  . enoxaparin (LOVENOX) injection  30 mg Subcutaneous Q24H  . feeding supplement (PRO-STAT SUGAR FREE 64)  30 mL Oral BID BM  . folic acid  0.5 mg Oral Daily  . furosemide  60 mg Oral Daily  . glipiZIDE  2.5 mg Oral Q breakfast  . GLUCERNA  237 mL Oral BID BM  . insulin aspart  0-20 Units Subcutaneous TID WC  . insulin glargine  10 Units Subcutaneous Daily  . levofloxacin  250 mg Oral Q48H  . metoprolol succinate  25 mg Oral Daily  . nicotine  7 mg Transdermal Daily  . PARoxetine  20 mg Oral Daily  . simvastatin  20 mg Oral Daily  . sodium chloride  3 mL Intravenous Q12H  .  tiotropium  18 mcg Inhalation Daily  . vitamin B-12  1,000 mcg Oral Daily  . Vitamin D  2,000 Units Oral Daily   Infusions:    Labs:  Recent Labs  04/04/15 1030 04/05/15 0513  NA 141 144  K 3.8 3.8  CL 102 104  CO2 31 34*  GLUCOSE 154* 112*  BUN 59* 57*  CREATININE 1.80* 1.70*  CALCIUM 8.2* 8.5*   No results for input(s): AST, ALT, ALKPHOS, BILITOT, PROT, ALBUMIN in the last 72 hours.  Recent Labs  04/03/15 0835 04/05/15 0518  WBC 7.9 8.0  HGB 7.4* 7.7*  HCT 23.2* 24.6*  MCV 92.0 92.7  PLT 184 180   No results for input(s): CKTOTAL, CKMB, TROPONINI in the last 72 hours. Invalid input(s): POCBNP No results for input(s): HGBA1C in the last 72 hours.   Weights: Filed Weights   04/03/15 0355 04/04/15 0436 04/04/15 1938  Weight: 109 lb 3.2 oz (49.533 kg) 109 lb 6.4 oz (49.624 kg) 113 lb 3.2 oz (51.347 kg)     Radiology/Studies:  Dg Chest Portable 1 View  04/01/2015   CLINICAL DATA:  Respiratory distress.  EXAM: PORTABLE CHEST - 1 VIEW  COMPARISON:  03/04/2015.  FINDINGS: Cardiomegaly with aortic atherosclerosis persists. There is mild vascular congestion and BILATERAL pleural effusions consistent with early  CHF. Compared with priors, the degree of failure is not as severe. Chronic osseous changes are stable.  IMPRESSION: Early congestive failure.   Electronically Signed   By: Davonna Belling M.D.   On: 04/01/2015 07:51     Assessment and Plan  81 year old female with past medical history significant for congestive heart failure last known ejection fraction of 40%, diabetes mellitus, CKD with baseline creatinine of 1.8, recent non-ST segment elevation MI, and left humerus fracture was presented to the hospital secondary to worsening respiratory distress. 1. Acute respiratory failure:  Suspect acute on chronic systolic CHF/flash pulmonary edema upon admission with repeat flare overnight on 5/5 requiring extra dose of IV Lasix 40 mg and BiPAP.  Currently weaned off BiPAP and on  3L O2 via nasal cannula.  She looks euvolemic on exam today.  - Would put her on Lasix 40 mg daily for discharge.  Apparently she has not been on Lasix at home. - Given depressed EF, convert metoprolol to Toprol XL 25 mg daily.  - Recent echo 02/2015 with EF 40-45%, PASP 43 mm Hg, will not repeat at this time 2. Elevated troponin:  Mildly elevated at 0.05.  Likely demand ischemia in the setting of #1 and CKD.  -Previous admission she was deemed to be not an invasive candidate given her dementia, advanced renal insufficiency and anemia.  -Continue medical management with aspirin and simvastatin.   3. Ventricular bigeminy/trigeminy:  Continue Toprol XL. -Plan for outpatient 30 day event monitor at discharge if she or her daughter would like  4. Leukocytosis: -Admitted with WBC count of 21.7 as above -Likely inflammatory in the setting of #1, resolved  -On Levaquin -Blood cultures no growth  5. Acute on CKD stage IV: Creatinine better today at 1.7.  -Avoid ACEi and ARBs given renal insufficiency 6. COPD: -home O2 -Off BiPAP currently -Continue O2 via nasal cannula  7. IDDM: -SSI per IM 8. Left arm fracture: -In the setting of increased weakness -Continue current treatment as advised by treating team 9. Dispo: -She appears too weak for independent living as her daughter would like -She is a DNR  Marca Ancona 04/05/2015 11:07 AM

## 2015-04-06 LAB — BASIC METABOLIC PANEL WITH GFR
Anion gap: 11 (ref 5–15)
BUN: 60 mg/dL — ABNORMAL HIGH (ref 6–20)
CO2: 31 mmol/L (ref 22–32)
Calcium: 8.6 mg/dL — ABNORMAL LOW (ref 8.9–10.3)
Chloride: 102 mmol/L (ref 101–111)
Creatinine, Ser: 1.84 mg/dL — ABNORMAL HIGH (ref 0.44–1.00)
GFR calc Af Amer: 27 mL/min — ABNORMAL LOW
GFR calc non Af Amer: 24 mL/min — ABNORMAL LOW
Glucose, Bld: 103 mg/dL — ABNORMAL HIGH (ref 65–99)
Potassium: 3.8 mmol/L (ref 3.5–5.1)
Sodium: 144 mmol/L (ref 135–145)

## 2015-04-06 LAB — CBC
HCT: 26.5 % — ABNORMAL LOW (ref 35.0–47.0)
Hemoglobin: 8.3 g/dL — ABNORMAL LOW (ref 12.0–16.0)
MCH: 28.8 pg (ref 26.0–34.0)
MCHC: 31.5 g/dL — ABNORMAL LOW (ref 32.0–36.0)
MCV: 91.5 fL (ref 80.0–100.0)
Platelets: 205 K/uL (ref 150–440)
RBC: 2.9 MIL/uL — ABNORMAL LOW (ref 3.80–5.20)
RDW: 15.7 % — ABNORMAL HIGH (ref 11.5–14.5)
WBC: 9.3 K/uL (ref 3.6–11.0)

## 2015-04-06 LAB — CULTURE, BLOOD (ROUTINE X 2)
CULTURE: NO GROWTH
Culture: NO GROWTH

## 2015-04-06 LAB — CULTURE, BLOOD (SINGLE)
Culture: NO GROWTH
Culture: NO GROWTH

## 2015-04-06 LAB — GLUCOSE, CAPILLARY
GLUCOSE-CAPILLARY: 178 mg/dL — AB (ref 70–99)
Glucose-Capillary: 204 mg/dL — ABNORMAL HIGH (ref 70–99)
Glucose-Capillary: 209 mg/dL — ABNORMAL HIGH (ref 70–99)
Glucose-Capillary: 261 mg/dL — ABNORMAL HIGH (ref 70–99)
Glucose-Capillary: 268 mg/dL — ABNORMAL HIGH (ref 70–99)

## 2015-04-06 MED ORDER — MEGESTROL ACETATE 400 MG/10ML PO SUSP
400.0000 mg | Freq: Every day | ORAL | Status: DC
  Administered 2015-04-06 – 2015-04-08 (×3): 400 mg via ORAL
  Administered 2015-04-09: 40 mg via ORAL
  Administered 2015-04-10 – 2015-04-11 (×2): 400 mg via ORAL
  Filled 2015-04-06 (×6): qty 10

## 2015-04-06 MED ORDER — INSULIN ASPART 100 UNIT/ML ~~LOC~~ SOLN
0.0000 [IU] | Freq: Every day | SUBCUTANEOUS | Status: DC
  Administered 2015-04-06: 2 [IU] via SUBCUTANEOUS
  Administered 2015-04-08 – 2015-04-10 (×2): 3 [IU] via SUBCUTANEOUS
  Filled 2015-04-06 (×2): qty 3

## 2015-04-06 MED ORDER — INSULIN ASPART 100 UNIT/ML ~~LOC~~ SOLN
0.0000 [IU] | Freq: Three times a day (TID) | SUBCUTANEOUS | Status: DC
Start: 2015-04-07 — End: 2015-04-06

## 2015-04-06 MED ORDER — FUROSEMIDE 20 MG PO TABS
20.0000 mg | ORAL_TABLET | Freq: Two times a day (BID) | ORAL | Status: DC
  Administered 2015-04-07: 20 mg via ORAL
  Filled 2015-04-06: qty 1

## 2015-04-06 NOTE — Progress Notes (Signed)
PT Cancellation Note  Patient Details Name: LENDY BUSHNELL MRN: [id number] DOB: August 25, 1925   Cancelled Treatment:    Reason Eval/Treat Not Completed: Patient declined, no reason specified (cognition). Contacted by MD regarding re-evaluation to determine if patient is appropriate for SNF. Attempted repeatedly to encourage patient to participate with therapy today however pt declines adamantly. Patient continues to decline any activity with therapist including seated exercises or simply standing up next to chair. Per RN pt is declining all activities with nursing as well. Will attempt treatment on later date as patient is willing to participate. Currently pt is not appropriate for SNF given dementia which limits her ability to participate with therapy.    Huprich,Jason 04/06/2015, 1:16 PM

## 2015-04-06 NOTE — Progress Notes (Signed)
Patient ID: Laura Jefferson, female   DOB: [birth year], 81 y.o.   MRN: [id number] Patient ID: Laura Jefferson, female   DOB: [birth year], 81 y.o.   MRN: [id number]    Patient: Laura Jefferson / Admit Date: 04/01/2015 / Date of Encounter: 04/06/2015, 11:08 AM   Subjective: Diuresed quite a lot yesterday.  Weight down.  She says, however, that she feels worse today.  Not able to go into much detail as to why.   Review of Systems: ROS  Objective: Telemetry: NSR, 60s, frequent ventricular bigeminy  Physical Exam: Blood pressure 124/40, pulse 79, temperature 97.6 F (36.4 C), temperature source Oral, resp. rate 16, height 5' (1.524 m), weight 108 lb 9.6 oz (49.261 kg), SpO2 94 %. Body mass index is 21.21 kg/(m^2). General: NAD Neck: No JVD Lungs: Crackles at the bilateral bases without wheezes or rhonchi. Breathing is unlabored. Heart: RRR S1 S2. 2/6 systolic murmur RUSB. No rubs or gallops.  Abdomen: Soft, non-tender, non-distended with normoactive bowel sounds. No rebound/guarding. Extremities: No clubbing or cyanosis. No edema.  Neuro: Alert and oriented X 3. Moves all extremities spontaneously. Psych:  Responds to questions appropriately with a normal affect.   Intake/Output Summary (Last 24 hours) at 04/06/15 1108 Last data filed at 04/06/15 0940  Gross per 24 hour  Intake   1197 ml  Output   3000 ml  Net  -1803 ml    Inpatient Medications:  . antiseptic oral rinse  7 mL Mouth Rinse BID  . aspirin EC  81 mg Oral Daily  . enoxaparin (LOVENOX) injection  30 mg Subcutaneous Q24H  . feeding supplement (PRO-STAT SUGAR FREE 64)  30 mL Oral BID BM  . ferrous sulfate  325 mg Oral TID WC  . folic acid  0.5 mg Oral Daily  . [START ON 04/07/2015] furosemide  20 mg Oral BID  . glipiZIDE  2.5 mg Oral Q breakfast  . GLUCERNA  237 mL Oral BID BM  . insulin aspart  0-20 Units Subcutaneous TID WC  . insulin glargine  10 Units Subcutaneous Daily  . levofloxacin  250 mg Oral Q48H  . megestrol  400  mg Oral Daily  . metoprolol succinate  25 mg Oral Daily  . nicotine  7 mg Transdermal Daily  . PARoxetine  20 mg Oral Daily  . simvastatin  20 mg Oral Daily  . sodium chloride  3 mL Intravenous Q12H  . tiotropium  18 mcg Inhalation Daily  . vitamin B-12  1,000 mcg Oral Daily  . Vitamin D  2,000 Units Oral Daily   Infusions:    Labs:  Recent Labs  04/05/15 0513 04/06/15 0456  NA 144 144  K 3.8 3.8  CL 104 102  CO2 34* 31  GLUCOSE 112* 103*  BUN 57* 60*  CREATININE 1.70* 1.84*  CALCIUM 8.5* 8.6*   No results for input(s): AST, ALT, ALKPHOS, BILITOT, PROT, ALBUMIN in the last 72 hours.  Recent Labs  04/05/15 0518 04/06/15 0456  WBC 8.0 9.3  HGB 7.7* 8.3*  HCT 24.6* 26.5*  MCV 92.7 91.5  PLT 180 205   No results for input(s): CKTOTAL, CKMB, TROPONINI in the last 72 hours. Invalid input(s): POCBNP No results for input(s): HGBA1C in the last 72 hours.   Weights: Filed Weights   04/04/15 0436 04/04/15 1938 04/06/15 0612  Weight: 109 lb 6.4 oz (49.624 kg) 113 lb 3.2 oz (51.347 kg) 108 lb 9.6 oz (49.261 kg)     Radiology/Studies:  Dg Chest Portable 1 View  04/01/2015   CLINICAL DATA:  Respiratory distress.  EXAM: PORTABLE CHEST - 1 VIEW  COMPARISON:  03/04/2015.  FINDINGS: Cardiomegaly with aortic atherosclerosis persists. There is mild vascular congestion and BILATERAL pleural effusions consistent with early CHF. Compared with priors, the degree of failure is not as severe. Chronic osseous changes are stable.  IMPRESSION: Early congestive failure.   Electronically Signed   By: Davonna Belling M.D.   On: 04/01/2015 07:51     Assessment and Plan  81 year old female with past medical history significant for congestive heart failure last known ejection fraction of 40%, diabetes mellitus, CKD with baseline creatinine of 1.8, recent non-ST segment elevation MI, and left humerus fracture was presented to the hospital secondary to worsening respiratory distress. 1. Acute  respiratory failure:  Suspect acute on chronic systolic CHF/flash pulmonary edema upon admission with repeat flare overnight on 5/5 requiring extra dose of IV Lasix 40 mg and BiPAP.  Currently weaned off BiPAP and on 3L O2 via nasal cannula.  She looks euvolemic on exam today.  She actually diuresed a lot yesterday on Lasix 40 mg daily, and creatinine is up some.  - Probably best to go back to Lasix 20 mg po bid given significant diuresis yesterday on Lasix 40 po daily with rise in creatinine.  - Continue Toprol XL 25 daily.  - Recent echo 02/2015 with EF 40-45%, PASP 43 mm Hg, will not repeat at this time 2. Elevated troponin:  Mildly elevated at 0.05.  Likely demand ischemia in the setting of #1 and CKD.  -Previous admission she was deemed to be not an invasive candidate given her dementia, advanced renal insufficiency and anemia.  -Continue medical management with aspirin and simvastatin.   3. Ventricular bigeminy/trigeminy:  Continue Toprol XL. -Plan for outpatient 30 day event monitor at discharge if she or her daughter would like  4. Leukocytosis: -Admitted with WBC count of 21.7 as above -Likely inflammatory in the setting of #1, resolved  -On Levaquin -Blood cultures no growth  5. Acute on CKD stage IV: Creatinine slightly higher today at 1.8.   -Avoid ACEi and ARBs given renal insufficiency 6. COPD: -home O2 -Off BiPAP currently -Continue O2 via nasal cannula  7. IDDM: -SSI per IM 8. Left arm fracture: -In the setting of increased weakness -Continue current treatment as advised by treating team 9. Dispo: -She appears too weak for independent living as her daughter would like => needs evaluation for rehab stay.  -She is a DNR  Marca Ancona 04/06/2015 11:08 AM

## 2015-04-06 NOTE — Progress Notes (Signed)
Physical Therapy Treatment Patient Details Name: Laura Jefferson MRN: [id number] DOB: Apr 17, 1925 Today's Date: 04/06/2015    History of Present Illness 81 yo female with onset of SOB after recent SNF discharge on home O2.  Recent L humeral fracture and NWB, demand ischemia with elevated troponin, acute respiratory failure.  PMHx:  CKD, PAF, NSTEMI, EF 40-45%, anxiety, PVD, dementia, COPD, DM, CHF    PT Comments    Pt demonstrates poor cognition and safety awareness due to baseline dementia. She participates with therapy on second attempt with heavy encouragement from family. Family desires to take patient home with [facility] PT but would be agreeable to SNF if appropriate. Pt does demonstrate some unsteadiness during ambulation but generally is not a SNF candidate due to lack of assist required for mobility as well as cognitive status. Therapist believes the most appropriate discharge plan is home with 24/7 assist and [facility] PT. Family educated regarding use of gait belt with ambulation. Daughter educated about safe SpO2 ranges with personal pulse oximeter that she bought at the pharmacy. According to the family Dr. Deeann Saint told pt she can take her LUE out of the sling as of last Friday. Will attempt to clarify LUE WB status tomorrow as pt would be much safer if she could utilize rolling walker for ambulation. Family in agreement with discharge plans.   Follow Up Recommendations  Home health PT;Supervision/Assistance - 24 hour (Not SNF appropriate due to level of assist and cognition)     Equipment Recommendations  None recommended by PT    Recommendations for Other Services       Precautions / Restrictions Precautions Precautions: Fall (telemetry) Required Braces or Orthoses: Other Brace/Splint (Pt in LUE sling for L humerus fracture) Restrictions Weight Bearing Restrictions: Yes Other Position/Activity Restrictions: Per prior PT note pt is NWB LUE. Unclear if this is true. Family reports that  she was told that as of Friday she could be out of sling    Mobility  Bed Mobility               General bed mobility comments: up in recliner when PT entered  Transfers Overall transfer level: Needs assistance Equipment used: 1 person hand held assist Transfers: Sit to/from Stand Sit to Stand: Min guard         General transfer comment: Pt provided verbal cues for RUE placement during transfer. Assist under RUE and HHA of RUE  Ambulation/Gait Ambulation/Gait assistance: Min assist Ambulation Distance (Feet): 60 Feet Assistive device: 1 person hand held assist (RUE) Gait Pattern/deviations: Step-through pattern;Decreased step length - right;Decreased step length - left;Shuffle Gait velocity: reduced Gait velocity interpretation: <1.8 ft/sec, indicative of risk for recurrent falls General Gait Details: Overall decreased speed with increased lateral sway. Therapist provides minA+1 correction for balance. Poor safety awareness due to baseline dementia   Stairs            Wheelchair Mobility    Modified Rankin (Stroke Patients Only)       Balance Overall balance assessment: Needs assistance Sitting-balance support: Single extremity supported Sitting balance-Leahy Scale: Good     Standing balance support: Single extremity supported Standing balance-Leahy Scale: Fair                      Cognition Arousal/Alertness: Awake/alert Behavior During Therapy: WFL for tasks assessed/performed;Flat affect (confused) Overall Cognitive Status: History of cognitive impairments - at baseline (dementia)       Memory: Decreased short-term memory;Decreased recall of  precautions              Exercises General Exercises - Lower Extremity Long Arc Quad: Strengthening;10 reps;Both;Seated Heel Slides: Strengthening;10 reps;Both;Seated Hip ABduction/ADduction: Strengthening;Both;10 reps;Seated Hip Flexion/Marching: Strengthening;10 reps;Both;Seated Heel  Raises: Strengthening;Both;10 reps;Seated    General Comments        Pertinent Vitals/Pain Pain Assessment:  (Pt denies. No signs of pain present)    Home Living                      Prior Function            PT Goals (current goals can now be found in the care plan section) Acute Rehab PT Goals Patient Stated Goal: none stated Time For Goal Achievement: 04/15/15 Potential to Achieve Goals: Fair Progress towards PT goals: Progressing toward goals    Frequency  Min 2X/week    PT Plan Current plan remains appropriate    Co-evaluation             End of Session Equipment Utilized During Treatment: Gait belt;Oxygen (2 L/min during gait) Activity Tolerance: Patient tolerated treatment well;Patient limited by fatigue (Limited due to cognitive status) Patient left: in chair;with call bell/phone within reach;with family/visitor present;with chair alarm set     Time: 1450-1520 PT Time Calculation (min) (ACUTE ONLY): 30 min  Charges:  $Therapeutic Exercise: 23-37 mins                    G Codes:      Lynnea Maizes, PT  Mikeala Girdler 04/06/2015, 4:03 PM

## 2015-04-06 NOTE — Progress Notes (Signed)
Pt alert, but confused to time and situation.  Pt c/o pain, medicated per MAR.  Pt refused night meds and stated that she just wanted to sleep.  Pt slept most of the night.  Pt having no shortness of breath at rest.  O2 sats remain 99-100% on 1-2LNC.  Pt had no other complaints or concerns.  Cristela Felt, RN

## 2015-04-06 NOTE — Progress Notes (Signed)
[facility] - [place] at [facility]   PATIENT NAME: Laura Jefferson    MR#:  [id number]  DATE OF BIRTH:  May 20, 1925  SUBJECTIVE:  CHIEF COMPLAINT:   Chief Complaint  Patient presents with  . Respiratory Distress    Pt breathing improved, sitting in chair   REVIEW OF SYSTEMS:  CONSTITUTIONAL: No fever, fatigue or weakness.  RESPIRATORY: Positive for shortness of breath, No wheezing or hemoptysis.  CARDIOVASCULAR: No chest pain, orthopnea, edema.  GASTROINTESTINAL: No nausea, vomiting, diarrhea or abdominal pain.  GENITOURINARY: No dysuria, hematuria.  NEUROLOGIC: No tingling, numbness, weakness.  PSYCHIATRY: No anxiety or depression.   DRUG ALLERGIES:  No Known Allergies  VITALS:  Blood pressure 133/41, pulse 76, temperature 98.8 F (37.1 C), temperature source Oral, resp. rate 16, height 5' (1.524 m), weight 49.261 kg (108 lb 9.6 oz), SpO2 100 %.  PHYSICAL EXAMINATION:  GENERAL:  81 y.o.-year-old patient lying in the bed with no acute distress.  EYES: Pupils equal, round, reactive to light and accommodation. No scleral icterus. Extraocular muscles intact.  HEENT: Head atraumatic, normocephalic. Oropharynx and nasopharynx clear.  NECK:  Supple, no jugular venous distention. No thyroid enlargement, no tenderness.  LUNGS: Normal breath sounds bilaterally, no wheezing, fine  Rales at the bases. No use of accessory muscles of respiration.  CARDIOVASCULAR: S1, S2 normal. 3/6 systolic murmur present, no gallops  ABDOMEN: Soft, nontender, nondistended. Bowel sounds present. No organomegaly or mass.  EXTREMITIES: No pedal edema, cyanosis, or clubbing. Wounds with dressings noted on right shin and calf NEUROLOGIC: Cranial nerves II through XII are intact. Muscle strength intact in all extremities. Left arm in sling. Sensation intact. Gait not checked.  PSYCHIATRIC: The patient is alert and oriented x 2. Has dementia at baseline. SKIN: No obvious rash, lesion,  or ulcer. Skin tear lesions on right shin and calf   LABORATORY PANEL:   CBC  Recent Labs Lab 04/06/15 0456  WBC 9.3  HGB 8.3*  HCT 26.5*  PLT 205   ------------------------------------------------------------------------------------------------------------------  Chemistries   Recent Labs Lab 04/01/15 0707  04/06/15 0456  NA 140  < > 144  K 3.9  < > 3.8  CL 100*  < > 102  CO2 29  < > 31  GLUCOSE 371*  < > 103*  BUN 66*  < > 60*  CREATININE 1.62*  < > 1.84*  CALCIUM 8.2*  < > 8.6*  AST 18  --   --   ALT 15  --   --   ALKPHOS 98  --   --   BILITOT 0.6  --   --   < > = values in this interval not displayed. ------------------------------------------------------------------------------------------------------------------  Cardiac Enzymes  Recent Labs Lab 04/01/15 0707  TROPONINI 0.05*   ------------------------------------------------------------------------------------------------------------------  RADIOLOGY:  No results found.  EKG:   Orders placed or performed during the hospital encounter of 04/01/15  . EKG 12-Lead  . EKG 12-Lead  . EKG 12-Lead  . EKG 12-Lead    ASSESSMENT AND PLAN:   81 year old female with past medical history significant for congestive heart failure last known ejection fraction of 40%, diabetes mellitus, CK D with baseline creatinine of 1.8, recent and non-ST segment elevation MI, and left humerus fracture was discharged from rehabilitation to home yesterday presents to the hospital secondary to worsening respiratory distress.   #1 Acute respiratory distress- secondary to acute on chronic systolic CHF exacerbation- Improved today Continue lasix at current dose Cardiology following. The echo was just  done last admission in April 2016 and EF is 40-45%. on metoprolol low dose. not on any ACEI or ARB due to chronic kidney disease. Patient was seen by Palliative care in the past- but family not interested in Hospice and wants to get  patient to rehab.  #2. Leukocytosis- no evidence of infection, levaquin was added with improvement in wbc. Monitor for now Finish 5 day course for possible bronchitis  #3. Anemia- baseline hb around 7, started patient on iron therapy. hgb improved today  #4. DM- on lantus, glipizide and ssi  #5. COPD- on 2l oxygen at home, cont. On spiriva- continue  #6. CKD- stable, monitor while being diuresed  #7. DVT prophylaxis- lovenox  Pt re eval, daugher intrested in rehab    All the records are reviewed and case discussed with Care Management/Social Workerr. Management plans discussed with the patient, family and they are in agreement.  CODE STATUS: DNR  TOTAL TIME TAKING CARE OF THIS PATIENT62min minutes.   DISCHARGE IN 2-3 DAYS, DEPENDING ON CLINICAL CONDITION.   Auburn Bilberry M.D on 04/06/2015 at 12:39 PM  Between 7am to 6pm - Pager - [phone]  After 6pm go to [email or web address] - password [email or web address] [facility]  [facility]  Office  [phone]  CC: Primary care physician; Marcine Matar, MD

## 2015-04-07 ENCOUNTER — Inpatient Hospital Stay: Payer: Medicare PPO

## 2015-04-07 LAB — BASIC METABOLIC PANEL
Anion gap: 11 (ref 5–15)
BUN: 70 mg/dL — ABNORMAL HIGH (ref 6–20)
CALCIUM: 8.7 mg/dL — AB (ref 8.9–10.3)
CO2: 30 mmol/L (ref 22–32)
Chloride: 100 mmol/L — ABNORMAL LOW (ref 101–111)
Creatinine, Ser: 1.89 mg/dL — ABNORMAL HIGH (ref 0.44–1.00)
GFR calc Af Amer: 26 mL/min — ABNORMAL LOW (ref >60)
GFR calc non Af Amer: 23 mL/min — ABNORMAL LOW (ref >60)
Glucose, Bld: 224 mg/dL — ABNORMAL HIGH (ref 65–99)
Potassium: 4 mmol/L (ref 3.5–5.1)
SODIUM: 141 mmol/L (ref 135–145)

## 2015-04-07 LAB — GLUCOSE, CAPILLARY
GLUCOSE-CAPILLARY: 108 mg/dL — AB (ref 70–99)
Glucose-Capillary: 165 mg/dL — ABNORMAL HIGH (ref 70–99)
Glucose-Capillary: 89 mg/dL (ref 70–99)
Glucose-Capillary: 193 mg/dL — ABNORMAL HIGH (ref 70–99)

## 2015-04-07 LAB — CBC
HCT: 25.5 % — ABNORMAL LOW (ref 35.0–47.0)
Hemoglobin: 8 g/dL — ABNORMAL LOW (ref 12.0–16.0)
MCH: 28.8 pg (ref 26.0–34.0)
MCHC: 31.2 g/dL — ABNORMAL LOW (ref 32.0–36.0)
MCV: 92.3 fL (ref 80.0–100.0)
PLATELETS: 194 10*3/uL (ref 150–440)
RBC: 2.76 MIL/uL — ABNORMAL LOW (ref 3.80–5.20)
RDW: 15.4 % — AB (ref 11.5–14.5)
WBC: 10.3 10*3/uL (ref 3.6–11.0)

## 2015-04-07 MED ORDER — FUROSEMIDE 10 MG/ML IJ SOLN
40.0000 mg | Freq: Two times a day (BID) | INTRAMUSCULAR | Status: DC
  Administered 2015-04-07 – 2015-04-09 (×5): 40 mg via INTRAVENOUS
  Filled 2015-04-07 (×5): qty 4

## 2015-04-07 NOTE — Progress Notes (Signed)
Pt became SOB after taking off her oxygen.  Placed O2 back on pt and increased from 2L to 3L and pt's O2 sats returned to 95%.  Pt continued having labored breathing.  RT Kim notified and breathing treatment given.  Pt rested comfortably afterward.

## 2015-04-07 NOTE — Progress Notes (Signed)
Confused however cooperative. Daughter at bedside. Patient assisted back to bed, currently on 02 at 2l, no distress noted. Telemetry reading nsr. Bed alarm in use will continue to monitor  [facility]

## 2015-04-07 NOTE — Consult Note (Signed)
Palliative Medicine Inpatient Consult Note   Name: Laura Jefferson Date: 04/07/2015 MRN: [id number]  DOB: [birth year]  Referring Physician: Auburn Bilberry, MD  Palliative Care consult requested for this 81 y.o. female for goals of medical therapy in patient with dementia, mixed CM, anxiety, T2DM, CKD, COPD (2L [place] cont), HLD, h/o NSTEMI, and L humerus fracture . Pt presented to ER on 04-01-2015 with respiratory distress.  ER workup significant for leukocytosis and CHF exacerbation.  Pt admitted for evaluation and treatment.  Family not at beside.  Pt has one daughter, Laura Jefferson, and one son, Laura Jefferson.  Pt advanced directives state Laura Jefferson as HCPOA   REVIEW OF SYSTEMS:  Patient is not able to provide ROS  .  SOCIAL HISTORY:  reports that she quit smoking about 6 weeks ago. Her smoking use included Cigarettes. She quit after 70 years of use. She does not have any smokeless tobacco history on file. She reports that she does not drink alcohol or use illicit drugs.  LEGAL DOCUMENTS:  Health Care Power of Attorney:  daughter, Laura Jefferson.  CODE STATUS: DNR  PAST MEDICAL HISTORY: Past Medical History  Diagnosis Date  . Muscle weakness   . Non-ST elevated myocardial infarction     a. likely demand ischemia 02/2015 in the setting of ARF and acute on chronic CHF  . Humerus fracture     a. left, b. 02/2015; c. in the setting of increased weakness  . Dysphagia   . Anemia   . Paroxysmal a-fib     a. reported a-fib, no documented EKGs showing this  . Chronic systolic CHF (congestive heart failure)     a. echo 02/2015: EF 40-45%, mod dilated LA, mildly dilated RA, mod MR/TR, mildly elevated PASP 43 mm Hg, no evidence of intracardiac shunting, intact interatrial and interventricular septa  . COPD (chronic obstructive pulmonary disease)     a. on home O2  . Hypertensive chronic kidney disease   . Diabetes mellitus without complication   . PVD (peripheral vascular disease)   . Dementia   . Anxiety disorder     PAST  SURGICAL HISTORY:  Past Surgical History  Procedure Laterality Date  . Arm surgery    . Peripheral arterial stent graft      ALLERGIES:  has No Known Allergies.  MEDICATIONS:  Current Facility-Administered Medications  Medication Dose Route Frequency Provider Last Rate Last Dose  . 0.9 %  sodium chloride infusion  250 mL Intravenous PRN Auburn Bilberry, MD      . acetaminophen (TYLENOL) tablet 650 mg  650 mg Oral Q6H PRN Auburn Bilberry, MD       Or  . acetaminophen (TYLENOL) suppository 650 mg  650 mg Rectal Q6H PRN Auburn Bilberry, MD      . alum & mag hydroxide-simeth (MAALOX/MYLANTA) 200-200-20 MG/5ML suspension 30 mL  30 mL Oral Q6H PRN Auburn Bilberry, MD      . antiseptic oral rinse (CPC / CETYLPYRIDINIUM CHLORIDE 0.05%) solution 7 mL  7 mL Mouth Rinse BID Auburn Bilberry, MD   7 mL at 04/06/15 0951  . aspirin EC tablet 81 mg  81 mg Oral Daily Auburn Bilberry, MD   81 mg at 04/07/15 1130  . docusate sodium (COLACE) capsule 100 mg  100 mg Oral BID PRN Auburn Bilberry, MD   100 mg at 04/02/15 1243  . enoxaparin (LOVENOX) injection 30 mg  30 mg Subcutaneous Q24H Auburn Bilberry, MD   30 mg at 04/07/15 1130  . feeding supplement (PRO-STAT SUGAR  FREE 64) liquid 30 mL  30 mL Oral BID BM Auburn Bilberry, MD   30 mL at 04/07/15 1000  . ferrous sulfate tablet 325 mg  325 mg Oral TID WC Auburn Bilberry, MD   325 mg at 04/07/15 1130  . folic acid (FOLVITE) tablet 0.5 mg  0.5 mg Oral Daily Auburn Bilberry, MD   0.5 mg at 04/07/15 0759  . furosemide (LASIX) injection 40 mg  40 mg Intravenous BID Auburn Bilberry, MD   40 mg at 04/07/15 1130  . glipiZIDE (GLUCOTROL XL) 24 hr tablet 2.5 mg  2.5 mg Oral Q breakfast Auburn Bilberry, MD   2.5 mg at 04/07/15 0759  . GLUCERNA (GLUCERNA) liquid 237 mL  237 mL Oral BID BM Auburn Bilberry, MD   237 mL at 04/07/15 1000  . HYDROcodone-acetaminophen (NORCO/VICODIN) 5-325 MG per tablet 1 tablet  1 tablet Oral Q6H PRN Auburn Bilberry, MD   1 tablet at 04/06/15 [phone]  .  insulin aspart (novoLOG) injection 0-20 Units  0-20 Units Subcutaneous TID WC Auburn Bilberry, MD   4 Units at 04/07/15 0802  . insulin aspart (novoLOG) injection 0-5 Units  0-5 Units Subcutaneous QHS Wyatt Haste, MD   2 Units at 04/06/15 2224  . insulin glargine (LANTUS) injection 10 Units  10 Units Subcutaneous Daily Auburn Bilberry, MD   10 Units at 04/07/15 0802  . ipratropium-albuterol (DUONEB) 0.5-2.5 (3) MG/3ML nebulizer solution 3 mL  3 mL Nebulization Q6H PRN Auburn Bilberry, MD   3 mL at 04/07/15 0015  . levofloxacin (LEVAQUIN) tablet 250 mg  250 mg Oral Q48H Enid Baas, MD   250 mg at 04/07/15 1139  . megestrol (MEGACE) 400 MG/10ML suspension 400 mg  400 mg Oral Daily Auburn Bilberry, MD   400 mg at 04/07/15 0801  . metoprolol succinate (TOPROL-XL) 24 hr tablet 25 mg  25 mg Oral Daily Laurey Morale, MD   25 mg at 04/06/15 2225  . nicotine (NICODERM CQ - dosed in mg/24 hr) patch 7 mg  7 mg Transdermal Daily Auburn Bilberry, MD   7 mg at 04/07/15 0803  . ondansetron (ZOFRAN) tablet 4 mg  4 mg Oral Q6H PRN Auburn Bilberry, MD       Or  . ondansetron (ZOFRAN) injection 4 mg  4 mg Intravenous Q6H PRN Auburn Bilberry, MD      . PARoxetine (PAXIL) tablet 20 mg  20 mg Oral Daily Auburn Bilberry, MD   20 mg at 04/07/15 0759  . senna (SENOKOT) tablet 8.6 mg  1 tablet Oral BID PRN Auburn Bilberry, MD   8.6 mg at 04/06/15 0950  . simvastatin (ZOCOR) tablet 20 mg  20 mg Oral Daily Auburn Bilberry, MD   20 mg at 04/07/15 0800  . sodium chloride 0.9 % injection 3 mL  3 mL Intravenous Q12H Auburn Bilberry, MD   3 mL at 04/06/15 1157  . sodium chloride 0.9 % injection 3 mL  3 mL Intravenous PRN Auburn Bilberry, MD   3 mL at 04/07/15 1130  . tiotropium (SPIRIVA) inhalation capsule 18 mcg  18 mcg Inhalation Daily Auburn Bilberry, MD   18 mcg at 04/06/15 0827  . vitamin B-12 (CYANOCOBALAMIN) tablet 1,000 mcg  1,000 mcg Oral Daily Auburn Bilberry, MD   1,000 mcg at 04/07/15 0800  . Vitamin D 2,000 Units  2,000  Units Oral Daily Auburn Bilberry, MD   2,000 Units at 04/07/15 0800    Vital Signs: BP 140/49 mmHg  Pulse 61  Temp(Src) 97.5 F (36.4 C) (Oral)  Resp 18  Ht 5' (1.524 m)  Wt 49.533 kg (109 lb 3.2 oz)  BMI 21.33 kg/m2  SpO2 100% Filed Weights   04/04/15 1938 04/06/15 0612 04/07/15 0514  Weight: 51.347 kg (113 lb 3.2 oz) 49.261 kg (108 lb 9.6 oz) 49.533 kg (109 lb 3.2 oz)    Estimated body mass index is 21.33 kg/(m^2) as calculated from the following:   Height as of this encounter: 5' (1.524 m).   Weight as of this encounter: 49.533 kg (109 lb 3.2 oz).  PERFORMANCE STATUS (ECOG) : 3 - Symptomatic, >50% confined to bed  PHYSICAL EXAM: General appearance: no distress and slowed mentation Head: Normocephalic, without obvious abnormality, atraumatic Neck: supple, symmetrical, trachea midline and thyroid not enlarged, symmetric, no tenderness/mass/nodules Resp: diffuse crackles noted to bilateral anterior fields Cardio: regular rate and rhythm, S1, S2 normal, no murmur, click, rub or gallop GI: soft, non-tender; bowel sounds normal; no masses,  no organomegaly Extremities: extremities normal, atraumatic, no cyanosis or edema Neurologic: Mental status: Alert, oriented, thought content appropriate, pt lethargic, responsive to stimuli but falls back asleep  LABS: CBC:    Component Value Date/Time   WBC 10.3 04/07/2015 0446   WBC 10.2 03/11/2015 0749   HGB 8.0* 04/07/2015 0446   HGB 8.2* 03/11/2015 0749   HCT 25.5* 04/07/2015 0446   HCT 26.4* 03/11/2015 0749   PLT 194 04/07/2015 0446   PLT 244 03/11/2015 0749   MCV 92.3 04/07/2015 0446   MCV 93 03/11/2015 0749   NEUTROABS 7.4* 03/11/2015 0749   LYMPHSABS 1.6 03/11/2015 0749   MONOABS 0.8 03/11/2015 0749   EOSABS 0.3 03/11/2015 0749   BASOSABS 0.0 03/11/2015 0749   Comprehensive Metabolic Panel:    Component Value Date/Time   NA 141 04/07/2015 0446   NA 143 03/11/2015 0749   K 4.0 04/07/2015 0446   K 3.9 03/11/2015 0749    CL 100* 04/07/2015 0446   CL 109 03/11/2015 0749   CO2 30 04/07/2015 0446   CO2 27 03/11/2015 0749   BUN 70* 04/07/2015 0446   BUN 47* 03/11/2015 0749   CREATININE 1.89* 04/07/2015 0446   CREATININE 1.48* 03/11/2015 0749   GLUCOSE 224* 04/07/2015 0446   GLUCOSE 209* 03/11/2015 0749   CALCIUM 8.7* 04/07/2015 0446   CALCIUM 8.0* 03/11/2015 0749   AST 18 04/01/2015 0707   AST 18 03/07/2015 1730   ALT 15 04/01/2015 0707   ALT 12* 03/07/2015 1730   ALKPHOS 98 04/01/2015 0707   ALKPHOS 68 03/07/2015 1730   BILITOT 0.6 04/01/2015 0707   PROT 7.0 04/01/2015 0707   PROT 6.1* 03/07/2015 1730   ALBUMIN 3.0* 04/01/2015 0707   ALBUMIN 2.5* 03/07/2015 1730    IMPRESSION:  Palliative Care consult requested for this 81 y.o. female for goals of medical therapy in patient with dementia, mixed CM, anxiety, T2DM, CKD, COPD (2L [place] cont), HLD, h/o NSTEMI, and L humerus fracture . Pt presented to ER on 04-01-2015 with respiratory distress.  ER workup significant for leukocytosis and CHF exacerbation.  Pt admitted for evaluation and treatment.  Family not at beside.  Pt resting in bed currently and sleepipng.  Pt responsive to stimuli however falls back asleep and unable to carry on conversation.  Plan appears for pt to go to STR per previous notes.  Pt ate 100% of last meal charted.  Daughter not at bedside and unable to reach by phone.  Will cont to attempt to discuss additional  goals of care.  PLAN: 1. Goals of care conversation with daughter when available     More than 50% of the visit was spent in counseling/coordination of care: Yes  Time Spent: 75 minutes

## 2015-04-07 NOTE — Progress Notes (Signed)
Physical Therapy Treatment Patient Details Name: Laura Jefferson MRN: [id number] DOB: 08-Oct-1925 Today's Date: 04/07/2015    History of Present Illness 81 yo female with onset of SOB after recent SNF discharge on home O2.  Recent L humeral fracture and NWB, demand ischemia with elevated troponin, acute respiratory failure.  PMHx:  CKD, PAF, NSTEMI, EF 40-45%, anxiety, PVD, dementia, COPD, DM, CHF    PT Comments    Pt was seen for evaluation of her ability to walk but did not remove sling as order was not on chart yet with information.  Spoke with PT and will retry her with RW based on conversation with Dr. Hyacinth Meeker giving permission to now remove sling and do ROM.  Will see if RW is more stable gait option but still anticipate 24/7 needed at home.  Follow Up Recommendations  Home health PT;Supervision/Assistance - 24 hour     Equipment Recommendations  None recommended by PT    Recommendations for Other Services       Precautions / Restrictions Precautions Precautions: Fall Precaution Comments: telemetry Required Braces or Orthoses: Sling Restrictions Weight Bearing Restrictions: No Other Position/Activity Restrictions: Per new PT report from contact with MD    Mobility  Bed Mobility Overal bed mobility: Needs Assistance Bed Mobility: Supine to Sit     Supine to sit: Min assist;Mod assist        Transfers Overall transfer level: Needs assistance Equipment used: 1 person hand held assist Transfers: Sit to/from [employer] Sit to Stand: Min guard Stand pivot transfers: Min assist;Min guard       General transfer comment: assisted her to stand with reminders about R hand placement and body mechanics with practice to commode  Ambulation/Gait Ambulation/Gait assistance: Min assist;Min guard Ambulation Distance (Feet): 20 Feet Assistive device: 1 person hand held assist Gait Pattern/deviations: Step-through pattern;Decreased stride length;Wide base of  support;Drifts right/left Gait velocity: reduced Gait velocity interpretation: Below normal speed for age/gender General Gait Details: baseline dementia but also may have UTI, due to odor in urine.  Spoke with CNA and nursing busy, to convey   Stairs            Wheelchair Mobility    Modified Rankin (Stroke Patients Only)       Balance Overall balance assessment: Needs assistance Sitting-balance support: Feet supported Sitting balance-Leahy Scale: Good     Standing balance support: Single extremity supported Standing balance-Leahy Scale: Fair Standing balance comment: fair- dynamic                    Cognition Arousal/Alertness: Awake/alert Behavior During Therapy: Flat affect (confused) Overall Cognitive Status: History of cognitive impairments - at baseline       Memory: Decreased recall of precautions;Decreased short-term memory              Exercises      General Comments General comments (skin integrity, edema, etc.): Pt is up in chair with controlled sitting, talked with daughter by phone who was unable to come over.        Pertinent Vitals/Pain Pain Assessment: No/denies pain    Home Living                      Prior Function            PT Goals (current goals can now be found in the care plan section) Acute Rehab PT Goals Patient Stated Goal: None Progress towards PT goals: Progressing toward goals  Frequency  Min 2X/week    PT Plan Current plan remains appropriate    Co-evaluation             End of Session Equipment Utilized During Treatment: Gait belt;Oxygen Activity Tolerance: Patient tolerated treatment well Patient left: with call bell/phone within reach;in chair;with chair alarm set     Time: 1201-1226 PT Time Calculation (min) (ACUTE ONLY): 25 min  Charges:  $Gait Training: 8-22 mins $Therapeutic Activity: 8-22 mins                    G Codes:      Ivar Drape 04/30/15, 1:38 PM   Samul Dada, PT MS Acute Rehab Dept. Number: [phone]

## 2015-04-07 NOTE — Progress Notes (Signed)
Patient: Laura Jefferson / Admit Date: 04/01/2015 / Date of Encounter: 04/07/2015, 12:45 PM   Subjective: Up sitting in the recliner. On 2L O2 via nasal cannula. Back on IV Lasix 40 mg bid 2/2 decline earlier. States she is feeling better. Does not want to eat lunch.   Review of Systems: Review of Systems  Constitutional: Positive for weight loss and malaise/fatigue. Negative for fever, chills and diaphoresis.  Respiratory: Positive for cough, shortness of breath and wheezing. Negative for hemoptysis and sputum production.   Cardiovascular: Negative for chest pain, palpitations, orthopnea, claudication, leg swelling and PND.  Gastrointestinal: Positive for nausea. Negative for vomiting and abdominal pain.  Neurological: Positive for weakness.    Objective: Telemetry: NSR, 70s, frequent ventricular bigeminy  Physical Exam: Blood pressure 140/49, pulse 61, temperature 97.5 F (36.4 C), temperature source Oral, resp. rate 18, height 5' (1.524 m), weight 109 lb 3.2 oz (49.533 kg), SpO2 100 %. Body mass index is 21.33 kg/(m^2). General: Frail appearing, in no acute distress. Head: Normocephalic, atraumatic, sclera non-icteric, no xanthomas, nares are without discharge. Neck: Negative for carotid bruits. JVP not elevated. Lungs: Bilateral crackles at the bases without wheezes or rhonchi. Breathing is unlabored on 2L O2 via [place]. Heart: RRR S1 S2 without murmurs, rubs, or gallops.  Abdomen: Soft, non-tender, non-distended with normoactive bowel sounds. No rebound/guarding. Extremities: No clubbing or cyanosis. No edema.  Neuro: Much more alert today. Moves all extremities spontaneously. Psych:  Responds to questions appropriately with a normal affect.   Intake/Output Summary (Last 24 hours) at 04/07/15 1245 Last data filed at 04/06/15 1630  Gross per 24 hour  Intake    640 ml  Output    150 ml  Net    490 ml    Inpatient Medications:  . antiseptic oral rinse  7 mL Mouth Rinse BID  .  aspirin EC  81 mg Oral Daily  . enoxaparin (LOVENOX) injection  30 mg Subcutaneous Q24H  . feeding supplement (PRO-STAT SUGAR FREE 64)  30 mL Oral BID BM  . ferrous sulfate  325 mg Oral TID WC  . folic acid  0.5 mg Oral Daily  . furosemide  40 mg Intravenous BID  . glipiZIDE  2.5 mg Oral Q breakfast  . GLUCERNA  237 mL Oral BID BM  . insulin aspart  0-20 Units Subcutaneous TID WC  . insulin aspart  0-5 Units Subcutaneous QHS  . insulin glargine  10 Units Subcutaneous Daily  . levofloxacin  250 mg Oral Q48H  . megestrol  400 mg Oral Daily  . metoprolol succinate  25 mg Oral Daily  . nicotine  7 mg Transdermal Daily  . PARoxetine  20 mg Oral Daily  . simvastatin  20 mg Oral Daily  . sodium chloride  3 mL Intravenous Q12H  . tiotropium  18 mcg Inhalation Daily  . vitamin B-12  1,000 mcg Oral Daily  . Vitamin D  2,000 Units Oral Daily   Infusions:    Labs:  Recent Labs  04/06/15 0456 04/07/15 0446  NA 144 141  K 3.8 4.0  CL 102 100*  CO2 31 30  GLUCOSE 103* 224*  BUN 60* 70*  CREATININE 1.84* 1.89*  CALCIUM 8.6* 8.7*   No results for input(s): AST, ALT, ALKPHOS, BILITOT, PROT, ALBUMIN in the last 72 hours.  Recent Labs  04/06/15 0456 04/07/15 0446  WBC 9.3 10.3  HGB 8.3* 8.0*  HCT 26.5* 25.5*  MCV 91.5 92.3  PLT 205  194   No results for input(s): CKTOTAL, CKMB, TROPONINI in the last 72 hours. Invalid input(s): POCBNP No results for input(s): HGBA1C in the last 72 hours.   Weights: Filed Weights   04/04/15 1938 04/06/15 0612 04/07/15 0514  Weight: 113 lb 3.2 oz (51.347 kg) 108 lb 9.6 oz (49.261 kg) 109 lb 3.2 oz (49.533 kg)     Radiology/Studies:  Ct Chest Wo Contrast  04/07/2015   CLINICAL DATA:  81 year old female with past medical history significant for congestive heart failure last known ejection fraction of 40%, diabetes mellitus, CK D with baseline creatinine of 1.8, recent and non-ST segment elevation MI, and left humerus fracture was discharged from  rehabilitation to home yesterday presents to the hospital secondary to worsening respiratory distress.  EXAM: CT CHEST WITHOUT CONTRAST  TECHNIQUE: Multidetector CT imaging of the chest was performed following the standard protocol without IV contrast.  COMPARISON:  04/01/2015  FINDINGS: Thoracic inlet: No mass or adenopathy. Calcification in the right thyroid lobe.  Mediastinum and hila: Mild enlargement of the heart. Trace amount pericardial fluid. There are dense coronary artery calcifications. Mild enlargement of the pulmonary arteries, right measuring 27 mm in left measuring 28 mm. Aorta is normal in caliber. There is dense atherosclerotic calcification along the thoracic aorta and the aortic arch branch vessels. No mediastinal or hilar masses or pathologically enlarged lymph nodes.  Lungs and pleura: Mild to moderate changes of centrilobular emphysema. There is dependent opacity adjacent to the obliques fissures in both upper lobes most consistent with subsegmental atelectasis. There is a greater degree of atelectasis in the lower lobes adjacent to small bilateral pleural effusions. Bronchial wall thickening is noted in the right middle lobe, left upper lobe lingula and both lower lobes, which may be chronic. Consider bronchitis if there are consistent clinical symptoms. No evidence of lobar pneumonia. There is mild interstitial prominence most evident in the upper lobes. This is most likely chronic. A minor degree of interstitial edema is possible.  Limited upper abdomen: 12 mm left adrenal adenoma. Diffuse vascular calcifications. No acute finding.  Musculoskeletal: Minor depression of the upper endplate of T7. Mild compression fracture of T11. These fractures are likely chronic. Bones are demineralized. No osteoblastic or osteolytic lesions.  IMPRESSION: 1. There is cardiomegaly, small bilateral pleural effusions, mild upper lobe interstitial thickening and a trace amount of pericardial fluid. Mild  congestive heart failure is suggested. 2. No evidence of pneumonia. 3. Emphysema and dependent bilateral lung atelectasis.   Electronically Signed   By: Amie Portland M.D.   On: 04/07/2015 11:10   Dg Chest Portable 1 View  04/01/2015   CLINICAL DATA:  Respiratory distress.  EXAM: PORTABLE CHEST - 1 VIEW  COMPARISON:  03/04/2015.  FINDINGS: Cardiomegaly with aortic atherosclerosis persists. There is mild vascular congestion and BILATERAL pleural effusions consistent with early CHF. Compared with priors, the degree of failure is not as severe. Chronic osseous changes are stable.  IMPRESSION: Early congestive failure.   Electronically Signed   By: Davonna Belling M.D.   On: 04/01/2015 07:51     Assessment and Plan  81 y.o. female with past medical history significant for congestive heart failure last known ejection fraction of 40%, diabetes mellitus, CKD with baseline creatinine of 1.8, recent non-ST segment elevation MI, and left humerus fracture was presented to the hospital secondary to worsening respiratory distress.  1. Acute respiratory failure: Suspect acute on chronic systolic CHF/flash pulmonary edema upon admission with repeat flare overnight on 5/5 requiring  extra dose of IV Lasix 40 mg and BiPAP.Currently weaned off BiPAP and on 2L O2 via nasal cannula.She looks euvolemic on exam today. She is much more alert upon exam today as well. She actually diuresed a lot over the weekend on Lasix 40 mg daily, with some elevation in SCr.  -Lasix was changed back to IV this morning by primary team 2/2 worsening symptoms, this afternoon she is up sitting in her chair and appears the best I have seen her this admission  -SCr continues to slowly rise  -May need transition to po Lasix in next 24 hours given the above rise in SCr -At discharge probably best to go back to Lasix 20 mg po bid given significant diuresis yesterday on Lasix 40 po daily with rise in creatinine.  -Continue Toprol XL 25 daily.   -Recent echo 02/2015 with EF 40-45%, PASP 43 mm Hg, will not repeat at this time  2. Elevated troponin:  -Mildly elevated at 0.05. Likely demand ischemia in the setting of #1 and CKD.  -Previous admission she was deemed to be not an invasive candidate given her dementia, advanced renal insufficiency and anemia.  -Continue medical management with aspirin and simvastatin.   3. Ventricular bigeminy/trigeminy:  -Continue Toprol XL. -Plan for outpatient 30 day event monitor at discharge if she or her daughter would like   4. Leukocytosis: -Admitted with WBC count of 21.7 as above -Likely inflammatory in the setting of #1, resolved  -On Levaquin -Blood cultures no growth   5. Acute on CKD stage IV: Creatinine slightly higher today at 1.8.  -Avoid ACEi and ARBs given renal insufficiency  6. COPD: -Home O2 -Off BiPAP currently -Continue O2 via nasal cannula   7. IDDM: -SSI per IM  8. Left arm fracture: -In the setting of increased weakness -Continue current treatment as advised by treating team  9. Dispo: -She appears too weak for independent living as her daughter would like => needs evaluation for rehab stay.  -She is a DNR   Signed, Eula Listen, PA-C 04/07/2015 1:01 PM

## 2015-04-07 NOTE — Progress Notes (Signed)
Nutrition Follow-up  DOCUMENTATION CODES:     INTERVENTION:  Medical Nutrition Supplement: Continue glucerna BID for added nutrition Meals and snacks: Cater to pt prefences  NUTRITION DIAGNOSIS:  No nutritional concerns at this time    GOAL:  Patient will meet greater than or equal to 90% of their needs    MONITOR:  PO intake, Supplement acceptance, Weight trends, Skin, I & O's  REASON FOR ASSESSMENT:   (follow-up)    ASSESSMENT:  Po intake 50-100% of meals. Pt could not remember what she ate today for lunch.  RN, Amy reports drinking glucerna. CNA reports typically does not eat breakfast but that is normal for her.    Height:  Ht Readings from Last 1 Encounters:  04/01/15 5' (1.524 m)    Weight:  Wt Readings from Last 1 Encounters:  04/07/15 109 lb 3.2 oz (49.533 kg)    Ideal Body Weight:     Wt Readings from Last 10 Encounters:  04/07/15 109 lb 3.2 oz (49.533 kg)  03/27/15 112 lb 7 oz (51.001 kg)    BMI:  Body mass index is 21.33 kg/(m^2).  Estimated Nutritional Needs:  Kcal:     Protein:     Fluid:     Skin:  Reviewed, no issues  Diet Order:  Diet heart healthy/carb modified Room service appropriate?: Yes; Fluid consistency:: Thin  EDUCATION NEEDS:  No education needs identified at this time   Intake/Output Summary (Last 24 hours) at 04/07/15 1528 Last data filed at 04/06/15 1630  Gross per 24 hour  Intake    240 ml  Output      0 ml  Net    240 ml    Last BM:  5/09  Low level of care  Josephine Wooldridge B. Freida Busman, RD, LDN [phone] (pager)

## 2015-04-07 NOTE — Progress Notes (Addendum)
[facility] [place] [facility]   PATIENT NAME: Laura Jefferson    MR#:  [id number]  DATE OF BIRTH:  January 14, 1925  SUBJECTIVE:  CHIEF COMPLAINT:   Chief Complaint  Patient presents with  . Respiratory Distress    Back on 5l o2, currently drowsy   REVIEW OF SYSTEMS:  CONSTITUTIONAL: No fever, fatigue or weakness.  RESPIRATORY: Positive for shortness of breath, No wheezing or hemoptysis.  CARDIOVASCULAR: No chest pain, orthopnea, edema.  GASTROINTESTINAL: No nausea, vomiting, diarrhea or abdominal pain.  GENITOURINARY: No dysuria, hematuria.  NEUROLOGIC: No tingling, numbness, weakness.  PSYCHIATRY: No anxiety or depression.   DRUG ALLERGIES:  No Known Allergies  VITALS:  Blood pressure 140/49, pulse 61, temperature 97.5 F (36.4 C), temperature source Oral, resp. rate 18, height 5' (1.524 m), weight 49.533 kg (109 lb 3.2 oz), SpO2 100 %.  PHYSICAL EXAMINATION:  GENERAL:  81 y.o.-year-old patient lying in the bed with no acute distress.  EYES: Pupils equal, round, reactive to light and accommodation. No scleral icterus. Extraocular muscles intact.  HEENT: Head atraumatic, normocephalic. Oropharynx and nasopharynx clear.  NECK:  Supple, no jugular venous distention. No thyroid enlargement, no tenderness.  LUNGS: Normal breath sounds bilaterally, no wheezing, fine  Rales at the bases. No use of accessory muscles of respiration.  CARDIOVASCULAR: S1, S2 normal. 3/6 systolic murmur present, no gallops  ABDOMEN: Soft, nontender, nondistended. Bowel sounds present. No organomegaly or mass.  EXTREMITIES: No pedal edema, cyanosis, or clubbing. Wounds with dressings noted on right shin and calf NEUROLOGIC: drowsy  PSYCHIATRIC: The patient drowsy SKIN: No obvious rash, lesion, or ulcer. Skin tear lesions on right shin and calf   LABORATORY PANEL:   CBC  Recent Labs Lab 04/07/15 0446  WBC 10.3  HGB 8.0*  HCT 25.5*  PLT 194    ------------------------------------------------------------------------------------------------------------------  Chemistries   Recent Labs Lab 04/01/15 0707  04/07/15 0446  NA 140  < > 141  K 3.9  < > 4.0  CL 100*  < > 100*  CO2 29  < > 30  GLUCOSE 371*  < > 224*  BUN 66*  < > 70*  CREATININE 1.62*  < > 1.89*  CALCIUM 8.2*  < > 8.7*  AST 18  --   --   ALT 15  --   --   ALKPHOS 98  --   --   BILITOT 0.6  --   --   < > = values in this interval not displayed. ------------------------------------------------------------------------------------------------------------------  Cardiac Enzymes  Recent Labs Lab 04/01/15 0707  TROPONINI 0.05*   ------------------------------------------------------------------------------------------------------------------  RADIOLOGY:  Ct Chest Wo Contrast  04/07/2015   CLINICAL DATA:  81 year old female with past medical history significant for congestive heart failure last known ejection fraction of 40%, diabetes mellitus, CK D with baseline creatinine of 1.8, recent and non-ST segment elevation MI, and left humerus fracture was discharged from rehabilitation to home yesterday presents to the hospital secondary to worsening respiratory distress.  EXAM: CT CHEST WITHOUT CONTRAST  TECHNIQUE: Multidetector CT imaging of the chest was performed following the standard protocol without IV contrast.  COMPARISON:  04/01/2015  FINDINGS: Thoracic inlet: No mass or adenopathy. Calcification in the right thyroid lobe.  Mediastinum and hila: Mild enlargement of the heart. Trace amount pericardial fluid. There are dense coronary artery calcifications. Mild enlargement of the pulmonary arteries, right measuring 27 mm in left measuring 28 mm. Aorta is normal in caliber. There is dense atherosclerotic calcification along the  thoracic aorta and the aortic arch branch vessels. No mediastinal or hilar masses or pathologically enlarged lymph nodes.  Lungs and pleura:  Mild to moderate changes of centrilobular emphysema. There is dependent opacity adjacent to the obliques fissures in both upper lobes most consistent with subsegmental atelectasis. There is a greater degree of atelectasis in the lower lobes adjacent to small bilateral pleural effusions. Bronchial wall thickening is noted in the right middle lobe, left upper lobe lingula and both lower lobes, which may be chronic. Consider bronchitis if there are consistent clinical symptoms. No evidence of lobar pneumonia. There is mild interstitial prominence most evident in the upper lobes. This is most likely chronic. A minor degree of interstitial edema is possible.  Limited upper abdomen: 12 mm left adrenal adenoma. Diffuse vascular calcifications. No acute finding.  Musculoskeletal: Minor depression of the upper endplate of T7. Mild compression fracture of T11. These fractures are likely chronic. Bones are demineralized. No osteoblastic or osteolytic lesions.  IMPRESSION: 1. There is cardiomegaly, small bilateral pleural effusions, mild upper lobe interstitial thickening and a trace amount of pericardial fluid. Mild congestive heart failure is suggested. 2. No evidence of pneumonia. 3. Emphysema and dependent bilateral lung atelectasis.   Electronically Signed   By: Amie Portland M.D.   On: 04/07/2015 11:10    EKG:   Orders placed or performed during the hospital encounter of 04/01/15  . EKG 12-Lead  . EKG 12-Lead  . EKG 12-Lead  . EKG 12-Lead    ASSESSMENT AND PLAN:   81 year old female with past medical history significant for congestive heart failure last known ejection fraction of 40%, diabetes mellitus, CK D with baseline creatinine of 1.8, recent and non-ST segment elevation MI, and left humerus fracture was discharged from rehabilitation to home yesterday presents to the hospital secondary to worsening respiratory distress.   #1 Acute respiratory distress- secondary to acute on chronic systolic CHF  exacerbation- Worst today change lasix to iv  Cardiology following. The echo was just done last admission in April 2016 and EF is 40-45%. on metoprolol low dose. not on any ACEI or ARB due to chronic kidney disease. CT non contrast chest   #2. Leukocytosis- no evidence of infection,  S/p tx with levaquin  . Monitor for now Finish 5 day course for possible bronchitis  #3. Anemia-  started patient on iron therapy. hgb stable  #4. DM- on lantus, glipizide and ssi  #5. COPD- on 2l oxygen at home, cont. On spiriva- continue  #6. CKD- stable, monitor while being diuresed  #7. DVT prophylaxis- lovenox  Pt re eval, daugher intrested in rehab however according to pt not rehab     All the records are reviewed and case discussed with Care Management/Social Workerr. Management plans discussed with the patient, family and they are in agreement.  CODE STATUS: DNR  TOTAL TIME TAKING CARE OF THIS PATIENT98min minutes.   D/w daughter recommend she talk to pallative care she agrees   Auburn Bilberry M.D on 04/07/2015 at 12:10 PM  Between 7am to 6pm - Pager - [phone]  After 6pm go to [email or web address] - password [email or web address] [facility]  [facility]  Office  [phone]  CC: Primary care physician; Marcine Matar, MD

## 2015-04-07 NOTE — Progress Notes (Signed)
Patient is minimally responsive to CM this day.    Her 02 requirements have risen.   Attempting to clarify physical therapy recommendation of skilled not being appropriate due to cognition.   Patient does have dementia but her  cognition today ( and apparently yesterday)  is far from her baseline.  Palliative care consult is in progress.   Unsure if decline is due to dementia or if is directly related to decline in cardiac status.  Palliative will speak with patient today

## 2015-04-08 LAB — BASIC METABOLIC PANEL
Anion gap: 11 (ref 5–15)
BUN: 62 mg/dL — ABNORMAL HIGH (ref 6–20)
CHLORIDE: 102 mmol/L (ref 101–111)
CO2: 31 mmol/L (ref 22–32)
CREATININE: 1.63 mg/dL — AB (ref 0.44–1.00)
Calcium: 8.7 mg/dL — ABNORMAL LOW (ref 8.9–10.3)
GFR calc non Af Amer: 27 mL/min — ABNORMAL LOW (ref >60)
GFR, EST AFRICAN AMERICAN: 32 mL/min — AB (ref >60)
GLUCOSE: 154 mg/dL — AB (ref 65–99)
Potassium: 3.5 mmol/L (ref 3.5–5.1)
Sodium: 144 mmol/L (ref 135–145)

## 2015-04-08 LAB — GLUCOSE, CAPILLARY
GLUCOSE-CAPILLARY: 165 mg/dL — AB (ref 70–99)
GLUCOSE-CAPILLARY: 106 mg/dL — AB (ref 70–99)
Glucose-Capillary: 159 mg/dL — ABNORMAL HIGH (ref 70–99)
Glucose-Capillary: 273 mg/dL — ABNORMAL HIGH (ref 70–99)

## 2015-04-08 NOTE — Care Management (Signed)
Spoke with [name]- patient's daughter.  She is adamant that patient will return home.  She will be moving into patient's home to provide round the clock care.  Patient will need a rolling walker.  She likes the idea of a home health agency that can transition to a hospice plan of care.  Agency preference is [facility].    Per referral intake, agency is in  network with patient's insurance.  Discussed possible need of hospital bed.  [name] is not sure.  Discussed that CM will obtain a script and give it to her at discharge and can decide if a hospital bed is really needed.  Discussed having home health staff make recommendations.  Services needed will be SN, PT, OT Aide and SW.  Discussed bringing patient's portable 02 tank for transport home.  There will be supportive family that will assist patient into her home

## 2015-04-08 NOTE — Plan of Care (Signed)
Problem: Consults Goal: Heart Failure Patient Education (See Patient Education module for education specifics.)  Outcome: Completed/Met Date Met:  04/08/15 Heart Failure booklet given to patient and daughter upon admission by RN, Riesa Pope.

## 2015-04-08 NOTE — Progress Notes (Signed)
Patient was lethargic this AM, per night shift did not sleep all night. Around lunch time patient became alert, disoriented to time, situation, and place. Sinus with PVC's on tele. On 2L of oxygen. No shortness of breath today. PT walked patient around the room with a walker and patient has been in the chair. Per the daughter, patient was only required to wear a sling for her humerus fracture until this past Friday. Sling was taken off last night and patient has tolerated it well, complaining of mild discomfort when moving. Palliative care and care management involved for home needs once patient is discharged. Will continue to monitor.

## 2015-04-08 NOTE — Progress Notes (Signed)
Patient: Laura Jefferson / Admit Date: 04/01/2015 / Date of Encounter: 04/08/2015, 8:55 AM   Subjective: In bed this AM, sleepy, minimal communication, nursing reporting she was up overnight, did not sleep well.   On 2L O2 via nasal cannula. Back on IV Lasix 40 mg bid 2/2 decline earlier.   Review of Systems: Review of Systems : based on yesterdays review Patient less verbal this AM, not answering lots of questions.  Constitutional: Positive for weight loss and malaise/fatigue. Negative for fever, chills and diaphoresis.  Respiratory: Positive for cough, shortness of breath and wheezing. Negative for hemoptysis and sputum production.   Cardiovascular: Negative for chest pain, palpitations, orthopnea, claudication, leg swelling and PND.  Gastrointestinal: Positive for nausea. Negative for vomiting and abdominal pain.  Neurological: Positive for weakness.    Objective: Telemetry: NSR, 70s, frequent ventricular bigeminy  Physical Exam: Blood pressure 141/66, pulse 64, temperature 98.7 F (37.1 C), temperature source Oral, resp. rate 20, height 5' (1.524 m), weight 47.673 kg (105 lb 1.6 oz), SpO2 94 %. Body mass index is 20.53 kg/(m^2). General: Frail appearing, in no acute distress, sleeping Head: Normocephalic, atraumatic, sclera non-icteric, no xanthomas, nares are without discharge. Neck: Negative for carotid bruits. JVP not elevated. Lungs: Bilateral crackles at the bases without wheezes or rhonchi. Breathing is unlabored on 2L O2 via [place]. Heart: RRR S1 S2 without murmurs, rubs, or gallops.  Abdomen: Soft, non-tender, non-distended with normoactive bowel sounds. No rebound/guarding. Extremities: No clubbing or cyanosis. No edema.  Neuro: less alert today Psych:  Less responsive to questions  No intake or output data in the 24 hours ending 04/08/15 0855  Inpatient Medications:  . antiseptic oral rinse  7 mL Mouth Rinse BID  . aspirin EC  81 mg Oral Daily  . enoxaparin (LOVENOX)  injection  30 mg Subcutaneous Q24H  . feeding supplement (PRO-STAT SUGAR FREE 64)  30 mL Oral BID BM  . ferrous sulfate  325 mg Oral TID WC  . folic acid  0.5 mg Oral Daily  . furosemide  40 mg Intravenous BID  . glipiZIDE  2.5 mg Oral Q breakfast  . GLUCERNA  237 mL Oral BID BM  . insulin aspart  0-20 Units Subcutaneous TID WC  . insulin aspart  0-5 Units Subcutaneous QHS  . insulin glargine  10 Units Subcutaneous Daily  . levofloxacin  250 mg Oral Q48H  . megestrol  400 mg Oral Daily  . metoprolol succinate  25 mg Oral Daily  . nicotine  7 mg Transdermal Daily  . PARoxetine  20 mg Oral Daily  . simvastatin  20 mg Oral Daily  . sodium chloride  3 mL Intravenous Q12H  . tiotropium  18 mcg Inhalation Daily  . vitamin B-12  1,000 mcg Oral Daily  . Vitamin D  2,000 Units Oral Daily   Infusions:    Labs:  Recent Labs  04/07/15 0446 04/08/15 0422  NA 141 144  K 4.0 3.5  CL 100* 102  CO2 30 31  GLUCOSE 224* 154*  BUN 70* 62*  CREATININE 1.89* 1.63*  CALCIUM 8.7* 8.7*   No results for input(s): AST, ALT, ALKPHOS, BILITOT, PROT, ALBUMIN in the last 72 hours.  Recent Labs  04/06/15 0456 04/07/15 0446  WBC 9.3 10.3  HGB 8.3* 8.0*  HCT 26.5* 25.5*  MCV 91.5 92.3  PLT 205 194   No results for input(s): CKTOTAL, CKMB, TROPONINI in the last 72 hours. Invalid input(s): POCBNP No results for  input(s): HGBA1C in the last 72 hours.   Weights: Filed Weights   04/06/15 0612 04/07/15 0514 04/08/15 0533  Weight: 49.261 kg (108 lb 9.6 oz) 49.533 kg (109 lb 3.2 oz) 47.673 kg (105 lb 1.6 oz)     Radiology/Studies:  Ct Chest Wo Contrast  04/07/2015   CLINICAL DATA:  81 year old female with past medical history significant for congestive heart failure last known ejection fraction of 40%, diabetes mellitus, CK D with baseline creatinine of 1.8, recent and non-ST segment elevation MI, and left humerus fracture was discharged from rehabilitation to home yesterday presents to the  hospital secondary to worsening respiratory distress.  EXAM: CT CHEST WITHOUT CONTRAST  TECHNIQUE: Multidetector CT imaging of the chest was performed following the standard protocol without IV contrast.  COMPARISON:  04/01/2015  FINDINGS: Thoracic inlet: No mass or adenopathy. Calcification in the right thyroid lobe.  Mediastinum and hila: Mild enlargement of the heart. Trace amount pericardial fluid. There are dense coronary artery calcifications. Mild enlargement of the pulmonary arteries, right measuring 27 mm in left measuring 28 mm. Aorta is normal in caliber. There is dense atherosclerotic calcification along the thoracic aorta and the aortic arch branch vessels. No mediastinal or hilar masses or pathologically enlarged lymph nodes.  Lungs and pleura: Mild to moderate changes of centrilobular emphysema. There is dependent opacity adjacent to the obliques fissures in both upper lobes most consistent with subsegmental atelectasis. There is a greater degree of atelectasis in the lower lobes adjacent to small bilateral pleural effusions. Bronchial wall thickening is noted in the right middle lobe, left upper lobe lingula and both lower lobes, which may be chronic. Consider bronchitis if there are consistent clinical symptoms. No evidence of lobar pneumonia. There is mild interstitial prominence most evident in the upper lobes. This is most likely chronic. A minor degree of interstitial edema is possible.  Limited upper abdomen: 12 mm left adrenal adenoma. Diffuse vascular calcifications. No acute finding.  Musculoskeletal: Minor depression of the upper endplate of T7. Mild compression fracture of T11. These fractures are likely chronic. Bones are demineralized. No osteoblastic or osteolytic lesions.    IMPRESSION: 1. There is cardiomegaly, small bilateral pleural effusions, mild upper lobe interstitial thickening and a trace amount of pericardial fluid. Mild congestive heart failure is suggested. 2. No evidence  of pneumonia. 3. Emphysema and dependent bilateral lung atelectasis.   Electronically Signed   By: Amie Portland M.D.   On: 04/07/2015 11:10      Assessment and Plan  81 y.o. female with past medical history significant for congestive heart failure last known ejection fraction of 40%, diabetes mellitus, CKD with baseline creatinine of 1.8, recent non-ST segment elevation MI, and left humerus fracture was presented to the hospital secondary to worsening respiratory distress.  1. Acute respiratory failure:  Suspect acute on chronic systolic CHF/flash pulmonary edema with underlying emphysema  upon admission with repeat flare  on 5/5 requiring extra dose of IV Lasix 40 mg and BiPAP.Currently weaned off BiPAP and on 2L O2 via nasal cannula. --Possible exacerbation secondary to underlying arrhythmia (PVCs in bigeminal pattern) ---Currently on lasix 40 IV BID --consider changing to lasix 40 po BID -Continue Toprol XL 25 daily.  -Recent echo 02/2015 with EF 40-45%, PASP 43 mm Hg, will not repeat at this time  2. Ventricular bigeminy/trigeminy:  -Continue Toprol XL. -Plan for outpatient 30 day event monitor at discharge if she or her daughter would like  --If difficulty maintaining respiratory status, may need to  add amiodarone for frequent PVCs  3. Elevated troponin:  -Mildly elevated at 0.05. Likely demand ischemia in the setting of #1 and CKD.  -Previous admission she was deemed to be not an invasive candidate given her dementia, advanced renal insufficiency and anemia.  -Continue medical management with aspirin and simvastatin.   4. Acute on CKD stage IV:  Creatinine at baseline -Avoid ACEi and ARBs given renal insufficiency  5. COPD:  -Home O2 -Off BiPAP currently -Continue O2 via nasal cannula   6. IDDM: -SSI per IM  7. Left arm fracture: -In the setting of increased weakness -Continue current treatment as advised by treating team  8. Dispo: -She appears too weak  for independent living as her daughter would like => needs evaluation for rehab stay.  -She is a DNR   Signed, Dossie Arbour, M.D., Ph.D.  04/08/2015 8:55 AM

## 2015-04-08 NOTE — Progress Notes (Signed)
Laura Jefferson               DOB:  [birth year] 81yo           Wt:  112     Ht:  5 1       BMI:    21.3 SS#- Need to obtain [facility] 251 Referral Source:  Ermalene Searing                        Referring MD:  Allena Katz Attending MD:  Loma Sender LP Criteria:  <12 months Ordered Disciplines:  SN, PT, OT, SW, HHA Discharge Date:  ? 5.11.16 F2F:  Needs DME:  patient has oxygen, rolling walker, will be given script for hospital bed (daughter unsure if she wants that now)  Hospital Summary:  New referral for [facility] post Palliative Medicine Consult on 81yo female who was admitted on 5.3.16 with Dx-CHF exacerbation.  Patient with past medical history of dementia, cardiomyopathy, anxiety, DM II, CKD, COPD (oxygen dependent on 2L/[place]), HLD and CAD (HX NSTEMI).  Daughter has recently moved in with her mother to provide care and plans on taking patient back home per [facility] note.  Ordered disciplines SN, PT, OT, SW and HHA.  Patient with tentative plans to discharge home tomorrow.  Updated information faxed to referral intake.  Late referral, so will complete referral tomorrow and discuss services with patients daughter.  Norris Cross, RN Clinical Nurse Liaison [facility] [phone]

## 2015-04-08 NOTE — Progress Notes (Signed)
[facility] - [place] at [facility]   PATIENT NAME: Laura Jefferson    MR#:  [id number]  DATE OF BIRTH:  1925-09-19  SUBJECTIVE:  CHIEF COMPLAINT:   Chief Complaint  Patient presents with  . Respiratory Distress    Pt breathing with some improvement, denies any complaints   REVIEW OF SYSTEMS:  CONSTITUTIONAL: No fever, fatigue or weakness.  RESPIRATORY: Positive for shortness of breath, No wheezing or hemoptysis.  CARDIOVASCULAR: No chest pain, orthopnea, edema.  GASTROINTESTINAL: No nausea, vomiting, diarrhea or abdominal pain.  GENITOURINARY: No dysuria, hematuria.  NEUROLOGIC: No tingling, numbness, weakness.  PSYCHIATRY: No anxiety or depression.   DRUG ALLERGIES:  No Known Allergies  VITALS:  Blood pressure 120/56, pulse 80, temperature 97.6 F (36.4 C), temperature source Oral, resp. rate 17, height 5' (1.524 m), weight 47.673 kg (105 lb 1.6 oz), SpO2 97 %.  PHYSICAL EXAMINATION:  GENERAL:  81 y.o.-year-old patient lying in the bed with no acute distress.  EYES: Pupils equal, round, reactive to light and accommodation. No scleral icterus. Extraocular muscles intact.  HEENT: Head atraumatic, normocephalic. Oropharynx and nasopharynx clear.  NECK:  Supple, no jugular venous distention. No thyroid enlargement, no tenderness.  LUNGS: Normal breath sounds bilaterally, no wheezing, fine  Rales at the bases. No use of accessory muscles of respiration.  CARDIOVASCULAR: S1, S2 normal. 3/6 systolic murmur present, no gallops  ABDOMEN: Soft, nontender, nondistended. Bowel sounds present. No organomegaly or mass.  EXTREMITIES: No pedal edema, cyanosis, or clubbing. Wounds with dressings noted on right shin and calf NEUROLOGIC: drowsy  PSYCHIATRIC: The patient drowsy SKIN: No obvious rash, lesion, or ulcer. Skin tear lesions on right shin and calf   LABORATORY PANEL:   CBC  Recent Labs Lab 04/07/15 0446  WBC 10.3  HGB 8.0*  HCT 25.5*  PLT 194    ------------------------------------------------------------------------------------------------------------------  Chemistries   Recent Labs Lab 04/08/15 0422  NA 144  K 3.5  CL 102  CO2 31  GLUCOSE 154*  BUN 62*  CREATININE 1.63*  CALCIUM 8.7*   ------------------------------------------------------------------------------------------------------------------  Cardiac Enzymes No results for input(s): TROPONINI in the last 168 hours. ------------------------------------------------------------------------------------------------------------------  RADIOLOGY:  Ct Chest Wo Contrast  04/07/2015   CLINICAL DATA:  81 year old female with past medical history significant for congestive heart failure last known ejection fraction of 40%, diabetes mellitus, CK D with baseline creatinine of 1.8, recent and non-ST segment elevation MI, and left humerus fracture was discharged from rehabilitation to home yesterday presents to the hospital secondary to worsening respiratory distress.  EXAM: CT CHEST WITHOUT CONTRAST  TECHNIQUE: Multidetector CT imaging of the chest was performed following the standard protocol without IV contrast.  COMPARISON:  04/01/2015  FINDINGS: Thoracic inlet: No mass or adenopathy. Calcification in the right thyroid lobe.  Mediastinum and hila: Mild enlargement of the heart. Trace amount pericardial fluid. There are dense coronary artery calcifications. Mild enlargement of the pulmonary arteries, right measuring 27 mm in left measuring 28 mm. Aorta is normal in caliber. There is dense atherosclerotic calcification along the thoracic aorta and the aortic arch branch vessels. No mediastinal or hilar masses or pathologically enlarged lymph nodes.  Lungs and pleura: Mild to moderate changes of centrilobular emphysema. There is dependent opacity adjacent to the obliques fissures in both upper lobes most consistent with subsegmental atelectasis. There is a greater degree of atelectasis  in the lower lobes adjacent to small bilateral pleural effusions. Bronchial wall thickening is noted in the right middle lobe, left upper lobe  lingula and both lower lobes, which may be chronic. Consider bronchitis if there are consistent clinical symptoms. No evidence of lobar pneumonia. There is mild interstitial prominence most evident in the upper lobes. This is most likely chronic. A minor degree of interstitial edema is possible.  Limited upper abdomen: 12 mm left adrenal adenoma. Diffuse vascular calcifications. No acute finding.  Musculoskeletal: Minor depression of the upper endplate of T7. Mild compression fracture of T11. These fractures are likely chronic. Bones are demineralized. No osteoblastic or osteolytic lesions.  IMPRESSION: 1. There is cardiomegaly, small bilateral pleural effusions, mild upper lobe interstitial thickening and a trace amount of pericardial fluid. Mild congestive heart failure is suggested. 2. No evidence of pneumonia. 3. Emphysema and dependent bilateral lung atelectasis.   Electronically Signed   By: Amie Portland M.D.   On: 04/07/2015 11:10    EKG:   Orders placed or performed during the hospital encounter of 04/01/15  . EKG 12-Lead  . EKG 12-Lead  . EKG 12-Lead  . EKG 12-Lead    ASSESSMENT AND PLAN:   81 year old female with past medical history significant for congestive heart failure last known ejection fraction of 40%, diabetes mellitus, CK D with baseline creatinine of 1.8, recent and non-ST segment elevation MI, and left humerus fracture was discharged from rehabilitation to home yesterday presents to the hospital secondary to worsening respiratory distress.   #1 Acute respiratory distress- secondary to acute on chronic systolic CHF exacerbation- Worst today change lasix to iv  Cardiology following. The echo was just done last admission in April 2016 and EF is 40-45%. on metoprolol low dose. not on any ACEI or ARB due to chronic kidney disease. CT non  contrast chest   #2. Leukocytosis- no evidence of infection,  S/p tx with levaquin  . Monitor for now Finish 5 day course for possible bronchitis  #3. Anemia-  started patient on iron therapy. hgb stable  #4. DM- on lantus, glipizide and ssi  #5. COPD- on 2l oxygen at home, cont. On spiriva- continue  #6. CKD- stable, monitor while being diuresed  #7. DVT prophylaxis- lovenox       All the records are reviewed and case discussed with Care Management/Social Workerr. Management plans discussed with the patient, family and they are in agreement.  CODE STATUS: DNR  TOTAL TIME TAKING CARE OF THIS PATIENT42min minutes.   D/w daughter recommend she talk to pallative care she agrees   Auburn Bilberry M.D on 04/08/2015 at 2:55 PM  Between 7am to 6pm - Pager - [phone]  After 6pm go to [email or web address] - password [email or web address] [facility]  [facility]  Office  [phone]  CC: Primary care physician; Marcine Matar, MD

## 2015-04-08 NOTE — Consult Note (Signed)
Daughter present for goals of care conversation.  Updated daughter on pt working well with PT and has currently eaten 50% of lunch tray.  Daughter states that she would like to take pt home as she has moved in with her to be her caregiver.  Spoke with daughter about different services that could be provided in the home.  Spoke at length about home health services attached to a hospice agency and daughter states she would like as pt's health is declining and could be hospice appropriate in the near future.  Spoke with CM and she will present agencies.

## 2015-04-08 NOTE — Progress Notes (Signed)
Physical Therapy Treatment Patient Details Name: Laura Jefferson MRN: [id number] DOB: 13-Jul-1925 Today's Date: 04/08/2015    History of Present Illness 81 yo female with onset of SOB after recent SNF discharge on home O2.  Recent L humeral fracture and NWB, demand ischemia with elevated troponin, acute respiratory failure.  PMHx:  CKD, PAF, NSTEMI, EF 40-45%, anxiety, PVD, dementia, COPD, DM, CHF    PT Comments    Pt requires encouragement, but participates well. Pt able to use rolling walker with very little lean through bilateral upper extremities. Will address with primary PT. Pt up in chair, then requests to use commode. Able to ambulate to bedside commode several feel and perform personal hygiene with set up. Progress ambulation distance for safe discharge home.   Follow Up Recommendations  Home health PT;Supervision/Assistance - 24 hour     Equipment Recommendations       Recommendations for Other Services       Precautions / Restrictions Precautions Precautions: Fall Restrictions Weight Bearing Restrictions: No Other Position/Activity Restrictions: As per nsg; pt out of sling may weightbear through LUE    Mobility  Bed Mobility Overal bed mobility: Needs Assistance Bed Mobility: Supine to Sit     Supine to sit: Min assist     General bed mobility comments: increased time  Transfers Overall transfer level: Needs assistance Equipment used: Rolling walker (2 wheeled) Transfers: Sit to/from Stand Sit to Stand: Min guard            Ambulation/Gait Ambulation/Gait assistance: Min guard Ambulation Distance (Feet): 5 Feet Assistive device: Rolling walker (2 wheeled) Gait Pattern/deviations: Decreased step length - right;Decreased step length - left (slow, no LOB able to use rw with little pressure through BUE) Gait velocity: reduced   General Gait Details: baseline dementia but also may have UTI, due to odor in urine.  Spoke with CNA and nursing busy, to  convey   Stairs            Wheelchair Mobility    Modified Rankin (Stroke Patients Only)       Balance Overall balance assessment: Modified Independent Sitting-balance support: No upper extremity supported;Feet supported Sitting balance-Leahy Scale: Good     Standing balance support: Single extremity supported;Bilateral upper extremity supported Standing balance-Leahy Scale: Fair                      Cognition Arousal/Alertness: Awake/alert Behavior During Therapy: Flat affect Overall Cognitive Status: History of cognitive impairments - at baseline                      Exercises General Exercises - Lower Extremity Ankle Circles/Pumps: AROM;Both;20 reps;Supine Quad Sets: Strengthening;Both;20 reps;Supine Gluteal Sets: Strengthening;Both;20 reps;Supine Short Arc Quad: AROM;Both;20 reps;Supine Long Arc Quad: AROM;Both;20 reps;Seated Heel Slides: AROM;Both;20 reps;Supine Hip ABduction/ADduction: AROM;Both;20 reps;Supine Straight Leg Raises: AROM;Both;10 reps;Supine Hip Flexion/Marching: AROM;Both;20 reps;Seated    General Comments        Pertinent Vitals/Pain Pain Location:  [place] L arm and leg Pain Intervention(s): Limited activity within patient's tolerance    Home Living                      Prior Function            PT Goals (current goals can now be found in the care plan section) Progress towards PT goals: Progressing toward goals    Frequency  Min 2X/week    PT Plan Current plan remains appropriate  Co-evaluation             End of Session Equipment Utilized During Treatment: Gait belt;Oxygen Activity Tolerance: Patient tolerated treatment well;No increased pain (required increased encouragement for participation/out bed) Patient left: with nursing/sitter in room (bedside commode)     Time: 5809-9833 PT Time Calculation (min) (ACUTE ONLY): 38 min  Charges:  $Gait Training: 8-22 mins $Therapeutic  Exercise: 8-22 mins $Therapeutic Activity: 8-22 mins                    G Codes:      Kristeen Miss 04/08/2015, 3:14 PM

## 2015-04-08 NOTE — Care Management (Signed)
CM to meet with patient's daughter this day to discuss disposition.  Physicians have documented daughter is wishing to pursue skilled nursing.  Physical therapy has continued to document recommendation of home with home health.  Patient is going to need 24hr supervision.  Palliative care is also going to speak with daughter today.

## 2015-04-08 NOTE — Consult Note (Signed)
Palliative Medicine Inpatient Consult Follow Up Note   Name: Laura Jefferson Date: 04/08/2015 MRN: [id number]  DOB: 1925/08/18  Referring Physician: Auburn Bilberry, MD  Palliative Care consult requested for this 81 y.o. female for goals of medical therapy in patient with dementia, mixed CM, anxiety, T2DM, CKD, COPD (2L [place] cont), HLD, h/o NSTEMI, and L humerus fracture . Pt presented to ER on 04-01-2015 with respiratory distress. ER workup significant for leukocytosis and CHF exacerbation. Pt admitted for evaluation and treatment. Family not at beside.  Pt currently sitting in chair and eating lunch.  Pt in NAD after working with PT.Marland Kitchen    REVIEW OF SYSTEMS:  Pain: None Dyspnea:  No All other systems were reviewed and found to be negative  CODE STATUS: DNR   PAST MEDICAL HISTORY: Past Medical History  Diagnosis Date  . Muscle weakness   . Non-ST elevated myocardial infarction     a. likely demand ischemia 02/2015 in the setting of ARF and acute on chronic CHF  . Humerus fracture     a. left, b. 02/2015; c. in the setting of increased weakness  . Dysphagia   . Anemia   . Paroxysmal a-fib     a. reported a-fib, no documented EKGs showing this  . Chronic systolic CHF (congestive heart failure)     a. echo 02/2015: EF 40-45%, mod dilated LA, mildly dilated RA, mod MR/TR, mildly elevated PASP 43 mm Hg, no evidence of intracardiac shunting, intact interatrial and interventricular septa  . COPD (chronic obstructive pulmonary disease)     a. on home O2  . Hypertensive chronic kidney disease   . Diabetes mellitus without complication   . PVD (peripheral vascular disease)   . Dementia   . Anxiety disorder     PAST SURGICAL HISTORY:  Past Surgical History  Procedure Laterality Date  . Arm surgery    . Peripheral arterial stent graft      Vital Signs: BP 120/56 mmHg  Pulse 80  Temp(Src) 97.6 F (36.4 C) (Oral)  Resp 17  Ht 5' (1.524 m)  Wt 47.673 kg (105 lb 1.6 oz)  BMI 20.53  kg/m2  SpO2 97% Filed Weights   04/06/15 0612 04/07/15 0514 04/08/15 0533  Weight: 49.261 kg (108 lb 9.6 oz) 49.533 kg (109 lb 3.2 oz) 47.673 kg (105 lb 1.6 oz)    Estimated body mass index is 20.53 kg/(m^2) as calculated from the following:   Height as of this encounter: 5' (1.524 m).   Weight as of this encounter: 47.673 kg (105 lb 1.6 oz).  PHYSICAL EXAM: General appearance: alert Head: Normocephalic, without obvious abnormality, atraumatic Neck: supple, symmetrical, trachea midline and thyroid not enlarged, symmetric, no tenderness/mass/nodules Resp: clear to auscultation bilaterally GI: soft, non-tender; bowel sounds normal; no masses,  no organomegaly Extremities: extremities normal, atraumatic, no cyanosis or edema Neurologic: Grossly normal  LABS: CBC:    Component Value Date/Time   WBC 10.3 04/07/2015 0446   WBC 10.2 03/11/2015 0749   HGB 8.0* 04/07/2015 0446   HGB 8.2* 03/11/2015 0749   HCT 25.5* 04/07/2015 0446   HCT 26.4* 03/11/2015 0749   PLT 194 04/07/2015 0446   PLT 244 03/11/2015 0749   MCV 92.3 04/07/2015 0446   MCV 93 03/11/2015 0749   NEUTROABS 7.4* 03/11/2015 0749   LYMPHSABS 1.6 03/11/2015 0749   MONOABS 0.8 03/11/2015 0749   EOSABS 0.3 03/11/2015 0749   BASOSABS 0.0 03/11/2015 0749   Comprehensive Metabolic Panel:    Component Value  Date/Time   NA 144 04/08/2015 0422   NA 143 03/11/2015 0749   K 3.5 04/08/2015 0422   K 3.9 03/11/2015 0749   CL 102 04/08/2015 0422   CL 109 03/11/2015 0749   CO2 31 04/08/2015 0422   CO2 27 03/11/2015 0749   BUN 62* 04/08/2015 0422   BUN 47* 03/11/2015 0749   CREATININE 1.63* 04/08/2015 0422   CREATININE 1.48* 03/11/2015 0749   GLUCOSE 154* 04/08/2015 0422   GLUCOSE 209* 03/11/2015 0749   CALCIUM 8.7* 04/08/2015 0422   CALCIUM 8.0* 03/11/2015 0749   AST 18 04/01/2015 0707   AST 18 03/07/2015 1730   ALT 15 04/01/2015 0707   ALT 12* 03/07/2015 1730   ALKPHOS 98 04/01/2015 0707   ALKPHOS 68 03/07/2015  1730   BILITOT 0.6 04/01/2015 0707   PROT 7.0 04/01/2015 0707   PROT 6.1* 03/07/2015 1730   ALBUMIN 3.0* 04/01/2015 0707   ALBUMIN 2.5* 03/07/2015 1730    IMPRESSION: dementia, mixed CM, anxiety, T2DM, CKD, COPD (2L [place] cont), HLD, h/o NSTEMI, and L humerus fracture . Pt presented to ER on 04-01-2015 with respiratory distress. ER workup significant for leukocytosis and CHF exacerbation. Pt admitted for evaluation and treatment. Family not at beside.  Pt resting in chair in NAD.  Pt was able to work with PT today.  Spoke with daughter by phone who is on the way to hospital.  Will attempt to meet with her today when she arrives to discuss goals of care.  PLAN: 1. Goals of care meeting     More than 50% of the visit was spent in counseling/coordination of care: YES  Time Spent: 25 minutes

## 2015-04-09 DIAGNOSIS — I5042 Chronic combined systolic (congestive) and diastolic (congestive) heart failure: Secondary | ICD-10-CM

## 2015-04-09 DIAGNOSIS — I480 Paroxysmal atrial fibrillation: Secondary | ICD-10-CM

## 2015-04-09 DIAGNOSIS — I472 Ventricular tachycardia: Secondary | ICD-10-CM

## 2015-04-09 DIAGNOSIS — I248 Other forms of acute ischemic heart disease: Secondary | ICD-10-CM

## 2015-04-09 LAB — BASIC METABOLIC PANEL
Anion gap: 11 (ref 5–15)
BUN: 65 mg/dL — ABNORMAL HIGH (ref 6–20)
CALCIUM: 8.5 mg/dL — AB (ref 8.9–10.3)
CO2: 30 mmol/L (ref 22–32)
CREATININE: 1.7 mg/dL — AB (ref 0.44–1.00)
Chloride: 96 mmol/L — ABNORMAL LOW (ref 101–111)
GFR calc Af Amer: 30 mL/min — ABNORMAL LOW (ref >60)
GFR, EST NON AFRICAN AMERICAN: 26 mL/min — AB (ref >60)
GLUCOSE: 192 mg/dL — AB (ref 65–99)
Potassium: 3.6 mmol/L (ref 3.5–5.1)
SODIUM: 137 mmol/L (ref 135–145)

## 2015-04-09 LAB — MAGNESIUM: MAGNESIUM: 2.1 mg/dL (ref 1.7–2.4)

## 2015-04-09 LAB — CBC
HEMATOCRIT: 23.7 % — AB (ref 35.0–47.0)
Hemoglobin: 7.6 g/dL — ABNORMAL LOW (ref 12.0–16.0)
MCH: 29.5 pg (ref 26.0–34.0)
MCHC: 32.2 g/dL (ref 32.0–36.0)
MCV: 91.7 fL (ref 80.0–100.0)
Platelets: 181 10*3/uL (ref 150–440)
RBC: 2.59 MIL/uL — ABNORMAL LOW (ref 3.80–5.20)
RDW: 15.7 % — AB (ref 11.5–14.5)
WBC: 8.6 10*3/uL (ref 3.6–11.0)

## 2015-04-09 LAB — GLUCOSE, CAPILLARY
Glucose-Capillary: 222 mg/dL — ABNORMAL HIGH (ref 70–99)
Glucose-Capillary: 243 mg/dL — ABNORMAL HIGH (ref 70–99)
Glucose-Capillary: 152 mg/dL — ABNORMAL HIGH (ref 70–99)
Glucose-Capillary: 162 mg/dL — ABNORMAL HIGH (ref 70–99)

## 2015-04-09 MED ORDER — FUROSEMIDE 40 MG PO TABS
40.0000 mg | ORAL_TABLET | Freq: Two times a day (BID) | ORAL | Status: DC
  Administered 2015-04-09 – 2015-04-10 (×3): 40 mg via ORAL
  Filled 2015-04-09 (×3): qty 1

## 2015-04-09 MED ORDER — FUROSEMIDE 10 MG/ML IJ SOLN: 40.0000 mg | Freq: Two times a day (BID) | INTRAMUSCULAR | Status: DC

## 2015-04-09 MED ORDER — INSULIN GLARGINE 100 UNIT/ML ~~LOC~~ SOLN
14.0000 [IU] | Freq: Every day | SUBCUTANEOUS | Status: DC
  Administered 2015-04-10 – 2015-04-11 (×2): 14 [IU] via SUBCUTANEOUS
  Filled 2015-04-09 (×3): qty 0.14

## 2015-04-09 MED ORDER — DARBEPOETIN ALFA 40 MCG/0.4ML IJ SOSY: 40.0000 ug | PREFILLED_SYRINGE | Freq: Once | INTRAMUSCULAR | Status: DC

## 2015-04-09 MED ORDER — EPOETIN ALFA 10000 UNIT/ML IJ SOLN
10000.0000 [IU] | Freq: Once | INTRAMUSCULAR | Status: AC
  Administered 2015-04-09: 10000 [IU] via SUBCUTANEOUS
  Filled 2015-04-09: qty 1

## 2015-04-09 NOTE — Progress Notes (Signed)
Pt alert to self only. Impulsive at times.  Pt awake throughout night. VSS, no complaints of pain. SR per monitor. Currently on 2 L via [place]. No further complaints. Continue to monitor.

## 2015-04-09 NOTE — Progress Notes (Signed)
[facility] [place] at [facility]   PATIENT NAME: Laura Jefferson    MR#:  [id number]  DATE OF BIRTH:  [birth year]  SUBJECTIVE:  CHIEF COMPLAINT:   Chief Complaint  Patient presents with  . Respiratory Distress    patient's breathing is about the same, she feels weak. Position is currently pending   REVIEW OF SYSTEMS:  CONSTITUTIONAL: No fever, fatigue or weakness.  RESPIRATORY: Positive for shortness of breath, No wheezing or hemoptysis.  CARDIOVASCULAR: No chest pain, orthopnea, edema.  GASTROINTESTINAL: No nausea, vomiting, diarrhea or abdominal pain.  GENITOURINARY: No dysuria, hematuria.  NEUROLOGIC: No tingling, numbness, weakness.  PSYCHIATRY: No anxiety or depression.   DRUG ALLERGIES:  No Known Allergies  VITALS:  Blood pressure 144/47, pulse 69, temperature 98.4 F (36.9 C), temperature source Oral, resp. rate 19, height 5' (1.524 m), weight 47.219 kg (104 lb 1.6 oz), SpO2 98 %.  PHYSICAL EXAMINATION:  GENERAL:  81 y.o.-year-old patient lying in the bed with no acute distress.  EYES: Pupils equal, round, reactive to light and accommodation. No scleral icterus. Extraocular muscles intact.  HEENT: Head atraumatic, normocephalic. Oropharynx and nasopharynx clear.  NECK:  Supple, no jugular venous distention. No thyroid enlargement, no tenderness.  LUNGS: Normal breath sounds bilaterally, no wheezing,  No use of accessory muscles of respiration.  CARDIOVASCULAR: S1, S2 normal. 3/6 systolic murmur present, no gallops  ABDOMEN: Soft, nontender, nondistended. Bowel sounds present. No organomegaly or mass.  EXTREMITIES: No pedal edema, cyanosis, or clubbing. Wounds with dressings noted on right shin and calf NEUROLOGIC: drowsy  PSYCHIATRIC: The patient drowsy SKIN: No obvious rash, lesion, or ulcer. Skin tear lesions on right shin and calf   LABORATORY PANEL:   CBC  Recent Labs Lab 04/09/15 0434  WBC 8.6  HGB 7.6*  HCT 23.7*  PLT 181    ------------------------------------------------------------------------------------------------------------------  Chemistries   Recent Labs Lab 04/09/15 0434  NA 137  K 3.6  CL 96*  CO2 30  GLUCOSE 192*  BUN 65*  CREATININE 1.70*  CALCIUM 8.5*  MG 2.1   ------------------------------------------------------------------------------------------------------------------  Cardiac Enzymes No results for input(s): TROPONINI in the last 168 hours. ------------------------------------------------------------------------------------------------------------------  RADIOLOGY:  No results found.  EKG:   Orders placed or performed during the hospital encounter of 04/01/15  . EKG 12-Lead  . EKG 12-Lead  . EKG 12-Lead  . EKG 12-Lead    ASSESSMENT AND PLAN:   81 year old female with past medical history significant for congestive heart failure last known ejection fraction of 40%, diabetes mellitus, CK D with baseline creatinine of 1.8, recent and non-ST segment elevation MI, and left humerus fracture was discharged from rehabilitation to home yesterday presents to the hospital secondary to worsening respiratory distress.   #1 Acute respiratory distress- secondary to acute on chronic systolic CHF exacerbation- Change back to oral Lasix 40 twice a day Cardiology following. The echo was just done last admission in April 2016 and EF is 40-45%. on metoprolol low dose. not on any ACEI or ARB due to chronic kidney disease. CT non contrast chest shows congestive heart failure without evidence of pneumonia.  #2. Leukocytosis- no evidence of infection,  S/p tx with levaquin  . Monitor for now Finish 5 day course for possible bronchitis  #3. Anemia-  started patient on iron therapy. Hemoglobin is labile again decreased. I will give her a dose of Epogen.  #4. DM- on lantus, glipizide and ssi will increase the dose of Lantus  #5. COPD- on 2l oxygen  at home, cont. On spiriva-  continue  #6. CKD- stable, monitor while being diuresed  #7. DVT prophylaxis- lovenox       All the records are reviewed and case discussed with Care Management/Social Workerr. Management plans discussed with the patient, family and they are in agreement.  CODE STATUS: DNR  TOTAL TIME TAKING CARE OF THIS PATIENT68min minutes.   D/w daughter recommend she talk to pallative care she agrees   Auburn Bilberry M.D on 04/09/2015 at 2:13 PM  Between 7am to 6pm - Pager - [phone]  After 6pm go to [email or web address] - password [email or web address] [facility]  [place] [facility]  Office  [phone]  CC: Primary care physician; Marcine Matar, MD

## 2015-04-09 NOTE — Progress Notes (Signed)
Physical Therapy Treatment Patient Details Name: Laura Jefferson MRN: [id number] DOB: 1925-03-23 Today's Date: 04/09/2015    History of Present Illness 81 yo female with onset of SOB after recent SNF discharge on home O2.  Recent L humeral fracture and NWB, demand ischemia with elevated troponin, acute respiratory failure.  PMHx:  CKD, PAF, NSTEMI, EF 40-45%, anxiety, PVD, dementia, COPD, DM, CHF    PT Comments    Pt requires encouragement to participate with PT and out of bed activities today. Progressing ambulation distance. Pt fatigued post ambulation and did not wish further therapy. Comfortable up in chair. Continue progression toward stated goals.  Follow Up Recommendations  Home health PT;Supervision/Assistance - 24 hour     Equipment Recommendations       Recommendations for Other Services       Precautions / Restrictions Precautions Precautions: Fall Precaution Comments: telemetry Restrictions Weight Bearing Restrictions: No    Mobility  Bed Mobility Overal bed mobility: Needs Assistance Bed Mobility: Supine to Sit     Supine to sit: Min assist        Transfers Overall transfer level: Modified independent Equipment used: Rolling walker (2 wheeled) Transfers: Sit to/from Stand Sit to Stand: Min guard            Ambulation/Gait Ambulation/Gait assistance: Min guard Ambulation Distance (Feet): 70 Feet Assistive device: Rolling walker (2 wheeled) Gait Pattern/deviations: WFL(Within Functional Limits) Gait velocity: reduced Gait velocity interpretation: <1.8 ft/sec, indicative of risk for recurrent falls     Stairs            Wheelchair Mobility    Modified Rankin (Stroke Patients Only)       Balance             Standing balance-Leahy Scale: Fair                      Cognition Arousal/Alertness: Awake/alert Behavior During Therapy: WFL for tasks assessed/performed Overall Cognitive Status: History of cognitive  impairments - at baseline                      Exercises      General Comments        Pertinent Vitals/Pain Pain Assessment: No/denies pain    Home Living                      Prior Function            PT Goals (current goals can now be found in the care plan section) Progress towards PT goals: Progressing toward goals    Frequency  Min 2X/week    PT Plan Current plan remains appropriate    Co-evaluation             End of Session Equipment Utilized During Treatment: Gait belt;Oxygen Activity Tolerance: Patient limited by fatigue Patient left: in chair;with call bell/phone within reach;with chair alarm set;with nursing/sitter in room     Time: 1112-1132 PT Time Calculation (min) (ACUTE ONLY): 20 min  Charges:  $Gait Training: 8-22 mins                    G Codes:      Kristeen Miss 04/09/2015, 11:48 AM

## 2015-04-09 NOTE — Progress Notes (Signed)
Daughter request labs be drawn prior to discharge to ensure to infection remains. Daughter would also like to be called if not present for attending rounds. Phone number indicated below. Will indicate this on physician team sticky notes as well and pass on to night shift.   Joycelyn Man

## 2015-04-09 NOTE — Progress Notes (Addendum)
Inpatient Diabetes Program Recommendations  AACE/ADA: New Consensus Statement on Inpatient Glycemic Control (2013)  Target Ranges:  Prepandial:   less than 140 mg/dL      Peak postprandial:   less than 180 mg/dL (1-2 hours)      Critically ill patients:  140 - 180 mg/dL   Results for VANECIA, GRIME (MRN [id number]) as of 04/09/2015 09:31  Ref. Range 04/08/2015 07:41 04/08/2015 11:42 04/08/2015 16:29 04/08/2015 21:43 04/09/2015 07:22  Glucose-Capillary Latest Ref Range: 70-99 mg/dL 326 (H) 712 (H) 458 (H) 273 (H) 222 (H)   Diabetes history: DM2 Outpatient Diabetes medications: Glipizide XL 2.5 mg QAM, Novolog 0-12 units TID with meals Current orders for Inpatient glycemic control: Glipizide XL 2.5 mg QAM, Lantus 10 units daily, Novolog 0-20 units TID with meals, Novolog 0-5 units HS  Inpatient Diabetes Program Recommendations Insulin - Basal: Please consider increasing Lantus to 12 units daily. Thanks, Orlando Penner, RN, MSN, CCRN, CDE Diabetes Coordinator Inpatient Diabetes Program [phone] (Team Pager from 8am to 5pm) [phone] ([facility] office) [phone] [facility] office)

## 2015-04-09 NOTE — Progress Notes (Signed)
Notified physician that no PO lasix order is in place. Given verbal order for PO furosemide 40mg  BID.

## 2015-04-09 NOTE — Care Management (Signed)
Patient's daughter Briant Cedar has reconsidered hospital bed and would like to move forward with referral.  If at all possible, would like an estimate of what a monthly rental would be for hospital bed.  Christoper Allegra stated that would not give a quote  without checking insurance and even if someone was going to pay privately - not allowed to give out a "cash price."    Faxed the referral for front wheeled rolling walker (that had been ordered on a previous occasion and referral canceled due to readmit).  Documented need to speak with daughter- not patient regarding financial agreement.  [facility] has accepted referral

## 2015-04-09 NOTE — Progress Notes (Signed)
Notified physician that IV lasix is still an active order and the plan, according to report given at 3pm and MD note, was to switch lasix to PO. No PO lasix currently ordered.

## 2015-04-09 NOTE — Progress Notes (Signed)
Patient: Laura Jefferson / Admit Date: 04/01/2015 / Date of Encounter: 04/09/2015, 8:54 AM   Subjective: In bed this AM, feels breathing is about the same, possibly a little better. On 2L via [place]. Continues on IV Lasix 40 mg bid.   Review of Systems: Review of Systems  Constitutional: Positive for weight loss and malaise/fatigue. Negative for fever, chills and diaphoresis.  Respiratory: Positive for cough and shortness of breath. Negative for hemoptysis, sputum production and wheezing.   Cardiovascular: Negative for chest pain, palpitations, orthopnea, claudication, leg swelling and PND.  Gastrointestinal: Negative for nausea and vomiting.  Neurological: Positive for focal weakness and weakness.       Objective: Telemetry: NSR, 70s, frequent ventricular bigeminy  Physical Exam: Blood pressure 135/53, pulse 75, temperature 97.6 F (36.4 C), temperature source Oral, resp. rate 22, height 5' (1.524 m), weight 104 lb 1.6 oz (47.219 kg), SpO2 92 %. Body mass index is 20.33 kg/(m^2). General: Frail appearing, in no acute distress, sleeping Head: Normocephalic, atraumatic, sclera non-icteric, no xanthomas, nares are without discharge. Neck: Negative for carotid bruits. JVP not elevated. Lungs: Bilateral crackles at the bases without wheezes or rhonchi. Breathing is unlabored on 2L O2 via [place]. Heart: RRR S1 S2 without murmurs, rubs, or gallops.  Abdomen: Soft, non-tender, non-distended with normoactive bowel sounds. No rebound/guarding. Extremities: No clubbing or cyanosis. No edema.  Neuro: less alert today Psych:  Less responsive to questions   Intake/Output Summary (Last 24 hours) at 04/09/15 0854 Last data filed at 04/08/15 2200  Gross per 24 hour  Intake    460 ml  Output    275 ml  Net    185 ml    Inpatient Medications:  . antiseptic oral rinse  7 mL Mouth Rinse BID  . aspirin EC  81 mg Oral Daily  . enoxaparin (LOVENOX) injection  30 mg Subcutaneous Q24H  . feeding  supplement (PRO-STAT SUGAR FREE 64)  30 mL Oral BID BM  . ferrous sulfate  325 mg Oral TID WC  . folic acid  0.5 mg Oral Daily  . furosemide  40 mg Intravenous BID  . glipiZIDE  2.5 mg Oral Q breakfast  . GLUCERNA  237 mL Oral BID BM  . insulin aspart  0-20 Units Subcutaneous TID WC  . insulin aspart  0-5 Units Subcutaneous QHS  . insulin glargine  10 Units Subcutaneous Daily  . levofloxacin  250 mg Oral Q48H  . megestrol  400 mg Oral Daily  . metoprolol succinate  25 mg Oral Daily  . nicotine  7 mg Transdermal Daily  . PARoxetine  20 mg Oral Daily  . simvastatin  20 mg Oral Daily  . sodium chloride  3 mL Intravenous Q12H  . tiotropium  18 mcg Inhalation Daily  . vitamin B-12  1,000 mcg Oral Daily  . Vitamin D  2,000 Units Oral Daily   Infusions:    Labs:  Recent Labs  04/08/15 0422 04/09/15 0434  NA 144 137  K 3.5 3.6  CL 102 96*  CO2 31 30  GLUCOSE 154* 192*  BUN 62* 65*  CREATININE 1.63* 1.70*  CALCIUM 8.7* 8.5*  MG  --  2.1   No results for input(s): AST, ALT, ALKPHOS, BILITOT, PROT, ALBUMIN in the last 72 hours.  Recent Labs  04/07/15 0446 04/09/15 0434  WBC 10.3 8.6  HGB 8.0* 7.6*  HCT 25.5* 23.7*  MCV 92.3 91.7  PLT 194 181   No results for input(s):  CKTOTAL, CKMB, TROPONINI in the last 72 hours. Invalid input(s): POCBNP No results for input(s): HGBA1C in the last 72 hours.   Weights: Filed Weights   04/07/15 0514 04/08/15 0533 04/08/15 2201  Weight: 109 lb 3.2 oz (49.533 kg) 105 lb 1.6 oz (47.673 kg) 104 lb 1.6 oz (47.219 kg)     Radiology/Studies:  Ct Chest Wo Contrast  04/07/2015   CLINICAL DATA:  81 year old female with past medical history significant for congestive heart failure last known ejection fraction of 40%, diabetes mellitus, CK D with baseline creatinine of 1.8, recent and non-ST segment elevation MI, and left humerus fracture was discharged from rehabilitation to home yesterday presents to the hospital secondary to worsening  respiratory distress.  EXAM: CT CHEST WITHOUT CONTRAST  TECHNIQUE: Multidetector CT imaging of the chest was performed following the standard protocol without IV contrast.  COMPARISON:  04/01/2015  FINDINGS: Thoracic inlet: No mass or adenopathy. Calcification in the right thyroid lobe.  Mediastinum and hila: Mild enlargement of the heart. Trace amount pericardial fluid. There are dense coronary artery calcifications. Mild enlargement of the pulmonary arteries, right measuring 27 mm in left measuring 28 mm. Aorta is normal in caliber. There is dense atherosclerotic calcification along the thoracic aorta and the aortic arch branch vessels. No mediastinal or hilar masses or pathologically enlarged lymph nodes.  Lungs and pleura: Mild to moderate changes of centrilobular emphysema. There is dependent opacity adjacent to the obliques fissures in both upper lobes most consistent with subsegmental atelectasis. There is a greater degree of atelectasis in the lower lobes adjacent to small bilateral pleural effusions. Bronchial wall thickening is noted in the right middle lobe, left upper lobe lingula and both lower lobes, which may be chronic. Consider bronchitis if there are consistent clinical symptoms. No evidence of lobar pneumonia. There is mild interstitial prominence most evident in the upper lobes. This is most likely chronic. A minor degree of interstitial edema is possible.  Limited upper abdomen: 12 mm left adrenal adenoma. Diffuse vascular calcifications. No acute finding.  Musculoskeletal: Minor depression of the upper endplate of T7. Mild compression fracture of T11. These fractures are likely chronic. Bones are demineralized. No osteoblastic or osteolytic lesions.    IMPRESSION: 1. There is cardiomegaly, small bilateral pleural effusions, mild upper lobe interstitial thickening and a trace amount of pericardial fluid. Mild congestive heart failure is suggested. 2. No evidence of pneumonia. 3. Emphysema and  dependent bilateral lung atelectasis.   Electronically Signed   By: Amie Portland M.D.   On: 04/07/2015 11:10      Assessment and Plan  81 y.o. female with past medical history significant for congestive heart failure last known ejection fraction of 40%, diabetes mellitus, CKD with baseline creatinine of 1.8, recent non-ST segment elevation MI, and left humerus fracture was presented to the hospital secondary to worsening respiratory distress.  1. Acute respiratory failure:  Suspect acute on chronic systolic CHF/flash pulmonary edema with underlying emphysema  upon admission with repeat flare on 5/5 requiring extra dose of IV Lasix 40 mg and BiPAP.Currently weaned off BiPAP and on 2L O2 via nasal cannula. -Possible exacerbation secondary to underlying arrhythmia (PVCs in bigeminal pattern) -Currently on lasix 40 IV BID -Consider changing to lasix 40 po BID in the near future (but not until after anemia treated) -Continue Toprol XL 25 daily.  -Recent echo 02/2015 with EF 40-45%, PASP 43 mm Hg, will not repeat at this time  2. Ventricular bigeminy/trigeminy:  -Continue Toprol XL. -Plan for  outpatient 30 day event monitor at discharge if she or her daughter would like  -If difficulty maintaining respiratory status, may need to add amiodarone for frequent PVCs  3. Elevated troponin:  -Mildly elevated at 0.05. Likely demand ischemia in the setting of #1 and CKD.  -Previous admission she was deemed to be not an invasive candidate given her dementia, advanced renal insufficiency and anemia.  -Continue medical management with aspirin and simvastatin.   4. Acute on CKD stage IV: -Creatinine close to baseline, continue to monitor closely while on IV Lasix  -BUN/SCr ratio >20 -Avoid ACEi and ARBs given renal insufficiency  5. COPD:  -Home O2 -Off BiPAP currently -Continue O2 via nasal cannula   6. IDDM: -SSI per IM  7. Left arm fracture: -In the setting of increased  weakness -Continue current treatment as advised by treating team  8. Dispo: -She appears too weak for independent living as her daughter would like => needs evaluation for rehab stay.  -She is a DNR   Signed, Eula Listen, PA-C  04/09/2015 8:54 AM  I have seen, examined and evaluated the patient this PM  along with Mr. Shea Evans, [place].  After reviewing all the available data and chart,  I agree with his findings, examination as well as impression recommendations.  Continues to diurese, but I/O not accurately recorded.  Will need to adjust diuresis based upon clinical findings & the potential need for transfusion. She is anemic for the past few Hgb recordings.  Probably consider converting to PO diure  PVCs - stable on BB - agree with OP event monitor    HARDING, Piedad Climes, M.D., M.S. Interventional Cardiologist   Pager # [phone]

## 2015-04-09 NOTE — Care Management (Signed)
CHF or Chronic Pulmonary Condition Patient suffers from  COPD and CHF and has trouble breathing at night when head is elevated less 30 degrees degrees. Bed wedges do not provide enough elevation to resolve breathing issues.  Dyspnea  cause patient to require frequent changes in body position which cannot be achieved with a normal bed.

## 2015-04-10 LAB — CBC
HCT: 26 % — ABNORMAL LOW (ref 35.0–47.0)
HEMOGLOBIN: 8.1 g/dL — AB (ref 12.0–16.0)
MCH: 28.8 pg (ref 26.0–34.0)
MCHC: 31.2 g/dL — ABNORMAL LOW (ref 32.0–36.0)
MCV: 92.2 fL (ref 80.0–100.0)
Platelets: 204 10*3/uL (ref 150–440)
RBC: 2.82 MIL/uL — AB (ref 3.80–5.20)
RDW: 15.9 % — ABNORMAL HIGH (ref 11.5–14.5)
WBC: 12.9 10*3/uL — ABNORMAL HIGH (ref 3.6–11.0)

## 2015-04-10 LAB — GLUCOSE, CAPILLARY
GLUCOSE-CAPILLARY: 186 mg/dL — AB (ref 65–99)
Glucose-Capillary: 354 mg/dL — ABNORMAL HIGH (ref 65–99)
Glucose-Capillary: 208 mg/dL — ABNORMAL HIGH (ref 65–99)
Glucose-Capillary: 189 mg/dL — ABNORMAL HIGH (ref 65–99)
Glucose-Capillary: 133 mg/dL — ABNORMAL HIGH (ref 65–99)
Glucose-Capillary: 258 mg/dL — ABNORMAL HIGH (ref 65–99)

## 2015-04-10 MED ORDER — INSULIN ASPART 100 UNIT/ML ~~LOC~~ SOLN
5.0000 [IU] | Freq: Once | SUBCUTANEOUS | Status: AC
  Administered 2015-04-10: 5 [IU] via SUBCUTANEOUS
  Filled 2015-04-10: qty 5

## 2015-04-10 NOTE — Progress Notes (Signed)
Inpatient Diabetes Program Recommendations  AACE/ADA: New Consensus Statement on Inpatient Glycemic Control (2013)  Target Ranges:  Prepandial:   less than 140 mg/dL      Peak postprandial:   less than 180 mg/dL (1-2 hours)      Critically ill patients:  140 - 180 mg/dL   Results for TENLEE, BROZOWSKI (MRN [id number]) as of 04/10/2015 10:39  Ref. Range 04/09/2015 16:07 04/09/2015 19:46 04/10/2015 02:00 04/10/2015 05:06 04/10/2015 07:34  Glucose-Capillary Latest Ref Range: 65-99 mg/dL 032 (H) 122 (H) 482 (H) 208 (H) 189 (H)   Elevated blood sugar last night, likely as a result of timing of insulin and finger stick blood sugar checks.  No recommendations at this time.  Susette Racer, RN, BA, MHA, CDE Diabetes Coordinator Inpatient Diabetes Program  [phone] (Team Pager) [phone] [facility] Office) 04/10/2015 11:12 AM

## 2015-04-10 NOTE — Progress Notes (Signed)
PT Cancellation Note  Patient Details Name: MADHURI ALPHONSE MRN: [id number] DOB: 1925/01/06   Cancelled Treatment:    Reason Eval/Treat Not Completed: Fatigue/lethargy limiting ability to participate (Unable to awaken pt despite several attempts). Spoke with nursing staff who notes that this is baseline when pt has not gotten enough rest. Nursing states may awaken with with repeated stimuli. Attempted to awaken pt greater than 5 minutes. Pt only grunts. Re attempt treatment tomorrow.   Elsie Stain Bishop 04/10/2015, 1:49 PM

## 2015-04-10 NOTE — Progress Notes (Signed)
[facility] [place] at [facility]   PATIENT NAME: Laura Jefferson    MR#:  [id number]  DATE OF BIRTH:  05/02/1925  SUBJECTIVE:  CHIEF COMPLAINT:   Chief Complaint  Patient presents with  . Respiratory Distress    patient  Has mild SOB, she feels weak.  REVIEW OF SYSTEMS:  CONSTITUTIONAL: No fever, + weakness.  RESPIRATORY: mild shortness of breath, No wheezing or hemoptysis.  CARDIOVASCULAR: No chest pain, orthopnea, edema.  GASTROINTESTINAL: No nausea, vomiting, diarrhea or abdominal pain.  GENITOURINARY: No dysuria, hematuria.  NEUROLOGIC: No tingling, numbness, weakness.  PSYCHIATRY: No anxiety or depression.   DRUG ALLERGIES:  No Known Allergies  VITALS:  Blood pressure 98/52, pulse 64, temperature 97.7 F (36.5 C), temperature source Oral, resp. rate 20, height 5' (1.524 m), weight 50.211 kg (110 lb 11.1 oz), SpO2 100 %.  PHYSICAL EXAMINATION:  GENERAL:  81 y.o.-year-old patient lying in the bed with no acute distress.  EYES: Pupils equal, round, reactive to light and accommodation. No scleral icterus. Extraocular muscles intact.  HEENT: Head atraumatic, normocephalic. Oropharynx and nasopharynx clear.  NECK:  Supple, no jugular venous distention. No thyroid enlargement, no tenderness.  LUNGS: Normal breath sounds bilaterally, b/l basilar rales, but no wheezing,  No use of accessory muscles of respiration.  CARDIOVASCULAR: S1, S2 normal. 3/6 systolic murmur present, no gallops  ABDOMEN: Soft, nontender, nondistended. Bowel sounds present. No organomegaly or mass.  EXTREMITIES: No pedal edema, cyanosis, or clubbing. Wounds with dressings noted on right shin and calf NEUROLOGIC: drowsy  PSYCHIATRIC: The patient drowsy SKIN: No obvious rash, lesion, or ulcer. Skin tear lesions on right shin and calf   LABORATORY PANEL:   CBC  Recent Labs Lab 04/10/15 0458  WBC 12.9*  HGB 8.1*  HCT 26.0*  PLT 204    ------------------------------------------------------------------------------------------------------------------  Chemistries   Recent Labs Lab 04/09/15 0434  NA 137  K 3.6  CL 96*  CO2 30  GLUCOSE 192*  BUN 65*  CREATININE 1.70*  CALCIUM 8.5*  MG 2.1   ------------------------------------------------------------------------------------------------------------------  Cardiac Enzymes No results for input(s): TROPONINI in the last 168 hours. ------------------------------------------------------------------------------------------------------------------  RADIOLOGY:  No results found.  EKG:   Orders placed or performed during the hospital encounter of 04/01/15  . EKG 12-Lead  . EKG 12-Lead  . EKG 12-Lead  . EKG 12-Lead    ASSESSMENT AND PLAN:   81 year old female with past medical history significant for congestive heart failure last known ejection fraction of 40%, diabetes mellitus, CK D with baseline creatinine of 1.8, recent and non-ST segment elevation MI, and left humerus fracture was discharged from rehabilitation to home yesterday presents to the hospital secondary to worsening respiratory distress.   #1 Acute respiratory distress- secondary to acute on chronic systolic CHF exacerbation- Changed back to oral Lasix 40 twice a day Cardiology following. The echo was just done last admission in April 2016 and EF is 40-45%. on metoprolol low dose. not on any ACEI or ARB due to chronic kidney disease. CT non contrast chest shows congestive heart failure without evidence of pneumonia.  #2. Leukocytosis- no evidence of infection,  S/p tx with levaquin, Finished 5 day course for possible bronchitis  #3. Anemia-  started patient on iron therapy. Hemoglobin is low but stable, s/p a dose of Epogen.  #4. DM- on lantus, glipizide and ssi and Lantus  #5. COPD- on 2l oxygen at home, cont. On spiriva- continue  #6. CKD- stable, monitor while being diuresed  #7.  DVT  prophylaxis- lovenox       All the records are reviewed and case discussed with Care Management/Social Workerr. Management plans discussed with the patient, family and they are in agreement.  CODE STATUS: DNR  TOTAL TIME TAKING CARE OF THIS PATIENT  36 min minutes.   D/w daughter recommend she talk to pallative care she agrees   Shaune Pollack M.D on 04/10/2015 at 3:01 PM  Between 7am to 6pm - Pager - [phone]  After 6pm go to [email or web address] - password [email or web address] [facility]  [place] [facility]  Office  [phone]  CC: Primary care physician; Marcine Matar, MD

## 2015-04-10 NOTE — Progress Notes (Signed)
Pt confused. VSS. Complaints of discomfort. Medication given. Incontinent. Perineal care given as needed. 1 assist out of bed. Pt did not receive bedtime insulin coverage. During AM nurse aide reported patient not looking good. CBG checked and it was 354. MD notified. Medication given. Pt resting in bed. Will continue to monitor.

## 2015-04-11 LAB — URINALYSIS COMPLETE WITH MICROSCOPIC (ARMC ONLY)
Bilirubin Urine: NEGATIVE
GLUCOSE, UA: NEGATIVE mg/dL
Hgb urine dipstick: NEGATIVE
Ketones, ur: NEGATIVE mg/dL
Nitrite: NEGATIVE
Protein, ur: NEGATIVE mg/dL
Specific Gravity, Urine: 1.011 (ref 1.005–1.030)
pH: 5 (ref 5.0–8.0)

## 2015-04-11 LAB — CBC
HEMATOCRIT: 25.1 % — AB (ref 35.0–47.0)
Hemoglobin: 8.1 g/dL — ABNORMAL LOW (ref 12.0–16.0)
MCH: 29.6 pg (ref 26.0–34.0)
MCHC: 32.1 g/dL (ref 32.0–36.0)
MCV: 92.3 fL (ref 80.0–100.0)
Platelets: 202 10*3/uL (ref 150–440)
RBC: 2.72 MIL/uL — ABNORMAL LOW (ref 3.80–5.20)
RDW: 16.5 % — AB (ref 11.5–14.5)
WBC: 9.5 10*3/uL (ref 3.6–11.0)

## 2015-04-11 LAB — BASIC METABOLIC PANEL
Anion gap: 9 (ref 5–15)
BUN: 90 mg/dL — ABNORMAL HIGH (ref 6–20)
CHLORIDE: 103 mmol/L (ref 101–111)
CO2: 29 mmol/L (ref 22–32)
Calcium: 8.6 mg/dL — ABNORMAL LOW (ref 8.9–10.3)
Creatinine, Ser: 1.82 mg/dL — ABNORMAL HIGH (ref 0.44–1.00)
GFR calc Af Amer: 28 mL/min — ABNORMAL LOW (ref >60)
GFR calc non Af Amer: 24 mL/min — ABNORMAL LOW (ref >60)
Glucose, Bld: 155 mg/dL — ABNORMAL HIGH (ref 65–99)
Potassium: 3.5 mmol/L (ref 3.5–5.1)
Sodium: 141 mmol/L (ref 135–145)

## 2015-04-11 LAB — GLUCOSE, CAPILLARY
GLUCOSE-CAPILLARY: 178 mg/dL — AB (ref 65–99)
Glucose-Capillary: 184 mg/dL — ABNORMAL HIGH (ref 65–99)
Glucose-Capillary: 248 mg/dL — ABNORMAL HIGH (ref 65–99)

## 2015-04-11 MED ORDER — FUROSEMIDE 20 MG PO TABS
20.0000 mg | ORAL_TABLET | Freq: Two times a day (BID) | ORAL | Status: DC
  Administered 2015-04-11: 20 mg via ORAL
  Filled 2015-04-11: qty 1

## 2015-04-11 NOTE — Discharge Instructions (Signed)
With HHPT, RN, Nurse aid, Child psychotherapist. [facility] transition to hospice.

## 2015-04-11 NOTE — Care Management Note (Signed)
Case Management Note  Patient Details  Name: Laura Jefferson MRN: [id number] Date of Birth: February 04, 1925  Subjective/Objective:                    Action/Plan:   Expected Discharge Date:                  Expected Discharge Plan:     In-House Referral:     Discharge planning Services     Post Acute Care Choice:    Choice offered to:     DME Arranged:    DME Agency:     HH Arranged:    HH Agency:     Status of Service:     Medicare Important Message Given:  Yes Date Medicare IM Given:  04/02/15 Medicare IM give by:  Collie Siad Date Additional Medicare IM Given:  04/11/15  dditional Medicare Important Message give by:  Ermalene Searing  If discussed at Long Length of Stay Meetings, dates discussed:    Additional Comments:  Eber Hong, RN 04/11/2015, 5:00 PM

## 2015-04-11 NOTE — Progress Notes (Signed)
   04/03/15 2046  Oxygen Therapy  SpO2 (!) 88 %

## 2015-04-11 NOTE — Discharge Summary (Signed)
[facility] - [place] at [facility]   PATIENT NAME: Laura Jefferson    MR#:  [id number]  DATE OF BIRTH:  [birth year]  DATE OF ADMISSION:  04/01/2015 ADMITTING PHYSICIAN: Shaune Pollack, MD  DATE OF DISCHARGE: No discharge date for patient encounter.  PRIMARY CARE PHYSICIAN: Marcine Matar, MD    ADMISSION DIAGNOSIS:  Acute exacerbation of congestive heart failure [I50.9]   DISCHARGE DIAGNOSIS:  acute on chronic systolic CHF exacerbation, COPD, CKD, DM, Anemia,    SECONDARY DIAGNOSIS:   Past Medical History  Diagnosis Date  . Muscle weakness   . Non-ST elevated myocardial infarction     a. likely demand ischemia 02/2015 in the setting of ARF and acute on chronic CHF  . Humerus fracture     a. left, b. 02/2015; c. in the setting of increased weakness  . Dysphagia   . Anemia   . Paroxysmal a-fib     a. reported a-fib, no documented EKGs showing this  . Chronic systolic CHF (congestive heart failure)     a. echo 02/2015: EF 40-45%, mod dilated LA, mildly dilated RA, mod MR/TR, mildly elevated PASP 43 mm Hg, no evidence of intracardiac shunting, intact interatrial and interventricular septa  . COPD (chronic obstructive pulmonary disease)     a. on home O2  . Hypertensive chronic kidney disease   . Diabetes mellitus without complication   . PVD (peripheral vascular disease)   . Dementia   . Anxiety disorder     HOSPITAL COURSE:  [name] is a 81 y.o. female who was recently hospitalized after fall, and  Noted to have fall, left humerus fx, arf, acute systolic chf and NSTMI. Pt was then d/c to rehab. In rehab pt was doing better then d/c to home the day before this admission. Pt brought back to hospital by daughter due to developing acute respiratory distress this am. Pt initially had to be placed on bipap, now on nasal canula. Pt has dementia and unable to provide hx. Daughter provided the history. She has not had any fever or chill at home.  CXR: There is mild vascular congestion and BILATERAL pleural effusions consistent with early CHF.  #1 Acute respiratory distress- secondary to acute on chronic systolic CHF exacerbation ( EF is 40-45%) The patient was treated with iv lasix then changed back to oral Lasix 40 twice a day. Lasix will be changed to home dose due to dehydration. The patient symptoms has much improved. She is on home O2 [place] 2L. She is on metoprolol low dose, but not on any ACEI or ARB due to chronic kidney disease.  #2. Leukocytosis- no evidence of infection, S/p tx with levaquin, Finished 5 day course for possible bronchitis. Leukocytosis improved.  #3. Anemia- started patient on iron therapy. Hemoglobin is low but stable, s/p a dose of Epogen.  #4. DM- on lantus, glipizide and ssi and Lantus  #5. COPD- on 2l oxygen at home, continue spiriva  #6. CKD- The patient need follow up BMP due to lasix.  DISCHARGE CONDITIONS:   Stable.  CONSULTS OBTAINED:  Treatment Team:  Antonieta Iba, MD Laurey Morale, MD  DRUG ALLERGIES:  No Known Allergies  DISCHARGE MEDICATIONS:   Current Discharge Medication List    CONTINUE these medications which have NOT CHANGED   Details  Amino Acids-Protein Hydrolys (FEEDING SUPPLEMENT, PRO-STAT SUGAR FREE 64,) LIQD Take 30 mLs by mouth 2 (two) times daily between meals.    aspirin 81 MG tablet  Take 81 mg by mouth daily.    Cholecalciferol (VITAMIN D) 2000 UNITS tablet Take 2,000 Units by mouth daily.    docusate sodium (COLACE) 100 MG capsule Take 100 mg by mouth 2 (two) times daily as needed for mild constipation.    folic acid (FOLVITE) 400 MCG tablet Take 400 mcg by mouth daily.    furosemide (LASIX) 40 MG tablet Take 60 mg by mouth daily.    glipiZIDE (GLUCOTROL XL) 2.5 MG 24 hr tablet Take 2.5 mg by mouth daily with breakfast.    GLUCERNA (GLUCERNA) LIQD Take 237 mLs by mouth 2 (two) times daily between meals.    HYDROcodone-acetaminophen (NORCO/VICODIN)  5-325 MG per tablet Take 1 tablet by mouth every 6 (six) hours as needed for moderate pain.    insulin aspart (NOVOLOG) 100 UNIT/ML injection Inject into the skin as directed. Per Sliding scale- Before meals PRN  Blood sugar: 81-175 give 0 units 176-250 give 3 units 251-325 give 6 units 326-450 give 10 units >450 give 12 units    ipratropium-albuterol (DUONEB) 0.5-2.5 (3) MG/3ML SOLN Take 3 mLs by nebulization every 6 (six) hours as needed.    metoprolol tartrate (LOPRESSOR) 25 MG tablet Take 25 mg by mouth 2 (two) times daily.    nicotine (NICODERM CQ - DOSED IN MG/24 HR) 7 mg/24hr patch Place 7 mg onto the skin daily.    OXYGEN Inhale 2 L into the lungs as needed.    PARoxetine (PAXIL) 20 MG tablet Take 20 mg by mouth daily.    senna (SENOKOT) 8.6 MG TABS tablet Take 1 tablet by mouth 2 (two) times daily as needed for mild constipation.    simvastatin (ZOCOR) 20 MG tablet Take 20 mg by mouth daily.    Tiotropium Bromide Monohydrate 2.5 MCG/ACT AERS Inhale 2 puffs into the lungs daily.     UNABLE TO FIND Diaper dermatitis cream- apply liberally  as needed    vitamin B-12 (CYANOCOBALAMIN) 1000 MCG tablet Take 1,000 mcg by mouth daily.      STOP taking these medications     amoxicillin-clavulanate (AUGMENTIN) 875-125 MG per tablet          DISCHARGE INSTRUCTIONS:    See discharge instruction.   If you experience worsening of your admission symptoms, develop shortness of breath, life threatening emergency, suicidal or homicidal thoughts you must seek medical attention immediately by calling 911 or calling your MD immediately  if symptoms less severe.  You Must read complete instructions/literature along with all the possible adverse reactions/side effects for all the Medicines you take and that have been prescribed to you. Take any new Medicines after you have completely understood and accept all the possible adverse reactions/side effects.   Please note  You were  cared for by a hospitalist during your hospital stay. If you have any questions about your discharge medications or the care you received while you were in the hospital after you are discharged, you can call the unit and asked to speak with the hospitalist on call if the hospitalist that took care of you is not available. Once you are discharged, your primary care physician will handle any further medical issues. Please note that NO REFILLS for any discharge medications will be authorized once you are discharged, as it is imperative that you return to your primary care physician (or establish a relationship with a primary care physician if you do not have one) for your aftercare needs so that they can reassess your need for  medications and monitor your lab values.    Today   SUBJECTIVE   No complaint, on O2NC 2L.   VITAL SIGNS:  Blood pressure 133/43, pulse 63, temperature 99.1 F (37.3 C), temperature source Oral, resp. rate 20, height 5' (1.524 m), weight 47.401 kg (104 lb 8 oz), SpO2 98 %.  I/O:   Intake/Output Summary (Last 24 hours) at 04/11/15 1428 Last data filed at 04/11/15 0830  Gross per 24 hour  Intake    240 ml  Output      0 ml  Net    240 ml    PHYSICAL EXAMINATION:  GENERAL:  81 y.o.-year-old patient lying in the bed with no acute distress.  EYES: Pupils equal, round, reactive to light and accommodation. No scleral icterus. Extraocular muscles intact.  HEENT: Head atraumatic, normocephalic. Oropharynx and nasopharynx clear.  NECK:  Supple, no jugular venous distention. No thyroid enlargement, no tenderness.  LUNGS: Normal breath sounds bilaterally, no wheezing, rales,rhonchi or crepitation. No use of accessory muscles of respiration.  CARDIOVASCULAR: S1, S2 normal. No murmurs, rubs, or gallops.  ABDOMEN: Soft, non-tender, non-distended. Bowel sounds present. No organomegaly or mass.  EXTREMITIES: No pedal edema, cyanosis, or clubbing.  NEUROLOGIC: Cranial nerves II  through XII are intact. Muscle strength 3/5 in all extremities. Sensation intact. Gait not checked.  PSYCHIATRIC: The patient is alert and oriented x 3.    DATA REVIEW:   CBC  Recent Labs Lab 04/11/15 0442  WBC 9.5  HGB 8.1*  HCT 25.1*  PLT 202    Chemistries   Recent Labs Lab 04/09/15 0434 04/11/15 0442  NA 137 141  K 3.6 3.5  CL 96* 103  CO2 30 29  GLUCOSE 192* 155*  BUN 65* 90*  CREATININE 1.70* 1.82*  CALCIUM 8.5* 8.6*  MG 2.1  --     Cardiac Enzymes No results for input(s): TROPONINI in the last 168 hours.  Microbiology Results  Results for orders placed or performed during the hospital encounter of 04/01/15  Blood culture (single)     Status: None   Collection Time: 04/01/15  8:43 AM  Result Value Ref Range Status   Specimen Description BLOOD  Final   Special Requests BLOOD  Final   Culture NO GROWTH 5 DAYS  Final   Report Status 04/06/2015 FINAL  Final  Blood culture (single)     Status: None   Collection Time: 04/01/15  8:43 AM  Result Value Ref Range Status   Specimen Description BLOOD  Final   Special Requests BLOOD  Final   Culture NO GROWTH 5 DAYS  Final   Report Status 04/06/2015 FINAL  Final  Culture, blood (routine x 2)     Status: None   Collection Time: 04/01/15 11:25 AM  Result Value Ref Range Status   Specimen Description BLOOD  Final   Special Requests NONE  Final   Culture NO GROWTH 5 DAYS  Final   Report Status 04/06/2015 FINAL  Final  Culture, blood (routine x 2)     Status: None   Collection Time: 04/01/15 11:37 AM  Result Value Ref Range Status   Specimen Description BLOOD  Final   Special Requests NONE  Final   Culture NO GROWTH 5 DAYS  Final   Report Status 04/06/2015 FINAL  Final    RADIOLOGY:  No results found.      Management plans discussed with the patient, family and they are in agreement.  CODE STATUS:     Code  Status Orders        Start     Ordered   04/01/15 1054  Do not attempt resuscitation  (DNR)   Continuous    Question Answer Comment  In the event of cardiac or respiratory ARREST Do not call a "code blue"   In the event of cardiac or respiratory ARREST Do not perform Intubation, CPR, defibrillation or ACLS   In the event of cardiac or respiratory ARREST Use medication by any route, position, wound care, and other measures to relive pain and suffering. May use oxygen, suction and manual treatment of airway obstruction as needed for comfort.      04/01/15 1053    Advance Directive Documentation        Most Recent Value   Type of Advance Directive  Healthcare Power of Attorney, Living will   Pre-existing out of facility DNR order (yellow form or pink MOST form)     "MOST" Form in Place?        TOTAL TIME TAKING CARE OF THIS PATIENT: 65  minutes.    Shaune Pollack M.D on 04/11/2015 at 2:28 PM  Between 7am to 6pm - Pager - [phone]  After 6pm go to [email or web address] - password [email or web address] [facility]  [place] [facility]  Office  [phone]  CC: Primary care physician; Marcine Matar, MD

## 2015-04-11 NOTE — Progress Notes (Signed)
Patient will require a concentrator in the home due to the increased liter flow of 02.  [employer] relayed that only needed a script for the actual concentrator.  Do not need to re qualify since patient was qualified 04/04/15.  02 sat on room air 5/6 was 88%.  Patient is not able to tolerate being on room air to recheck. [facility] notified of discharge.  Sent referral to [employer] via fax and received confirmation.   04/11/15 0456  Oxygen Therapy  SpO2 99 %  O2 Device Nasal Cannula  O2 Flow Rate (L/min) 2 L/min

## 2015-04-11 NOTE — Progress Notes (Addendum)
Slept well during the night. Incontinent several times. Dressed wound to right leg.  Pleasant and cooperative. 2L O2. No resp distress.

## 2015-04-11 NOTE — Care Management (Signed)
Confirmed that [employer] received the order for the home concentrator and delivery time.  Confirmed with [facility] that patient will be seen over the weekend.

## 2015-04-15 ENCOUNTER — Ambulatory Visit: Payer: Self-pay | Admitting: Cardiovascular Disease

## 2015-04-17 ENCOUNTER — Telehealth: Payer: Self-pay | Admitting: Cardiovascular Disease

## 2015-04-17 NOTE — Telephone Encounter (Signed)
Please call patients daughter about chf referral.  Patient dc with appt to go to chf clinic and has home nurse cme in and weigh her.  Patient insurance may not pay for both please call to discuss chf clinic reason for referral and what they do.

## 2015-04-18 NOTE — Telephone Encounter (Signed)
Spoke w/ pt's daughter.  Discussed w/ her the differences in care of home health RN and the benefits of seeing Clarisa Kindred, FNP. She verbalizes understanding and will call HF clinic to discuss if pt's appt needs to be rescheduled for a later date.  Contact info to HF clinic provided.  She is appreciative and will call back if we can be of further assistance.

## 2015-04-25 ENCOUNTER — Ambulatory Visit: Payer: Medicare PPO | Admitting: Family

## 2015-04-25 ENCOUNTER — Telehealth: Payer: Self-pay | Admitting: Cardiovascular Disease

## 2015-04-25 NOTE — Telephone Encounter (Signed)
Patient was taken off K+ in hospital and is concerned  About not taking in combination with fluid pill.  Please call to let her know if she should be taking or was or was it stopped as contraindicated.

## 2015-04-29 ENCOUNTER — Encounter: Payer: Self-pay | Admitting: Cardiovascular Disease

## 2015-04-29 ENCOUNTER — Ambulatory Visit (INDEPENDENT_AMBULATORY_CARE_PROVIDER_SITE_OTHER): Payer: Medicare PPO | Admitting: Cardiovascular Disease

## 2015-04-29 VITALS — BP 110/50 | HR 72 | Ht 61.0 in | Wt 102.0 lb

## 2015-04-29 DIAGNOSIS — I480 Paroxysmal atrial fibrillation: Secondary | ICD-10-CM | POA: Diagnosis not present

## 2015-04-29 DIAGNOSIS — N184 Chronic kidney disease, stage 4 (severe): Secondary | ICD-10-CM

## 2015-04-29 DIAGNOSIS — I5043 Acute on chronic combined systolic (congestive) and diastolic (congestive) heart failure: Secondary | ICD-10-CM

## 2015-04-29 DIAGNOSIS — I129 Hypertensive chronic kidney disease with stage 1 through stage 4 chronic kidney disease, or unspecified chronic kidney disease: Secondary | ICD-10-CM | POA: Diagnosis not present

## 2015-04-29 DIAGNOSIS — N181 Chronic kidney disease, stage 1: Secondary | ICD-10-CM

## 2015-04-29 DIAGNOSIS — N183 Chronic kidney disease, stage 3 (moderate): Secondary | ICD-10-CM

## 2015-04-29 DIAGNOSIS — N182 Chronic kidney disease, stage 2 (mild): Secondary | ICD-10-CM

## 2015-04-29 DIAGNOSIS — J441 Chronic obstructive pulmonary disease with (acute) exacerbation: Secondary | ICD-10-CM | POA: Diagnosis not present

## 2015-04-29 DIAGNOSIS — N189 Chronic kidney disease, unspecified: Secondary | ICD-10-CM

## 2015-04-29 DIAGNOSIS — N185 Chronic kidney disease, stage 5: Secondary | ICD-10-CM

## 2015-04-29 NOTE — Assessment & Plan Note (Signed)
BMP today. I'm concerned about overdiuresis

## 2015-04-29 NOTE — Telephone Encounter (Signed)
Seen today in clinic Restarted potassium and ordered a BMP

## 2015-04-29 NOTE — Patient Instructions (Signed)
You are doing well.  We will check labs today  Please restart potassium twice a day Hold the afternoon lasix, stay on the morning dose for now OK to hold lasix in the AM if she shows signs of dehydration  Try miralex 2 to 3 times a week for constipation, Add iron one a day for anemia  Please call us if you have new issues that need to be addressed before your next appt.  Your physician wants you to follow-up in: 6 months.  You will receive a reminder letter in the mail two months in advance. If you don't receive a letter, please call our office to schedule the follow-up appointment.

## 2015-04-29 NOTE — Assessment & Plan Note (Signed)
Breathing has significantly improved. Now tolerating room air, maintaining saturations in the mid 90s

## 2015-04-29 NOTE — Assessment & Plan Note (Signed)
We have ordered a BMP today in the office Suggested she restart her potassium twice per day Would discontinue the Lasix in the afternoon

## 2015-04-29 NOTE — Progress Notes (Signed)
Patient ID: Laura Jefferson, female    DOB: 05-27-1925, 81 y.o.   MRN: [id number]  HPI Comments: ARTEMIS KOLLER is a 81 y.o. female with history of dementia, hypertension, diabetes, chronic kidney disease, anemia, who presents for follow-up of her chronic diastolic CHF.  She presents with family. She is a poor historian They report her oxygenation has been excellent, tolerating long periods without nasal cannula oxygen. She continues to walk with assistance.  Family wonders if she should be taking potassium. Also she is very thirsty They deny any shortness of breath, PND, orthopnea, leg edema Overall he feels that she is doing well. She stopped smoking 2 months ago  EKG on today's visit shows normal sinus rhythm with rate 72 bpm, no significant ST or T-wave changes  Other past medical history  02/28/2015, fell,  fracturing her left humerus.   She had an echocardiogram which revealed mild left ventricular dysfunction with an ejection fraction of 40-45%. She had moderate left atrial dilatation, mild right atrial dilatation, moderate mitral regurgitation, moderate tricuspid regurgitation. She had mild - moderate pulmonary artery hypertension with an estimated PA pressure of 43 mm hg.    found to have chronic kidney disease, significant anemia with hemoglobin of 7.5 that later dropped to 6.8 with hydration. She gradually improved and was discharged to [facility].   Problem List 1.  Hx of Atrial fib 2. Dementia 3. Essential Hypertension 4. CHF 5. Diabetes Mellitus 6. Chronic kidney disease- estimated GFR of 24 7. Anemia   No Known Allergies  Current Outpatient Prescriptions on File Prior to Visit  Medication Sig Dispense Refill  . Amino Acids-Protein Hydrolys (FEEDING SUPPLEMENT, PRO-STAT SUGAR FREE 64,) LIQD Take 30 mLs by mouth 2 (two) times daily between meals.    Marland Kitchen aspirin 81 MG tablet Take 81 mg by mouth daily.    . Cholecalciferol (VITAMIN D) 2000 UNITS  tablet Take 2,000 Units by mouth daily.    . folic acid (FOLVITE) 400 MCG tablet Take 400 mcg by mouth daily.    . furosemide (LASIX) 40 MG tablet Take 60 mg by mouth daily.    Marland Kitchen glipiZIDE (GLUCOTROL XL) 2.5 MG 24 hr tablet Take 2.5 mg by mouth daily with breakfast.    . GLUCERNA (GLUCERNA) LIQD Take 237 mLs by mouth 2 (two) times daily between meals.    Marland Kitchen HYDROcodone-acetaminophen (NORCO/VICODIN) 5-325 MG per tablet Take 1 tablet by mouth every 6 (six) hours as needed for moderate pain.    Marland Kitchen ipratropium-albuterol (DUONEB) 0.5-2.5 (3) MG/3ML SOLN Take 3 mLs by nebulization every 6 (six) hours as needed.    . metoprolol tartrate (LOPRESSOR) 25 MG tablet Take 25 mg by mouth 2 (two) times daily.    . nicotine (NICODERM CQ - DOSED IN MG/24 HR) 7 mg/24hr patch Place 7 mg onto the skin daily.    . OXYGEN Inhale 1 L into the lungs as needed.     Marland Kitchen PARoxetine (PAXIL) 20 MG tablet Take 20 mg by mouth daily.    Marland Kitchen senna (SENOKOT) 8.6 MG TABS tablet Take 1 tablet by mouth 2 (two) times daily as needed for mild constipation.    . simvastatin (ZOCOR) 20 MG tablet Take 20 mg by mouth daily.    . Tiotropium Bromide Monohydrate 2.5 MCG/ACT AERS Inhale 2 puffs into the lungs daily.     Marland Kitchen UNABLE TO FIND Diaper dermatitis cream- apply liberally  as needed     No current facility-administered medications on  file prior to visit.    Past Medical History  Diagnosis Date  . Muscle weakness   . Non-ST elevated myocardial infarction     a. likely demand ischemia 02/2015 in the setting of ARF and acute on chronic CHF  . Humerus fracture     a. left, b. 02/2015; c. in the setting of increased weakness  . Dysphagia   . Anemia   . Paroxysmal a-fib     a. reported a-fib, no documented EKGs showing this  . Chronic systolic CHF (congestive heart failure)     a. echo 02/2015: EF 40-45%, mod dilated LA, mildly dilated RA, mod MR/TR, mildly elevated PASP 43 mm Hg, no evidence of intracardiac shunting, intact interatrial and  interventricular septa  . COPD (chronic obstructive pulmonary disease)     a. on home O2  . Hypertensive chronic kidney disease   . Diabetes mellitus without complication   . PVD (peripheral vascular disease)   . Dementia   . Anxiety disorder     Past Surgical History  Procedure Laterality Date  . Arm surgery    . Peripheral arterial stent graft      Social History  reports that she quit smoking about 2 months ago. Her smoking use included Cigarettes. She quit after 70 years of use. She does not have any smokeless tobacco history on file. She reports that she does not drink alcohol or use illicit drugs.  Family History family history includes CAD in her son.   Review of Systems  Respiratory: Negative.   Cardiovascular: Negative.   Gastrointestinal: Negative.   Musculoskeletal: Positive for gait problem.  Skin: Negative.   Neurological: Positive for weakness.  Hematological: Negative.   Psychiatric/Behavioral: Positive for confusion.  All other systems reviewed and are negative.   BP 110/50 mmHg  Pulse 72  Ht 5\' 1"  (1.549 m)  Wt 102 lb (46.267 kg)  BMI 19.28 kg/m2  Physical Exam  Constitutional: She is oriented to person, place, and time. She appears well-developed and well-nourished.  Thin, frail, presenting in a wheelchair  HENT:  Head: Normocephalic.  Nose: Nose normal.  Mouth/Throat: Oropharynx is clear and moist.  Eyes: Conjunctivae are normal. Pupils are equal, round, and reactive to light.  Neck: Normal range of motion. Neck supple. No JVD present.  Cardiovascular: Normal rate, regular rhythm, normal heart sounds and intact distal pulses.  Exam reveals no gallop and no friction rub.   No murmur heard. Pulmonary/Chest: Effort normal and breath sounds normal. No respiratory distress. She has no wheezes. She has no rales. She exhibits no tenderness.  Abdominal: Soft. Bowel sounds are normal. She exhibits no distension. There is no tenderness.  Musculoskeletal:  Normal range of motion. She exhibits no edema or tenderness.  Lymphadenopathy:    She has no cervical adenopathy.  Neurological: She is alert and oriented to person, place, and time. Coordination normal.  Skin: Skin is warm and dry. No rash noted. No erythema.  Psychiatric: She has a normal mood and affect. Her behavior is normal. Judgment and thought content normal.

## 2015-04-29 NOTE — Assessment & Plan Note (Signed)
Maintaining normal sinus rhythm. No changes to her medications 

## 2015-04-30 LAB — BASIC METABOLIC PANEL

## 2015-05-01 ENCOUNTER — Telehealth: Payer: Self-pay | Admitting: Cardiovascular Disease

## 2015-05-01 NOTE — Telephone Encounter (Signed)
HH nurse calling to get verbal order for labs.  Transferred to [facility]

## 2015-05-22 ENCOUNTER — Encounter: Payer: Medicare PPO | Admitting: Cardiovascular Disease

## 2015-06-19 ENCOUNTER — Telehealth: Payer: Self-pay | Admitting: *Deleted

## 2015-06-19 NOTE — Telephone Encounter (Signed)
Pt daughter calling asking if risperidone is okay to take She is taking this for Sundowns and since pt is heart pt  And daughter would like to know if this will also interact with any of her medications Please advise.

## 2015-06-19 NOTE — Telephone Encounter (Signed)
Forward to MD to advise

## 2015-06-20 NOTE — Telephone Encounter (Signed)
That is fine 

## 2015-06-20 NOTE — Telephone Encounter (Signed)
S/w pt daugtherTyra to inform her of Dr. Jari Sportsman recommendations.  Daughter verbalizes concern with taking this medication. I reiterated Dr. Jari Sportsman response that risperidone is fine for the pt to take. Daughter verbalized understanding with no further questions.

## 2015-07-22 ENCOUNTER — Encounter: Payer: Self-pay | Admitting: Emergency Medicine

## 2015-07-22 ENCOUNTER — Emergency Department: Payer: Medicare PPO

## 2015-07-22 ENCOUNTER — Inpatient Hospital Stay
Admission: EM | Admit: 2015-07-22 | Discharge: 2015-07-25 | DRG: 291 | Disposition: A | Discharge status: Home / Self Care | Payer: Medicare PPO | Attending: Internal Medicine | Admitting: Internal Medicine

## 2015-07-22 DIAGNOSIS — N179 Acute kidney failure, unspecified: Secondary | ICD-10-CM

## 2015-07-22 DIAGNOSIS — I739 Peripheral vascular disease, unspecified: Secondary | ICD-10-CM | POA: Diagnosis present

## 2015-07-22 DIAGNOSIS — D509 Iron deficiency anemia, unspecified: Secondary | ICD-10-CM | POA: Insufficient documentation

## 2015-07-22 DIAGNOSIS — N183 Chronic kidney disease, stage 3 (moderate): Secondary | ICD-10-CM | POA: Diagnosis present

## 2015-07-22 DIAGNOSIS — I252 Old myocardial infarction: Secondary | ICD-10-CM

## 2015-07-22 DIAGNOSIS — I509 Heart failure, unspecified: Secondary | ICD-10-CM

## 2015-07-22 DIAGNOSIS — I129 Hypertensive chronic kidney disease with stage 1 through stage 4 chronic kidney disease, or unspecified chronic kidney disease: Secondary | ICD-10-CM | POA: Diagnosis present

## 2015-07-22 DIAGNOSIS — J441 Chronic obstructive pulmonary disease with (acute) exacerbation: Secondary | ICD-10-CM

## 2015-07-22 DIAGNOSIS — E1121 Type 2 diabetes mellitus with diabetic nephropathy: Secondary | ICD-10-CM | POA: Diagnosis present

## 2015-07-22 DIAGNOSIS — Z87891 Personal history of nicotine dependence: Secondary | ICD-10-CM

## 2015-07-22 DIAGNOSIS — T380X5A Adverse effect of glucocorticoids and synthetic analogues, initial encounter: Secondary | ICD-10-CM | POA: Diagnosis present

## 2015-07-22 DIAGNOSIS — F0390 Unspecified dementia without behavioral disturbance: Secondary | ICD-10-CM | POA: Diagnosis present

## 2015-07-22 DIAGNOSIS — Z7982 Long term (current) use of aspirin: Secondary | ICD-10-CM

## 2015-07-22 DIAGNOSIS — Z66 Do not resuscitate: Secondary | ICD-10-CM | POA: Diagnosis present

## 2015-07-22 DIAGNOSIS — R079 Chest pain, unspecified: Secondary | ICD-10-CM

## 2015-07-22 DIAGNOSIS — J9621 Acute and chronic respiratory failure with hypoxia: Secondary | ICD-10-CM

## 2015-07-22 DIAGNOSIS — I501 Left ventricular failure: Secondary | ICD-10-CM

## 2015-07-22 DIAGNOSIS — I5043 Acute on chronic combined systolic (congestive) and diastolic (congestive) heart failure: Principal | ICD-10-CM | POA: Diagnosis present

## 2015-07-22 DIAGNOSIS — Z79891 Long term (current) use of opiate analgesic: Secondary | ICD-10-CM

## 2015-07-22 DIAGNOSIS — R0600 Dyspnea, unspecified: Secondary | ICD-10-CM

## 2015-07-22 DIAGNOSIS — D638 Anemia in other chronic diseases classified elsewhere: Secondary | ICD-10-CM | POA: Diagnosis present

## 2015-07-22 DIAGNOSIS — R059 Cough, unspecified: Secondary | ICD-10-CM

## 2015-07-22 DIAGNOSIS — J209 Acute bronchitis, unspecified: Secondary | ICD-10-CM

## 2015-07-22 DIAGNOSIS — I480 Paroxysmal atrial fibrillation: Secondary | ICD-10-CM | POA: Diagnosis present

## 2015-07-22 DIAGNOSIS — J440 Chronic obstructive pulmonary disease with acute lower respiratory infection: Secondary | ICD-10-CM | POA: Diagnosis present

## 2015-07-22 DIAGNOSIS — Z8249 Family history of ischemic heart disease and other diseases of the circulatory system: Secondary | ICD-10-CM

## 2015-07-22 DIAGNOSIS — N189 Chronic kidney disease, unspecified: Secondary | ICD-10-CM

## 2015-07-22 DIAGNOSIS — R05 Cough: Secondary | ICD-10-CM

## 2015-07-22 DIAGNOSIS — R9431 Abnormal electrocardiogram [ECG] [EKG]: Secondary | ICD-10-CM

## 2015-07-22 DIAGNOSIS — Z79899 Other long term (current) drug therapy: Secondary | ICD-10-CM

## 2015-07-22 DIAGNOSIS — Z9981 Dependence on supplemental oxygen: Secondary | ICD-10-CM

## 2015-07-22 LAB — CBC
HEMATOCRIT: 25.5 % — AB (ref 35.0–47.0)
Hemoglobin: 7.8 g/dL — ABNORMAL LOW (ref 12.0–16.0)
MCH: 24.7 pg — ABNORMAL LOW (ref 26.0–34.0)
MCHC: 30.6 g/dL — ABNORMAL LOW (ref 32.0–36.0)
MCV: 80.6 fL (ref 80.0–100.0)
PLATELETS: 243 10*3/uL (ref 150–440)
RBC: 3.17 MIL/uL — AB (ref 3.80–5.20)
RDW: 17.9 % — AB (ref 11.5–14.5)
WBC: 9.3 10*3/uL (ref 3.6–11.0)

## 2015-07-22 LAB — BRAIN NATRIURETIC PEPTIDE
B NATRIURETIC PEPTIDE 5: 4128 pg/mL — AB (ref 0.0–100.0)
B NATRIURETIC PEPTIDE 5: 3897 pg/mL — AB (ref 0.0–100.0)

## 2015-07-22 LAB — COMPREHENSIVE METABOLIC PANEL
ALT: 13 U/L — ABNORMAL LOW (ref 14–54)
AST: 22 U/L (ref 15–41)
Albumin: 3.5 g/dL (ref 3.5–5.0)
Alkaline Phosphatase: 60 U/L (ref 38–126)
Anion gap: 11 (ref 5–15)
BILIRUBIN TOTAL: 0.2 mg/dL — AB (ref 0.3–1.2)
BUN: 53 mg/dL — AB (ref 6–20)
CO2: 26 mmol/L (ref 22–32)
Calcium: 8.9 mg/dL (ref 8.9–10.3)
Chloride: 105 mmol/L (ref 101–111)
Creatinine, Ser: 2.06 mg/dL — ABNORMAL HIGH (ref 0.44–1.00)
GFR, EST AFRICAN AMERICAN: 24 mL/min — AB (ref >60)
GFR, EST NON AFRICAN AMERICAN: 21 mL/min — AB (ref >60)
Glucose, Bld: 151 mg/dL — ABNORMAL HIGH (ref 65–99)
POTASSIUM: 4.4 mmol/L (ref 3.5–5.1)
Sodium: 142 mmol/L (ref 135–145)
TOTAL PROTEIN: 7.4 g/dL (ref 6.5–8.1)

## 2015-07-22 LAB — GLUCOSE, CAPILLARY: GLUCOSE-CAPILLARY: 149 mg/dL — AB (ref 65–99)

## 2015-07-22 LAB — TROPONIN I
Troponin I: 0.03 ng/mL (ref <0.031)
Troponin I: 0.03 ng/mL (ref <0.031)

## 2015-07-22 MED ORDER — ALBUTEROL SULFATE (2.5 MG/3ML) 0.083% IN NEBU: 2.5000 mg | INHALATION_SOLUTION | Freq: Four times a day (QID) | RESPIRATORY_TRACT | Status: DC | PRN

## 2015-07-22 MED ORDER — FUROSEMIDE 20 MG PO TABS: 40.0000 mg | ORAL_TABLET | Freq: Every day | ORAL | Status: DC

## 2015-07-22 MED ORDER — FUROSEMIDE 10 MG/ML IJ SOLN
40.0000 mg | Freq: Once | INTRAMUSCULAR | Status: AC
  Administered 2015-07-22: 40 mg via INTRAVENOUS
  Filled 2015-07-22: qty 4

## 2015-07-22 MED ORDER — ALBUTEROL SULFATE (2.5 MG/3ML) 0.083% IN NEBU
2.5000 mg | INHALATION_SOLUTION | Freq: Four times a day (QID) | RESPIRATORY_TRACT | Status: DC
  Administered 2015-07-22 – 2015-07-23 (×4): 2.5 mg via RESPIRATORY_TRACT
  Filled 2015-07-22 (×5): qty 3

## 2015-07-22 MED ORDER — TIOTROPIUM BROMIDE MONOHYDRATE 2.5 MCG/ACT IN AERS: 2.0000 | INHALATION_SPRAY | Freq: Every day | RESPIRATORY_TRACT | Status: DC

## 2015-07-22 MED ORDER — FUROSEMIDE 10 MG/ML IJ SOLN
20.0000 mg | Freq: Two times a day (BID) | INTRAMUSCULAR | Status: DC
  Administered 2015-07-23: 20 mg via INTRAVENOUS
  Filled 2015-07-22: qty 2

## 2015-07-22 MED ORDER — ENOXAPARIN SODIUM 30 MG/0.3ML ~~LOC~~ SOLN
30.0000 mg | SUBCUTANEOUS | Status: DC
  Administered 2015-07-23: 30 mg via SUBCUTANEOUS
  Filled 2015-07-22: qty 0.3

## 2015-07-22 MED ORDER — BUDESONIDE 0.25 MG/2ML IN SUSP
0.2500 mg | Freq: Two times a day (BID) | RESPIRATORY_TRACT | Status: DC
  Administered 2015-07-22 – 2015-07-25 (×5): 0.25 mg via RESPIRATORY_TRACT
  Filled 2015-07-22 (×6): qty 2

## 2015-07-22 MED ORDER — VITAMIN B-12 1000 MCG PO TABS
1000.0000 ug | ORAL_TABLET | Freq: Every day | ORAL | Status: DC
  Administered 2015-07-23 – 2015-07-25 (×3): 1000 ug via ORAL
  Filled 2015-07-22 (×3): qty 1

## 2015-07-22 MED ORDER — SODIUM CHLORIDE 0.9 % IJ SOLN
3.0000 mL | INTRAMUSCULAR | Status: DC | PRN
  Administered 2015-07-23 – 2015-07-24 (×3): 3 mL via INTRAVENOUS
  Filled 2015-07-22 (×3): qty 10

## 2015-07-22 MED ORDER — PAROXETINE HCL 20 MG PO TABS
20.0000 mg | ORAL_TABLET | Freq: Every day | ORAL | Status: DC
  Administered 2015-07-23 – 2015-07-25 (×3): 20 mg via ORAL
  Filled 2015-07-22 (×3): qty 1

## 2015-07-22 MED ORDER — ALBUTEROL SULFATE (2.5 MG/3ML) 0.083% IN NEBU
2.5000 mg | INHALATION_SOLUTION | RESPIRATORY_TRACT | Status: DC | PRN
  Administered 2015-07-23: 2.5 mg via RESPIRATORY_TRACT
  Filled 2015-07-22: qty 3

## 2015-07-22 MED ORDER — ASPIRIN EC 81 MG PO TBEC
81.0000 mg | DELAYED_RELEASE_TABLET | Freq: Every day | ORAL | Status: DC
  Administered 2015-07-22 – 2015-07-24 (×3): 81 mg via ORAL
  Filled 2015-07-22 (×3): qty 1

## 2015-07-22 MED ORDER — HYDROCODONE-ACETAMINOPHEN 5-325 MG PO TABS: 1.0000 | ORAL_TABLET | Freq: Four times a day (QID) | ORAL | Status: DC | PRN

## 2015-07-22 MED ORDER — INSULIN ASPART 100 UNIT/ML ~~LOC~~ SOLN
0.0000 [IU] | Freq: Every day | SUBCUTANEOUS | Status: DC
  Administered 2015-07-24: 22:00:00 3 [IU] via SUBCUTANEOUS
  Filled 2015-07-22: qty 3

## 2015-07-22 MED ORDER — TIOTROPIUM BROMIDE MONOHYDRATE 18 MCG IN CAPS
18.0000 ug | ORAL_CAPSULE | Freq: Every day | RESPIRATORY_TRACT | Status: DC
  Administered 2015-07-23 – 2015-07-25 (×3): 18 ug via RESPIRATORY_TRACT
  Filled 2015-07-22: qty 5

## 2015-07-22 MED ORDER — PSYLLIUM 95 % PO PACK
1.0000 | PACK | Freq: Every day | ORAL | Status: DC
  Administered 2015-07-23: 1 via ORAL
  Administered 2015-07-24: 13:00:00 via ORAL
  Administered 2015-07-25: 1 via ORAL
  Filled 2015-07-22 (×3): qty 1

## 2015-07-22 MED ORDER — ACETAMINOPHEN 325 MG PO TABS: 650.0000 mg | ORAL_TABLET | Freq: Four times a day (QID) | ORAL | Status: DC | PRN

## 2015-07-22 MED ORDER — DOCUSATE SODIUM 100 MG PO CAPS
100.0000 mg | ORAL_CAPSULE | Freq: Two times a day (BID) | ORAL | Status: DC | PRN
  Administered 2015-07-24: 22:00:00 200 mg via ORAL
  Filled 2015-07-22: qty 2

## 2015-07-22 MED ORDER — RISPERIDONE 0.25 MG PO TABS
0.2500 mg | ORAL_TABLET | Freq: Every day | ORAL | Status: DC
  Administered 2015-07-22 – 2015-07-24 (×3): 0.25 mg via ORAL
  Filled 2015-07-22 (×6): qty 1

## 2015-07-22 MED ORDER — METHYLPREDNISOLONE SODIUM SUCC 125 MG IJ SOLR
60.0000 mg | Freq: Four times a day (QID) | INTRAMUSCULAR | Status: DC
  Administered 2015-07-22 – 2015-07-25 (×10): 60 mg via INTRAVENOUS
  Filled 2015-07-22 (×10): qty 2

## 2015-07-22 MED ORDER — ASPIRIN 81 MG PO CHEW
324.0000 mg | CHEWABLE_TABLET | Freq: Once | ORAL | Status: AC
  Administered 2015-07-22: 324 mg via ORAL
  Filled 2015-07-22: qty 4

## 2015-07-22 MED ORDER — SODIUM CHLORIDE 0.9 % IJ SOLN
3.0000 mL | Freq: Two times a day (BID) | INTRAMUSCULAR | Status: DC
  Administered 2015-07-22 – 2015-07-25 (×5): 3 mL via INTRAVENOUS

## 2015-07-22 MED ORDER — LEVOFLOXACIN 500 MG PO TABS
500.0000 mg | ORAL_TABLET | Freq: Every day | ORAL | Status: DC
  Administered 2015-07-22 – 2015-07-23 (×2): 500 mg via ORAL
  Filled 2015-07-22 (×2): qty 1

## 2015-07-22 MED ORDER — INSULIN ASPART 100 UNIT/ML ~~LOC~~ SOLN
4.0000 [IU] | Freq: Three times a day (TID) | SUBCUTANEOUS | Status: DC
  Administered 2015-07-23 – 2015-07-25 (×6): 4 [IU] via SUBCUTANEOUS
  Filled 2015-07-22 (×6): qty 4

## 2015-07-22 MED ORDER — POTASSIUM CHLORIDE CRYS ER 20 MEQ PO TBCR
20.0000 meq | EXTENDED_RELEASE_TABLET | Freq: Two times a day (BID) | ORAL | Status: DC
  Administered 2015-07-22 – 2015-07-23 (×3): 20 meq via ORAL
  Filled 2015-07-22 (×3): qty 1

## 2015-07-22 MED ORDER — NITROGLYCERIN 2 % TD OINT
1.0000 [in_us] | TOPICAL_OINTMENT | Freq: Four times a day (QID) | TRANSDERMAL | Status: DC
  Administered 2015-07-23 – 2015-07-25 (×8): 1 [in_us] via TOPICAL
  Filled 2015-07-22 (×8): qty 1

## 2015-07-22 MED ORDER — ACETAMINOPHEN 650 MG RE SUPP: 650.0000 mg | Freq: Four times a day (QID) | RECTAL | Status: DC | PRN

## 2015-07-22 MED ORDER — DIAZEPAM 5 MG PO TABS
10.0000 mg | ORAL_TABLET | Freq: Every day | ORAL | Status: DC | PRN
  Administered 2015-07-23: 11:00:00 10 mg via ORAL
  Administered 2015-07-24: 5 mg via ORAL
  Filled 2015-07-22: qty 2
  Filled 2015-07-22 (×2): qty 1

## 2015-07-22 MED ORDER — METOPROLOL TARTRATE 25 MG PO TABS
25.0000 mg | ORAL_TABLET | Freq: Two times a day (BID) | ORAL | Status: DC
  Administered 2015-07-23 – 2015-07-25 (×4): 25 mg via ORAL
  Filled 2015-07-22 (×5): qty 1

## 2015-07-22 MED ORDER — SODIUM CHLORIDE 0.9 % IV SOLN: 250.0000 mL | INTRAVENOUS | Status: DC | PRN

## 2015-07-22 MED ORDER — SIMVASTATIN 20 MG PO TABS
20.0000 mg | ORAL_TABLET | Freq: Every day | ORAL | Status: DC
  Administered 2015-07-22 – 2015-07-24 (×3): 20 mg via ORAL
  Filled 2015-07-22 (×3): qty 1

## 2015-07-22 MED ORDER — FOLIC ACID 1 MG PO TABS
1.0000 mg | ORAL_TABLET | Freq: Every day | ORAL | Status: DC
  Administered 2015-07-23 – 2015-07-24 (×2): 1 mg via ORAL
  Filled 2015-07-22 (×2): qty 1

## 2015-07-22 MED ORDER — GUAIFENESIN ER 600 MG PO TB12
600.0000 mg | ORAL_TABLET | Freq: Two times a day (BID) | ORAL | Status: DC
  Administered 2015-07-22 – 2015-07-25 (×6): 600 mg via ORAL
  Filled 2015-07-22 (×6): qty 1

## 2015-07-22 MED ORDER — GLIPIZIDE ER 10 MG PO TB24
10.0000 mg | ORAL_TABLET | Freq: Every day | ORAL | Status: DC
  Administered 2015-07-23 – 2015-07-25 (×3): 10 mg via ORAL
  Filled 2015-07-22 (×4): qty 1

## 2015-07-22 MED ORDER — INSULIN ASPART 100 UNIT/ML ~~LOC~~ SOLN
0.0000 [IU] | Freq: Three times a day (TID) | SUBCUTANEOUS | Status: DC
  Administered 2015-07-23: 10 [IU] via SUBCUTANEOUS
  Administered 2015-07-23: 2 [IU] via SUBCUTANEOUS
  Administered 2015-07-23: 10 [IU] via SUBCUTANEOUS
  Administered 2015-07-24 (×2): 2 [IU] via SUBCUTANEOUS
  Administered 2015-07-25: 08:00:00 3 [IU] via SUBCUTANEOUS
  Filled 2015-07-22: qty 3
  Filled 2015-07-22: qty 10
  Filled 2015-07-22 (×2): qty 2
  Filled 2015-07-22: qty 10

## 2015-07-22 NOTE — ED Provider Notes (Signed)
[facility] Emergency Department Provider Note  ____________________________________________  Time seen: Approximately 4:02 PM  I have reviewed the triage vital signs and the nursing notes.   HISTORY  Chief Complaint Shortness of Breath    HPI Laura Jefferson is a 81 y.o. female history of dementia, congestive heart failure, paroxysmal atrial fibrillation, and diabetes.  Patient reports along with her family that she has been having a nonproductive cough with mild shortness of breath for about the last 3 days. Family reports that she has been coughing frequently, and that they did have to increase her oxygen level to 1 L during the day, when she used to not usually uses it during the day. Only using at night. She denies any pain. No fevers or chills. No swelling or weight gain. No previous history of blood clots. Grossly no chest pain. No confusion, though she does have dementia.  Eating slightly less the last 2 days.  Past Medical History  Diagnosis Date  . Muscle weakness   . Non-ST elevated myocardial infarction     a. likely demand ischemia 02/2015 in the setting of ARF and acute on chronic CHF  . Humerus fracture     a. left, b. 02/2015; c. in the setting of increased weakness  . Dysphagia   . Anemia   . Paroxysmal a-fib     a. reported a-fib, no documented EKGs showing this  . Chronic systolic CHF (congestive heart failure)     a. echo 02/2015: EF 40-45%, mod dilated LA, mildly dilated RA, mod MR/TR, mildly elevated PASP 43 mm Hg, no evidence of intracardiac shunting, intact interatrial and interventricular septa  . COPD (chronic obstructive pulmonary disease)     a. on home O2  . Hypertensive chronic kidney disease   . Diabetes mellitus without complication   . PVD (peripheral vascular disease)   . Dementia   . Anxiety disorder     Patient Active Problem List   Diagnosis Date Noted  . Demand ischemia 04/02/2015  . Leukocytosis - resolved   04/02/2015  . NSVT (nonsustained ventricular tachycardia) 04/02/2015  . Acute exacerbation of chronic obstructive pulmonary disease (COPD) 04/02/2015  . Chronic combined systolic and diastolic CHF (congestive heart failure)   . Paroxysmal a-fib - by report, no documentation   . Hypertensive chronic kidney disease   . COPD (chronic obstructive pulmonary disease)   . Acute on chronic combined systolic and diastolic CHF (congestive heart failure) 04/01/2015    Past Surgical History  Procedure Laterality Date  . Arm surgery    . Peripheral arterial stent graft      Current Outpatient Rx  Name  Route  Sig  Dispense  Refill  . albuterol (PROVENTIL HFA;VENTOLIN HFA) 108 (90 BASE) MCG/ACT inhaler   Inhalation   Inhale 1 puff into the lungs every 6 (six) hours as needed for wheezing or shortness of breath.         Marland Kitchen aspirin EC 81 MG tablet   Oral   Take 81 mg by mouth at bedtime.         . Cholecalciferol (VITAMIN D) 2000 UNITS tablet   Oral   Take 2,000 Units by mouth daily.         . diazepam (VALIUM) 10 MG tablet   Oral   Take 10 mg by mouth daily as needed for anxiety.         . docusate sodium (COLACE) 100 MG capsule   Oral   Take 100-200  mg by mouth 2 (two) times daily as needed for mild constipation.         . folic acid (FOLVITE) 1 MG tablet   Oral   Take 1 mg by mouth at bedtime.         . furosemide (LASIX) 40 MG tablet   Oral   Take 40 mg by mouth daily.          Marland Kitchen glipiZIDE (GLUCOTROL XL) 10 MG 24 hr tablet   Oral   Take 10 mg by mouth daily.         Marland Kitchen HYDROcodone-acetaminophen (NORCO/VICODIN) 5-325 MG per tablet   Oral   Take 1 tablet by mouth every 6 (six) hours as needed for moderate pain.         . metFORMIN (GLUCOPHAGE) 500 MG tablet   Oral   Take 500-1,000 mg by mouth 2 (two) times daily. Pt takes two every morning and one every evening.         . metoprolol tartrate (LOPRESSOR) 25 MG tablet   Oral   Take 25 mg by mouth 2 (two)  times daily.         Marland Kitchen PARoxetine (PAXIL) 20 MG tablet   Oral   Take 20 mg by mouth daily.         . potassium chloride SA (K-DUR,KLOR-CON) 20 MEQ tablet   Oral   Take 20 mEq by mouth 2 (two) times daily.         . psyllium (METAMUCIL) 58.6 % packet   Oral   Take 1 packet by mouth daily as needed (for constipation).         . risperiDONE (RISPERDAL) 0.5 MG tablet   Oral   Take 0.25 mg by mouth at bedtime.         . simvastatin (ZOCOR) 20 MG tablet   Oral   Take 20 mg by mouth at bedtime.          . Tiotropium Bromide Monohydrate 2.5 MCG/ACT AERS   Inhalation   Inhale 2 puffs into the lungs daily.          . vitamin B-12 (CYANOCOBALAMIN) 1000 MCG tablet   Oral   Take 1,000 mcg by mouth daily.           Allergies Review of patient's allergies indicates no known allergies.  Family History  Problem Relation Age of Onset  . CAD Son     Social History Social History  Substance Use Topics  . Smoking status: Former Smoker -- 70 years    Types: Cigarettes    Quit date: 02/24/2015  . Smokeless tobacco: None  . Alcohol Use: No    Review of Systems Constitutional: No fever/chills Eyes: No visual changes. ENT: No sore throat. Cardiovascular: Denies chest pain. Respiratory: See history of present illness  Gastrointestinal: No abdominal pain.  No nausea, no vomiting.  No diarrhea.  No constipation. Genitourinary: Negative for dysuria. Musculoskeletal: Negative for back pain. Skin: Negative for rash. No leg swelling. Neurological: Negative for headaches, focal weakness or numbness.  10-point ROS otherwise negative.  ____________________________________________   PHYSICAL EXAM:  VITAL SIGNS: ED Triage Vitals  Enc Vitals Group     BP 07/22/15 1534 148/57 mmHg     Pulse Rate 07/22/15 1534 60     Resp 07/22/15 1534 20     Temp --      Temp src --      SpO2 07/22/15 1534 100 %  Weight 07/22/15 1534 103 lb (46.72 kg)     Height 07/22/15 1534  5\' 1"  (1.549 m)     Head Cir --      Peak Flow --      Pain Score --      Pain Loc --      Pain Edu? --      Excl. in GC? --     Constitutional: Alert and oriented to self and family, not to year. Well appearing and in no acute distress. Eyes: Conjunctivae are normal. PERRL. EOMI. Head: Atraumatic. Nose: No congestion/rhinnorhea. Mouth/Throat: Mucous membranes are moist.  Oropharynx non-erythematous. Neck: No stridor.  No JVD Cardiovascular: Normal rate, regular rhythm. Grossly normal heart sounds.  Good peripheral circulation. Respiratory: Mild increase in work of breathing with slight use of accessory muscles, no distress.  No retractions. Clear and upper fields, expiratory rales noted in bases bilaterally. Patient speaks in phrases. Gastrointestinal: Soft and nontender. No distention. No abdominal bruits. No CVA tenderness. Musculoskeletal: No lower extremity tenderness nor edema.  No joint effusions. Venous stasis in the lower extremities without edema. Neurologic:  Normal speech and language. No gross focal neurologic deficits are appreciated. Skin:  Skin is warm, dry and intact. No rash noted. Psychiatric: Mood and affect are calm. Speech and behavior are normal.  ____________________________________________   LABS (all labs ordered are listed, but only abnormal results are displayed)  Labs Reviewed  BRAIN NATRIURETIC PEPTIDE - Abnormal; Notable for the following:    B Natriuretic Peptide 4128.0 (*)    All other components within normal limits  TROPONIN I  CBG MONITORING, ED   ____________________________________________  EKG  Reviewed and interpreted by me Ventricular rate 67 PR 1:30 QRS 96 QTC 500 Normal sinus rhythm, there is notable T-wave inversions in V2 and V3, compared with previous there are now worsening inversions, especially notable in V2 compared with previous  No acute ST elevation, but does appear to be concerning new T-wave  inversions ____________________________________________  RADIOLOGY  DG Chest 2 View (Final result) Result time: 07/22/15 16:46:18   Final result by Rad Results In Interface (07/22/15 16:46:18)   Narrative:   CLINICAL DATA: Cough, congestion, shortness of breath  EXAM: CHEST 2 VIEW  COMPARISON: CT chest dated 04/07/2015  FINDINGS: Cardiomegaly with mild interstitial edema. Small bilateral pleural effusions. No pneumothorax.  IMPRESSION: Cardiomegaly with mild interstitial edema.  Small bilateral pleural effusions.    ____________________________________________   PROCEDURES  Procedure(s) performed: None  Critical Care performed: No  ____________________________________________   INITIAL IMPRESSION / ASSESSMENT AND PLAN / ED COURSE  Pertinent labs & imaging results that were available during my care of the patient were reviewed by me and considered in my medical decision making (see chart for details).  Patient presents with nonproductive cough, mild dyspnea. Slight increase in oxygen use at home, but not demonstrating hypoxia in the emergency room on 1 L. Her exam does not demonstrate evidence of volume overload. Differential diagnosis would certainly include pneumonia though she has no fever and her white blood cell count is normal. Congestive heart failure, though she  no signs of volume overload in the emergency room but we will await on chest x-ray and BNP. No acute cardiac symptoms, but given her age and history we will obtain troponin and EKG. No history of DVT, no signs of DVT. No chest pain, and I suspect that there and no evidence to support pulmonary embolism.  Please note that patient had labs drawn today and did  not wish to have them repeated if not required, purulent urinalysis showed only trace blood and rare bacteria without white cells, nitrites, or leukocyte Estrace.  CBC demonstrated a normal white count, hemoglobin was low at 7.5, normal platelets.  Chemistry demonstrated a potassium of 5.5, BUN of 50, and creatinine of 1.8. GFR 27.  ----------------------------------------- 4:31 PM on 07/22/2015 -----------------------------------------  In further discussion with the patient, she does note that she's been having an aching pain in the left lower chest for the last couple of days. In the setting of her abnormal EKG, this was raise concern for the possibility of coronary ischemia. We will continue to rule her out. We'll administer aspirin.  ----------------------------------------- 5:40 PM on 07/22/2015 -----------------------------------------  Patient family not presently at bedside, but did update the patient on diagnosis or appears to be congestive heart failure. We will give Lasix IV, await BNP and troponin. Patient does have tachypnea, in addition mild use of accessory muscles and currently speaking in short phrases. Given the patient's clinical status, I anticipate likely admission to hospital as we rule out acute cardiac and treatment for congestive heart failure.  \----------------------------------------- 6:56 PM on 07/22/2015 -----------------------------------------  Patient reports she feels improved. After discussing outpatient management versus observation, patient and family elected to be admitted for observation and ongoing treatment of congestive heart failure with interstitial edema notable on chest x-ray. I think this is appropriate as she does have some mild accessory muscle use, and was speaking in short phrases. Continue to rule out cardiac, she did have some mild left-sided chest discomfort and does have T-wave abnormalities on her EKG. ____________________________________________   FINAL CLINICAL IMPRESSION(S) / ED DIAGNOSES  Final diagnoses:  Pulmonary edema with congestive heart failure  Chest pain, moderate coronary artery risk      Sharyn Creamer, MD 07/22/15 1857

## 2015-07-22 NOTE — H&P (Addendum)
[facility] Physicians - [place] at [facility]   PATIENT NAME: Laura Jefferson    MR#:  [id number]  DATE OF BIRTH:  September 26, 1925  DATE OF ADMISSION:  07/22/2015  PRIMARY CARE PHYSICIAN: Marcine Matar, MD   REQUESTING/REFERRING PHYSICIAN:   CHIEF COMPLAINT:   Chief Complaint  Patient presents with  . Shortness of Breath    HISTORY OF PRESENT ILLNESS: Laura Jefferson  is a 81 y.o. female with a known history of long-standing tobacco abuse for at least 70 years, who quit smoking approximately 4 months ago, also history of dementia, COPD, CHF, diabetes, paroxysmal atrial fibrillation who presents to the hospital with complaints of cough, chest congestion, wheezing, sputum production and shortness of breath. According to the patient's daughter, patient has been coughing for the past 3 days, she has lots of chest congestion, wheezing and producing some light yellowish sputum, which is very thick. Patient has difficulty lifting it up. A few days ago she was coughing and couldn't catch her breath, became very sweaty, diaphoretic, patient's daughter initiated her oxygen to 1 L at daytime.  Therefore, this she used oxygen only at night.   PAST MEDICAL HISTORY:   Past Medical History  Diagnosis Date  . Muscle weakness   . Non-ST elevated myocardial infarction     a. likely demand ischemia 02/2015 in the setting of ARF and acute on chronic CHF  . Humerus fracture     a. left, b. 02/2015; c. in the setting of increased weakness  . Dysphagia   . Anemia   . Paroxysmal a-fib     a. reported a-fib, no documented EKGs showing this  . Chronic systolic CHF (congestive heart failure)     a. echo 02/2015: EF 40-45%, mod dilated LA, mildly dilated RA, mod MR/TR, mildly elevated PASP 43 mm Hg, no evidence of intracardiac shunting, intact interatrial and interventricular septa  . COPD (chronic obstructive pulmonary disease)     a. on home O2  . Hypertensive chronic kidney disease   .  Diabetes mellitus without complication   . PVD (peripheral vascular disease)   . Dementia   . Anxiety disorder     PAST SURGICAL HISTORY:  Past Surgical History  Procedure Laterality Date  . Arm surgery    . Peripheral arterial stent graft      SOCIAL HISTORY:  Social History  Substance Use Topics  . Smoking status: Former Smoker -- 70 years    Types: Cigarettes    Quit date: 02/24/2015  . Smokeless tobacco: Not on file  . Alcohol Use: No    FAMILY HISTORY:  Family History  Problem Relation Age of Onset  . CAD Son     DRUG ALLERGIES: No Known Allergies  Review of Systems  Unable to perform ROS: dementia  , cough, sputum production, shortness of breath, wheezing, chest congestion per patient's daughter  MEDICATIONS AT HOME:  Prior to Admission medications   Medication Sig Start Date End Date Taking? Authorizing Provider  albuterol (PROVENTIL HFA;VENTOLIN HFA) 108 (90 BASE) MCG/ACT inhaler Inhale 1 puff into the lungs every 6 (six) hours as needed for wheezing or shortness of breath.   Yes Historical Provider, MD  aspirin EC 81 MG tablet Take 81 mg by mouth at bedtime.   Yes Historical Provider, MD  Cholecalciferol (VITAMIN D) 2000 UNITS tablet Take 2,000 Units by mouth daily.   Yes Historical Provider, MD  diazepam (VALIUM) 10 MG tablet Take 10 mg by mouth daily as needed  for anxiety.   Yes Historical Provider, MD  docusate sodium (COLACE) 100 MG capsule Take 100-200 mg by mouth 2 (two) times daily as needed for mild constipation.   Yes Historical Provider, MD  folic acid (FOLVITE) 1 MG tablet Take 1 mg by mouth at bedtime.   Yes Historical Provider, MD  furosemide (LASIX) 40 MG tablet Take 40 mg by mouth daily.    Yes Historical Provider, MD  glipiZIDE (GLUCOTROL XL) 10 MG 24 hr tablet Take 10 mg by mouth daily.   Yes Historical Provider, MD  HYDROcodone-acetaminophen (NORCO/VICODIN) 5-325 MG per tablet Take 1 tablet by mouth every 6 (six) hours as needed for moderate  pain.   Yes Historical Provider, MD  metFORMIN (GLUCOPHAGE) 500 MG tablet Take 500-1,000 mg by mouth 2 (two) times daily. Pt takes two every morning and one every evening.   Yes Historical Provider, MD  metoprolol tartrate (LOPRESSOR) 25 MG tablet Take 25 mg by mouth 2 (two) times daily.   Yes Historical Provider, MD  PARoxetine (PAXIL) 20 MG tablet Take 20 mg by mouth daily.   Yes Historical Provider, MD  potassium chloride SA (K-DUR,KLOR-CON) 20 MEQ tablet Take 20 mEq by mouth 2 (two) times daily.   Yes Historical Provider, MD  psyllium (METAMUCIL) 58.6 % packet Take 1 packet by mouth daily as needed (for constipation).   Yes Historical Provider, MD  risperiDONE (RISPERDAL) 0.5 MG tablet Take 0.25 mg by mouth at bedtime.   Yes Historical Provider, MD  simvastatin (ZOCOR) 20 MG tablet Take 20 mg by mouth at bedtime.    Yes Historical Provider, MD  Tiotropium Bromide Monohydrate 2.5 MCG/ACT AERS Inhale 2 puffs into the lungs daily.    Yes Historical Provider, MD  vitamin B-12 (CYANOCOBALAMIN) 1000 MCG tablet Take 1,000 mcg by mouth daily.   Yes Historical Provider, MD      PHYSICAL EXAMINATION:   VITAL SIGNS: Blood pressure 143/40, pulse 66, temperature 97.4 F (36.3 C), temperature source Oral, resp. rate 18, height 5\' 1"  (1.549 m), weight 46.72 kg (103 lb), SpO2 96 %.  GENERAL:  81 y.o.-year-old patient lying in the bed with no acute distress. Pale, sitting on the stretcher. Coughing intermittently EYES: Pupils equal, round, reactive to light and accommodation. No scleral icterus. Extraocular muscles intact.  HEENT: Head atraumatic, normocephalic. Oropharynx and nasopharynx clear.  NECK:  Supple, no jugular venous distention. No thyroid enlargement, no tenderness.  LUNGS: Diminished breath sounds bilaterally, with actual upper field wheezing, few rales were heard bilaterally at bases, few rhonchi at bases but no  crepitations. Using accessory muscles of respiration, especially on exertion.   CARDIOVASCULAR: S1, S2 normal. No murmurs, rubs, or gallops.  ABDOMEN: Soft, nontender, nondistended. Bowel sounds present. No organomegaly or mass.  EXTREMITIES: No pedal edema, cyanosis, or clubbing.  NEUROLOGIC: Cranial nerves II through XII are intact. Muscle strength 5/5 in all extremities. Sensation intact. Gait not checked.  PSYCHIATRIC: The patient is alert and disoriented  SKIN: No obvious rash, lesion, or ulcer.   LABORATORY PANEL:   CBC No results for input(s): WBC, HGB, HCT, PLT, MCV, MCH, MCHC, RDW, LYMPHSABS, MONOABS, EOSABS, BASOSABS, BANDABS in the last 168 hours.  Invalid input(s): NEUTRABS, BANDSABD ------------------------------------------------------------------------------------------------------------------  Chemistries  No results for input(s): NA, K, CL, CO2, GLUCOSE, BUN, CREATININE, CALCIUM, MG, AST, ALT, ALKPHOS, BILITOT in the last 168 hours.  Invalid input(s): GFRCGP ------------------------------------------------------------------------------------------------------------------  Cardiac Enzymes  Recent Labs Lab 07/22/15 1713  TROPONINI 0.03   ------------------------------------------------------------------------------------------------------------------  RADIOLOGY: Dg Chest  2 View  07/22/2015   CLINICAL DATA:  Cough, congestion, shortness of breath  EXAM: CHEST  2 VIEW  COMPARISON:  CT chest dated 04/07/2015  FINDINGS: Cardiomegaly with mild interstitial edema. Small bilateral pleural effusions. No pneumothorax.  IMPRESSION: Cardiomegaly with mild interstitial edema.  Small bilateral pleural effusions.   Electronically Signed   By: Charline Bills M.D.   On: 07/22/2015 16:46    EKG: Orders placed or performed during the hospital encounter of 07/22/15  . ED EKG  . ED EKG  . ED EKG  . ED EKG   normal sinus rhythm with sinus arrhythmia at 67 bpm, left axis deviation, T depressions in anterior leads, which are new since May 2016  IMPRESSION  AND PLAN:  Active Problems:   Dyspnea   Cough 1 . COPD exacerbation due to acute bronchitis, start patient on steroids, inhalers, nebulizers and follow clinically 2. Acute bronchitis. Get sputum cultures. Initiate patient on levofloxacin orally 3. Essential Hypertension. Continue outpatient medications 4. Acute on chronic CHF, possibly it is acute exacerbation of congestive heart failure , although she is wheezing and it is concerning for COPD exacerbation as well, we will be continuing, however, patient on diuretics intravenously, following her in's and outs as well weight and clinical condition 5. Abnormal EKG check cardiac enzymes 3. Continue patient on aspirin, metoprolol, Zocor, Lovenox add nitroglycerin topically  All the records are reviewed and case discussed with ED provider. Management plans discussed with the patient, daughter and they are in agreement.  CODE STATUS:    Code Status Orders        Start     Ordered   07/22/15 2001  Do not attempt resuscitation (DNR)   Continuous    Question Answer Comment  In the event of cardiac or respiratory ARREST Do not call a "code blue"   In the event of cardiac or respiratory ARREST Do not perform Intubation, CPR, defibrillation or ACLS   In the event of cardiac or respiratory ARREST Use medication by any route, position, wound care, and other measures to relive pain and suffering. May use oxygen, suction and manual treatment of airway obstruction as needed for comfort.   Comments Discussed with patient's daughter      07/22/15 2001       TOTAL TIME TAKING CARE OF THIS PATIENT: 55 minutes.    Katharina Caper M.D on 07/22/2015 at 8:02 PM  Between 7am to 6pm - Pager - [phone] After 6pm go to [email or web address] - password [email or web address] [facility]  [place] [facility]  Office  [phone]  CC: Primary care physician; Marcine Matar, MD

## 2015-07-22 NOTE — ED Notes (Signed)
Reports sob, seen at [facility] clinic and sent for abnormal labs

## 2015-07-23 LAB — BASIC METABOLIC PANEL
ANION GAP: 11 (ref 5–15)
BUN: 59 mg/dL — ABNORMAL HIGH (ref 6–20)
CALCIUM: 8.7 mg/dL — AB (ref 8.9–10.3)
CO2: 22 mmol/L (ref 22–32)
Chloride: 108 mmol/L (ref 101–111)
Creatinine, Ser: 1.96 mg/dL — ABNORMAL HIGH (ref 0.44–1.00)
GFR, EST AFRICAN AMERICAN: 25 mL/min — AB (ref >60)
GFR, EST NON AFRICAN AMERICAN: 22 mL/min — AB (ref >60)
GLUCOSE: 283 mg/dL — AB (ref 65–99)
POTASSIUM: 4.9 mmol/L (ref 3.5–5.1)
Sodium: 141 mmol/L (ref 135–145)

## 2015-07-23 LAB — CBC
HEMATOCRIT: 23.4 % — AB (ref 35.0–47.0)
HEMOGLOBIN: 7.2 g/dL — AB (ref 12.0–16.0)
MCH: 24.8 pg — ABNORMAL LOW (ref 26.0–34.0)
MCHC: 30.9 g/dL — AB (ref 32.0–36.0)
MCV: 80.3 fL (ref 80.0–100.0)
Platelets: 220 10*3/uL (ref 150–440)
RBC: 2.92 MIL/uL — AB (ref 3.80–5.20)
RDW: 17.9 % — ABNORMAL HIGH (ref 11.5–14.5)
WBC: 7.3 10*3/uL (ref 3.6–11.0)

## 2015-07-23 LAB — GLUCOSE, CAPILLARY
GLUCOSE-CAPILLARY: 86 mg/dL (ref 65–99)
GLUCOSE-CAPILLARY: 424 mg/dL — AB (ref 65–99)
GLUCOSE-CAPILLARY: 432 mg/dL — AB (ref 65–99)
GLUCOSE-CAPILLARY: 157 mg/dL — AB (ref 65–99)
GLUCOSE-CAPILLARY: 158 mg/dL — AB (ref 65–99)

## 2015-07-23 LAB — TROPONIN I
Troponin I: 0.03 ng/mL (ref <0.031)
Troponin I: <0.03 ng/mL (ref <0.031)

## 2015-07-23 LAB — HEMOGLOBIN A1C: HEMOGLOBIN A1C: 6.2 % — AB (ref 4.0–6.0)

## 2015-07-23 MED ORDER — LEVOFLOXACIN 250 MG PO TABS
250.0000 mg | ORAL_TABLET | ORAL | Status: DC
  Administered 2015-07-25: 10:00:00 250 mg via ORAL
  Filled 2015-07-23: qty 1

## 2015-07-23 MED ORDER — INSULIN DETEMIR 100 UNIT/ML ~~LOC~~ SOLN
10.0000 [IU] | Freq: Every day | SUBCUTANEOUS | Status: DC
  Administered 2015-07-23 – 2015-07-24 (×2): 10 [IU] via SUBCUTANEOUS
  Filled 2015-07-23 (×3): qty 0.1

## 2015-07-23 NOTE — Care Management (Signed)
Case management consult in place.  Attempted assessment.  Patient's daughter at bedside, requested for me to return at a later time

## 2015-07-23 NOTE — Progress Notes (Signed)
[facility] Physicians - [place] at [facility]   PATIENT NAME: Laura Jefferson    MR#:  [id number]  DATE OF BIRTH:  Aug 25, 1925  SUBJECTIVE:  admitted for COPD, CHF exacerbation. Patient mainly has COPD flare than CHF. BNP is elevated but patient  symptoms are consistent with severe COPD exacerbation. Having lots of cough and spitting up thick green phlegm.   CHIEF COMPLAINT:   Chief Complaint  Patient presents with  . Shortness of Breath    REVIEW OF SYSTEMS:    Review of Systems  Constitutional: Negative for fever and chills.  HENT: Negative for hearing loss.   Eyes: Negative for blurred vision, double vision and photophobia.  Respiratory: Positive for cough, sputum production and wheezing. Negative for hemoptysis and shortness of breath.   Cardiovascular: Negative for palpitations, orthopnea and leg swelling.  Gastrointestinal: Negative for vomiting, abdominal pain and diarrhea.  Genitourinary: Negative for dysuria and urgency.  Musculoskeletal: Negative for myalgias and neck pain.  Skin: Negative for rash.  Neurological: Negative for dizziness, focal weakness, seizures, weakness and headaches.  Psychiatric/Behavioral: Negative for memory loss. The patient does not have insomnia.     Nutrition:  Tolerating Diet: Tolerating PT:      DRUG ALLERGIES:  No Known Allergies  VITALS:  Blood pressure 130/45, pulse 77, temperature 98.4 F (36.9 C), temperature source Oral, resp. rate 20, height  (1.549 m), weight 55.157 kg (121 lb 9.6 oz), SpO2 94 %.  PHYSICAL EXAMINATION:   Physical Exam  Cardiovascular: Normal rate, regular rhythm and normal heart sounds.   Pulmonary/Chest: No respiratory distress. She has wheezes. She has no rales. She exhibits no tenderness.    GENERAL:  81 y.o.-year-old patient lying in the bed with no acute distress.  EYES: Pupils equal, round, reactive to light and accommodation. No scleral icterus. Extraocular muscles intact.   HEENT: Head atraumatic, normocephalic. Oropharynx and nasopharynx clear.  NECK:  Supple, no jugular venous distention. No thyroid enlargement, no tenderness.  LUNGS: Bilateral expiratory wheeze in all lung fields.  CARDIOVASCULAR: S1, S2 normal. No murmurs, rubs, or gallops.  ABDOMEN: Soft, nontender, nondistended. Bowel sounds present. No organomegaly or mass.  EXTREMITIES: No pedal edema, cyanosis, or clubbing.  NEUROLOGIC: Cranial nerves II through XII are intact. Muscle strength 5/5 in all extremities. Sensation intact. Gait not checked.  PSYCHIATRIC: The patient is alert and oriented x 3.  SKIN: No obvious rash, lesion, or ulcer.    LABORATORY PANEL:   CBC  Recent Labs Lab 07/23/15 0452  WBC 7.3  HGB 7.2*  HCT 23.4*  PLT 220   ------------------------------------------------------------------------------------------------------------------  Chemistries   Recent Labs Lab 07/22/15 2116 07/23/15 0452  NA 142 141  K 4.4 4.9  CL 105 108  CO2 26 22  GLUCOSE 151* 283*  BUN 53* 59*  CREATININE 2.06* 1.96*  CALCIUM 8.9 8.7*  AST 22  --   ALT 13*  --   ALKPHOS 60  --   BILITOT 0.2*  --    ------------------------------------------------------------------------------------------------------------------  Cardiac Enzymes  Recent Labs Lab 07/23/15 0452  TROPONINI <0.03   ------------------------------------------------------------------------------------------------------------------  RADIOLOGY:  Dg Chest 2 View  07/22/2015   CLINICAL DATA:  Cough, congestion, shortness of breath  EXAM: CHEST  2 VIEW  COMPARISON:  CT chest dated 04/07/2015  FINDINGS: Cardiomegaly with mild interstitial edema. Small bilateral pleural effusions. No pneumothorax.  IMPRESSION: Cardiomegaly with mild interstitial edema.  Small bilateral pleural effusions.   Electronically Signed   By: Roselie Awkward.D.  On: 07/22/2015 16:46     ASSESSMENT AND PLAN:   Active Problems:    Dyspnea   Cough   COPD exacerbation   CHF, acute on chronic   Acute bronchitis    #1 COPD exacerbation: Continue on oxygen, steroids,IV antibiotics,, nebulizers. Follow sputum cultures. 2. Recurrent respiratory failure: She is to let us of oxygen at home. #3. Chronic diastolic heart failure: Continue Lasix, metoprolol. #4 acute on chronic renal failure: chronic kidney disease stage III renal function is better. For diabetes mellitus type 2 with diabetic nephropathy: That sugars are elevated secondary to steroids use. Continue insulin with coverage, Glipizide, consult diabetes coordinator. #5 CODE STATUS DO NOT RESUSCITATE.  All the records are reviewed and case discussed with Care Management/Social Workerr. Management plans discussed with the patient, family and they are in agreement.  CODE STATUS:dnr  TOTAL TIME TAKING CARE OF THIS PATIENT: .   POSSIBLE D/C IN 1-2 DAYS, DEPENDING ON CLINICAL CONDITION.   Katha Hamming M.D on 07/23/2015 at 1:13 PM  Between 7am to 6pm - Pager - [phone]  After 6pm go to [email or web address] - password [email or web address] [facility]  [place] [facility]  Office  [phone]  CC: Primary care physician; Marcine Matar, MD

## 2015-07-23 NOTE — Progress Notes (Signed)
Pharmacy Note - Anticoagulation  Patient with orders for enoxaparin 40mg  SQ Q24H for DVT prophylaxis.  Estimated Creatinine Clearance: 15.3 mL/min (by C-G formula based on Cr of 1.96).  Per policy will transition to enoxaparin 30mg  SQ Q24H (for CrCl 15-29ml/min)  Garlon Hatchet, PharmD Clinical Pharmacist  07/23/2015 7:16 AM

## 2015-07-23 NOTE — Progress Notes (Addendum)
Visit made. Patient is followed at home by [facility] with nursing services for wound care to lower extremity wounds. Her Home Health nurse visited on 8/23 and found her to be more short of breath and recommended for patient to be seen at urgent care where she was evaluated and sent to the ED. She has been admitted for treatment of COPD exacerbation. Patient seen lying in bed, appeared to be resting comfortably, respiratory rate even unlabored. Per chart review she is receiving IV steroids and oral antibiotics. She ate 100% and 75% of her meals today. No family present at bedside. Will continue to follow through final disposition. Thank you.  Dayna Barker, RN, BSN, [facility], [facility] [phone] c

## 2015-07-23 NOTE — Progress Notes (Signed)
MEDICATION RELATED CONSULT NOTE - INITIAL   Pharmacy Consult for Renal adjustment of antimicrobials Indication: renal adjustment  No Known Allergies  Patient Measurements: Height: 5\' 1"  (154.9 cm) Weight: 121 lb 9.6 oz (55.157 kg) IBW/kg (Calculated) : 47.8 Adjusted Body Weight:   Vital Signs: Temp: 98.6 F (37 C) (08/24 1313) Temp Source: Oral (08/24 1313) BP: 135/41 mmHg (08/24 1313) Pulse Rate: 84 (08/24 1313) Intake/Output from previous day:   Intake/Output from this shift: Total I/O In: 480 [P.O.:480] Out: -   Labs:  Recent Labs  07/22/15 2116 07/23/15 0452  WBC 9.3 7.3  HGB 7.8* 7.2*  HCT 25.5* 23.4*  PLT 243 220  CREATININE 2.06* 1.96*  ALBUMIN 3.5  --   PROT 7.4  --   AST 22  --   ALT 13*  --   ALKPHOS 60  --   BILITOT 0.2*  --    Estimated Creatinine Clearance: 15.3 mL/min (by C-G formula based on Cr of 1.96).   Microbiology: No results found for this or any previous visit (from the past 720 hour(s)).  Medical History: Past Medical History  Diagnosis Date  . Muscle weakness   . Non-ST elevated myocardial infarction     a. likely demand ischemia 02/2015 in the setting of ARF and acute on chronic CHF  . Humerus fracture     a. left, b. 02/2015; c. in the setting of increased weakness  . Dysphagia   . Anemia   . Paroxysmal a-fib     a. reported a-fib, no documented EKGs showing this  . Chronic systolic CHF (congestive heart failure)     a. echo 02/2015: EF 40-45%, mod dilated LA, mildly dilated RA, mod MR/TR, mildly elevated PASP 43 mm Hg, no evidence of intracardiac shunting, intact interatrial and interventricular septa  . COPD (chronic obstructive pulmonary disease)     a. on home O2  . Hypertensive chronic kidney disease   . Diabetes mellitus without complication   . PVD (peripheral vascular disease)   . Dementia   . Anxiety disorder     Medications:  Scheduled:  . albuterol  2.5 mg Nebulization Q6H  . aspirin EC  81 mg Oral QHS  .  budesonide (PULMICORT) nebulizer solution  0.25 mg Nebulization BID  . enoxaparin (LOVENOX) injection  30 mg Subcutaneous Q24H  . folic acid  1 mg Oral QHS  . furosemide  40 mg Oral Daily  . glipiZIDE  10 mg Oral Daily  . guaiFENesin  600 mg Oral BID  . insulin aspart  0-5 Units Subcutaneous QHS  . insulin aspart  0-9 Units Subcutaneous TID WC  . insulin aspart  4 Units Subcutaneous TID WC  . insulin detemir  10 Units Subcutaneous QHS  . [START ON 07/25/2015] levofloxacin  250 mg Oral Q48H  . methylPREDNISolone (SOLU-MEDROL) injection  60 mg Intravenous Q6H  . metoprolol tartrate  25 mg Oral BID  . nitroGLYCERIN  1 inch Topical 4 times per day  . PARoxetine  20 mg Oral Daily  . potassium chloride SA  20 mEq Oral BID  . psyllium  1 packet Oral Daily  . risperiDONE  0.25 mg Oral QHS  . simvastatin  20 mg Oral QHS  . sodium chloride  3 mL Intravenous Q12H  . tiotropium  18 mcg Inhalation Daily  . vitamin B-12  1,000 mcg Oral Daily    Assessment: Patient in acute on chronic renal failure.     Plan:  Will change to Levofloxacin  250 mg PO q48 hours.   Makhai Fulco D 07/23/2015,1:52 PM

## 2015-07-23 NOTE — Plan of Care (Signed)
Problem: Discharge Progression Outcomes Goal: Other Discharge Outcomes/Goals Outcome: Progressing Pt admitted last night from the ED. Pt memory worsened as the night progressed. Ate 100% in the ED per daughter. Vital signs stable, diastolic pressure slightly low. Per daughter offer toileting q 2-3 hrs to avoid incontinent episodes.  Skin very fragile. Not c/o dyspnea. No sputum to collect for specimen. Pt with non-productive dry cough.

## 2015-07-23 NOTE — Progress Notes (Signed)
Spoke with dr Luberta Mutter about fsbs <400...orders given

## 2015-07-24 DIAGNOSIS — N183 Chronic kidney disease, stage 3 (moderate): Secondary | ICD-10-CM | POA: Diagnosis present

## 2015-07-24 DIAGNOSIS — J9621 Acute and chronic respiratory failure with hypoxia: Secondary | ICD-10-CM | POA: Diagnosis present

## 2015-07-24 DIAGNOSIS — I739 Peripheral vascular disease, unspecified: Secondary | ICD-10-CM | POA: Diagnosis present

## 2015-07-24 DIAGNOSIS — Z79899 Other long term (current) drug therapy: Secondary | ICD-10-CM | POA: Diagnosis not present

## 2015-07-24 DIAGNOSIS — Z7982 Long term (current) use of aspirin: Secondary | ICD-10-CM | POA: Diagnosis not present

## 2015-07-24 DIAGNOSIS — N179 Acute kidney failure, unspecified: Secondary | ICD-10-CM | POA: Diagnosis present

## 2015-07-24 DIAGNOSIS — Z87891 Personal history of nicotine dependence: Secondary | ICD-10-CM | POA: Diagnosis not present

## 2015-07-24 DIAGNOSIS — R9431 Abnormal electrocardiogram [ECG] [EKG]: Secondary | ICD-10-CM | POA: Diagnosis present

## 2015-07-24 DIAGNOSIS — D638 Anemia in other chronic diseases classified elsewhere: Secondary | ICD-10-CM | POA: Diagnosis present

## 2015-07-24 DIAGNOSIS — Z9981 Dependence on supplemental oxygen: Secondary | ICD-10-CM | POA: Diagnosis not present

## 2015-07-24 DIAGNOSIS — I5043 Acute on chronic combined systolic (congestive) and diastolic (congestive) heart failure: Secondary | ICD-10-CM | POA: Diagnosis present

## 2015-07-24 DIAGNOSIS — I129 Hypertensive chronic kidney disease with stage 1 through stage 4 chronic kidney disease, or unspecified chronic kidney disease: Secondary | ICD-10-CM | POA: Diagnosis present

## 2015-07-24 DIAGNOSIS — D509 Iron deficiency anemia, unspecified: Secondary | ICD-10-CM | POA: Diagnosis present

## 2015-07-24 DIAGNOSIS — Z8249 Family history of ischemic heart disease and other diseases of the circulatory system: Secondary | ICD-10-CM | POA: Diagnosis not present

## 2015-07-24 DIAGNOSIS — Z66 Do not resuscitate: Secondary | ICD-10-CM | POA: Diagnosis present

## 2015-07-24 DIAGNOSIS — F0390 Unspecified dementia without behavioral disturbance: Secondary | ICD-10-CM | POA: Diagnosis present

## 2015-07-24 DIAGNOSIS — I252 Old myocardial infarction: Secondary | ICD-10-CM | POA: Diagnosis not present

## 2015-07-24 DIAGNOSIS — J440 Chronic obstructive pulmonary disease with acute lower respiratory infection: Secondary | ICD-10-CM | POA: Diagnosis present

## 2015-07-24 DIAGNOSIS — J441 Chronic obstructive pulmonary disease with (acute) exacerbation: Secondary | ICD-10-CM | POA: Diagnosis present

## 2015-07-24 DIAGNOSIS — J209 Acute bronchitis, unspecified: Secondary | ICD-10-CM | POA: Diagnosis present

## 2015-07-24 DIAGNOSIS — I480 Paroxysmal atrial fibrillation: Secondary | ICD-10-CM | POA: Diagnosis present

## 2015-07-24 DIAGNOSIS — E1121 Type 2 diabetes mellitus with diabetic nephropathy: Secondary | ICD-10-CM | POA: Diagnosis present

## 2015-07-24 DIAGNOSIS — Z79891 Long term (current) use of opiate analgesic: Secondary | ICD-10-CM | POA: Diagnosis not present

## 2015-07-24 DIAGNOSIS — T380X5A Adverse effect of glucocorticoids and synthetic analogues, initial encounter: Secondary | ICD-10-CM | POA: Diagnosis present

## 2015-07-24 LAB — ABO/RH: ABO/RH(D): O POS

## 2015-07-24 LAB — CREATININE, SERUM
CREATININE: 2.5 mg/dL — AB (ref 0.44–1.00)
GFR calc Af Amer: 19 mL/min — ABNORMAL LOW (ref >60)
GFR, EST NON AFRICAN AMERICAN: 16 mL/min — AB (ref >60)

## 2015-07-24 LAB — GLUCOSE, CAPILLARY
GLUCOSE-CAPILLARY: 219 mg/dL — AB (ref 65–99)
Glucose-Capillary: 169 mg/dL — ABNORMAL HIGH (ref 65–99)
Glucose-Capillary: 169 mg/dL — ABNORMAL HIGH (ref 65–99)
Glucose-Capillary: 281 mg/dL — ABNORMAL HIGH (ref 65–99)

## 2015-07-24 LAB — HEMOGLOBIN: HEMOGLOBIN: 7 g/dL — AB (ref 12.0–16.0)

## 2015-07-24 LAB — PREPARE RBC (CROSSMATCH)

## 2015-07-24 MED ORDER — HEPARIN SODIUM (PORCINE) 5000 UNIT/ML IJ SOLN
5000.0000 [IU] | Freq: Two times a day (BID) | INTRAMUSCULAR | Status: DC
  Administered 2015-07-24 – 2015-07-25 (×2): 5000 [IU] via SUBCUTANEOUS
  Filled 2015-07-24 (×2): qty 1

## 2015-07-24 MED ORDER — ALBUTEROL SULFATE (2.5 MG/3ML) 0.083% IN NEBU
2.5000 mg | INHALATION_SOLUTION | Freq: Three times a day (TID) | RESPIRATORY_TRACT | Status: DC
  Administered 2015-07-24 – 2015-07-25 (×4): 2.5 mg via RESPIRATORY_TRACT
  Filled 2015-07-24 (×6): qty 3

## 2015-07-24 MED ORDER — SODIUM CHLORIDE 0.9 % IV SOLN
Freq: Once | INTRAVENOUS | Status: AC
  Administered 2015-07-24: 18:00:00 via INTRAVENOUS

## 2015-07-24 NOTE — Plan of Care (Signed)
Picked up pt care at 15:00.  Pt receiving 1 unit of PRBC b/c Hgb steadily dropped to 7.0 today - no known source of bleeding.  Need occult stool speciman.  Pt is alert but disoriented.  No s/sx of pain.  Has good appetite. Possible d/c tomorrow if Hgb improves - will be checked after blood is finished running.

## 2015-07-24 NOTE — Progress Notes (Signed)
PT Cancellation Note  Patient Details Name: Laura Jefferson MRN: [id number] DOB: 31-Mar-1925   Cancelled Treatment:    Reason Eval/Treat Not Completed: Medical issues which prohibited therapy. Patient's daughter asks PT several questions and seems hesitant about any potential SNF recommendation from PT. Midway through conversation MD entered patient's room to discuss iron supplementation, transfusion, etc. To normalize her hemoglobin levels. PT will defer mobility evaluation at this time until patient is more medically ready and family is able to decide if they are interested in skilled PT services.   Kerin Ransom, PT, DPT    07/24/2015, 1:51 PM

## 2015-07-24 NOTE — Progress Notes (Signed)
Visit made. Patient seen sitting up in bed, alert, feeding herself lunch, daughter Kenney Houseman at bedside. Kenney Houseman had several questions  regarding patient's lab results, antibiotics and IV fluids. Staff RN Lillia Abed also present, and answered questions. Tanya wished to speak with attending Physician Dr. Mosetta Anis to page her. Possible discharge to home today on hold due t low Hb of 7 this morning. Patient was able to ambulate to the bathroom with assistance prior to this writer's visit. Her daughter reports at baseline patient ambulates with a walker, she expressed concerns over "what she can still do now". She would like for her mother to up to the chair, RN Lillia Abed aware.  Will continue to monitor through final disposition. Thank you. Dayna Barker RN, BSN, [facility], [facility] [phone] c

## 2015-07-24 NOTE — Clinical Documentation Improvement (Signed)
Internal Medicine  Can the diagnosis of Respiratory Failure be further specified?   Document Acuity - Acute, Chronic, Acute on Chronic  Document Inclusion Of - Hypoxia, Hypercapnia, Combination of Both  Other  Clinically Undetermined  Supporting Information: ED note: Family increase her oxygen level to 1 L during the day, when she used to not usually uses it during the day. PMH: COPD on home O2.  Progress note 07/23/15: "2. Recurrent respiratory failure: She is to let us of oxygen at home." Please exercise your independent, professional judgment when responding. A specific answer is not anticipated or expected.   Thank You,  Andy Gauss, RN, BSN, CDI [phone] Health Information Management [place]

## 2015-07-24 NOTE — Plan of Care (Signed)
Problem: Discharge Progression Outcomes Goal: Other Discharge Outcomes/Goals Outcome: Progressing Plan of care progress to goal: Patient admitted on 07/22/2015 with Ekg abnormalities, pulmonary edema with congestive heart failure, and chest pain.  Patient is on 02 via [place], chronic 02, shortness of breath on exertion, v/s stable.  Patient's hgb dropped to 7.2 from 7.8, pale in color Patient with dementia, pleasantly confused, forgetful Patient is on iv steroids and po abt's. Patient swallows medications without difficulty.  Patient up to bathroom with 1 assist. Patient legs elevated on pillows,  Patient has difficulty sleeping, risperdal and valium ordered, she does deny pain,

## 2015-07-24 NOTE — Progress Notes (Signed)
Inpatient Diabetes Program Recommendations  AACE/ADA: New Consensus Statement on Inpatient Glycemic Control (2013)  Target Ranges:  Prepandial:   less than 140 mg/dL      Peak postprandial:   less than 180 mg/dL (1-2 hours)      Critically ill patients:  140 - 180 mg/dL   Reason for review: elevated blood sugars yesterday  Diabetes history: Type 2 Outpatient Diabetes medications: Glucophage 500mg  qam, 1000mg  qpm, Glucotrol 10mg /day  Current orders for Inpatient glycemic control: Glipizide 10mg /day, Levemir 10 units qhs, Novolog 0-9 units tid, Novolog 0-5 units qhs, Novolog 4 units tid with meals  Agree with current orders for diabetes medications.  Insulin will need to be decreased as steroids are tapered. A1C 6.2% on 07/22/15  Susette Racer, RN, [place], [place], CDE Diabetes Coordinator Inpatient Diabetes Program  [phone] (Team Pager) [phone] [facility] Office) 07/24/2015 7:03 AM

## 2015-07-24 NOTE — Plan of Care (Signed)
Problem: Discharge Progression Outcomes Goal: Hemodynamically stable Outcome: Progressing HBG checked pt went from 7.2 to 7.0 Dr. Winona Legato notified, awaiting orders for possible transfusion

## 2015-07-24 NOTE — Clinical Documentation Improvement (Signed)
Internal Medicine  Can the diagnosis of CHF be further specified?    Acuity - Acute, Chronic, Acute on Chronic   Type - Systolic, Diastolic, Systolic and Diastolic  Other  Clinically Undetermined  Please clarify.  Supporting Information: Progress note: "CHF, acute on chronic" and "#3. Chronic diastolic heart failure: Hold Lasix "  Component     Latest Ref Rng 07/22/2015 07/22/2015         5:13 PM  9:16 PM  B Natriuretic Peptide     0.0 - 100.0 pg/mL 4128.0 (H) 3897.0 (H)    Please exercise your independent, professional judgment when responding. A specific answer is not anticipated or expected.   Thank You,  Andy Gauss, RN, BSN, CDI [phone] Health Information Management [facility]

## 2015-07-24 NOTE — Progress Notes (Signed)
Pharmacy Note - Anticoagulation  Patient with orders for enoxaparin 30mg  SQ Q24H for DVT prophylaxis.  Estimated Creatinine Clearance: 12 mL/min   Per policy will transition to heparin 5000 units Taylor q 12 hours  Gaila Engebretsen D Kuron Docken, Pharm.D Clinical Pharmacist 07/24/2015

## 2015-07-24 NOTE — Progress Notes (Signed)
PT Cancellation Note  Patient Details Name: FRANKA MCDANNEL MRN: [id number] DOB: June 24, 1925   Cancelled Treatment:    Reason Eval/Treat Not Completed: Medical issues which prohibited therapy. Patient's daughter comes up to PT and CM in the hallway to further inquire regarding what a mobility evaluation and PT recommendations involve. Patient's daughter is very concerned regarding the bill generated from a PT evaluation, PT and CM attempted to explain DRG packing though patient appears to have had a different set of past experiences. At this time patient's daughter is asking to attempt her own mobility evaluation with the intent of asking PT to come in only if she deemed necessary. PT and CM explained to her that she will need at least the RN staff supervising for this and that this is not recommended. Patient is set to receive blood transfusion today, PT informed daughter with this mobility evaluation will be deferred anyways. She agreed to have a discussion with PT and CM tomorrow.   Kerin Ransom, PT, DPT    07/24/2015, 2:36 PM

## 2015-07-24 NOTE — Care Management (Signed)
Patient admitted with COPD exacerbation due to acute bronchitis.  Patient lives at home with daughter.  Patient uses [employer], as well as [employer] on [employer].  Patient has a walker, BSC, hospital bed, and shower chair at home.  Patient chronically uses 2 Liters of O2 at home supplied by [employer].  Patient has home health nursing thorough [facility].  Patients daughter is refusing PT consult this time.  Patient's daughter states that she doesn't forsee any additional home needs.  RNCM to follow for discharge planning.

## 2015-07-24 NOTE — Progress Notes (Signed)
[facility] [place] at [facility]   PATIENT NAME: Laura Jefferson    MR#:  [id number]  DATE OF BIRTH:  [birth year]  SUBJECTIVE:  admitted for COPD, CHF exacerbation. Patient mainly has COPD flare than CHF. BNP is elevated but patient  symptoms are consistent with severe COPD exacerbation. Having lots of cough and spitting up thick green phlegm.   CHIEF COMPLAINT:   Chief Complaint  Patient presents with  . Shortness of Breath  feels satisfactory, less cough, still sob, especially on exertion  REVIEW OF SYSTEMS:    Review of Systems  Constitutional: Negative for fever and chills.  HENT: Negative for hearing loss.   Eyes: Negative for blurred vision, double vision and photophobia.  Respiratory: Negative for cough, hemoptysis, sputum production, shortness of breath and wheezing.   Cardiovascular: Negative for palpitations, orthopnea and leg swelling.  Gastrointestinal: Negative for vomiting, abdominal pain and diarrhea.  Genitourinary: Negative for dysuria and urgency.  Musculoskeletal: Negative for myalgias and neck pain.  Skin: Negative for rash.  Neurological: Negative for dizziness, focal weakness, seizures, weakness and headaches.  Psychiatric/Behavioral: Negative for memory loss. The patient does not have insomnia.     Nutrition:  Tolerating Diet: Tolerating PT:      DRUG ALLERGIES:  No Known Allergies  VITALS:  Blood pressure 110/62, pulse 70, temperature 97.9 F (36.6 C), temperature source Oral, resp. rate 19, height  (1.549 m), weight 51.438 kg (113 lb 6.4 oz), SpO2 100 %.  PHYSICAL EXAMINATION:   Physical Exam  Cardiovascular: Normal rate, regular rhythm and normal heart sounds.   Pulmonary/Chest: No respiratory distress. She has no wheezes. She has no rales. She exhibits no tenderness.    GENERAL:  81 y.o.-year-old patient lying in the bed with no acute distress. Somnolent today early in the morning, however, alert and  comfortable later in the day EYES: Pupils equal, round, reactive to light and accommodation. No scleral icterus. Extraocular muscles intact.  HEENT: Head atraumatic, normocephalic. Oropharynx and nasopharynx clear.  NECK:  Supple, no jugular venous distention. No thyroid enlargement, no tenderness.  LUNGS: Bilateral expiratory wheeze in all lung fields.  CARDIOVASCULAR: S1, S2 normal. No murmurs, rubs, or gallops.  ABDOMEN: Soft, nontender, nondistended. Bowel sounds present. No organomegaly or mass.  EXTREMITIES: No pedal edema, cyanosis, or clubbing.  NEUROLOGIC: Cranial nerves II through XII are intact. Muscle strength 5/5 in all extremities. Sensation intact. Gait not checked.  PSYCHIATRIC: The patient is alert and oriented x 3.  SKIN: No obvious rash, lesion, or ulcer.    LABORATORY PANEL:   CBC  Recent Labs Lab 07/23/15 0452 07/24/15 1118  WBC 7.3  --   HGB 7.2* 7.0*  HCT 23.4*  --   PLT 220  --    ------------------------------------------------------------------------------------------------------------------  Chemistries   Recent Labs Lab 07/22/15 2116 07/23/15 0452 07/24/15 0515  NA 142 141  --   K 4.4 4.9  --   CL 105 108  --   CO2 26 22  --   GLUCOSE 151* 283*  --   BUN 53* 59*  --   CREATININE 2.06* 1.96* 2.50*  CALCIUM 8.9 8.7*  --   AST 22  --   --   ALT 13*  --   --   ALKPHOS 60  --   --   BILITOT 0.2*  --   --    ------------------------------------------------------------------------------------------------------------------  Cardiac Enzymes  Recent Labs Lab 07/23/15 0452  TROPONINI <0.03   ------------------------------------------------------------------------------------------------------------------  RADIOLOGY:  Dg Chest 2 View  07/22/2015   CLINICAL DATA:  Cough, congestion, shortness of breath  EXAM: CHEST  2 VIEW  COMPARISON:  CT chest dated 04/07/2015  FINDINGS: Cardiomegaly with mild interstitial edema. Small bilateral pleural  effusions. No pneumothorax.  IMPRESSION: Cardiomegaly with mild interstitial edema.  Small bilateral pleural effusions.   Electronically Signed   By: Charline Bills M.D.   On: 07/22/2015 16:46     ASSESSMENT AND PLAN:   Active Problems:   Dyspnea   COPD exacerbation   CHF, acute on chronic   Cough   Acute bronchitis    #1 COPD exacerbation: Continue on oxygen, steroids, IV antibiotics,, nebulizers. sputum cultures are not reported. 2. Recurrent respiratory failure: She is on  oxygen at home, continue oxygenation per nasal cannulas. O2 sats are good. #3. Chronic diastolic heart failure: Hold Lasix due to acute on chronic renal failure, continue metoprolol. #4 acute on chronic renal failure: chronic kidney disease stage III renal function is worse today, follow tomorrow when patient is off Lasix for 24 hours. 5. diabetes mellitus type 2 with diabetic nephropathy: That sugars are elevated secondary to steroids use. Continue insulin with coverage, Glipizide, consult diabetes coordinator. #6 CODE STATUS DO NOT RESUSCITATE.  7. Anemia, apparent deficiency as well as anemia of chronic disease. Transfuse packed red blood cells, 1 unit due to shortness of breath, hypoxia and significant weakness, get occult blood of stool. Discussed this patient's daughter for probably additional 30 minutes, all questions were answered ,  she voiced understanding, follow hemoglobin level tomorrow morning with likely discharge back home tomorrow All the records are reviewed and case discussed with Care Management/Social Workerr. Management plans discussed with the patient, family and they are in agreement.  CODE STATUS:dnr  TOTAL TIME TAKING CARE OF THIS PATIENT: 50 minutes.    Discussed this patient's daughter for probably additional 30 minutes, all questions were answered ,  she voiced understanding, follow hemoglobin level tomorrow morning with likely discharge back home tomorrow   Ilona Colley M.D on  07/24/2015 at 2:17 PM  Between 7am to 6pm - Pager - [phone]  After 6pm go to [email or web address] - password [email or web address] [facility]  [facility]  Office  [phone]  CC: Primary care physician; Marcine Matar, MD

## 2015-07-25 DIAGNOSIS — N179 Acute kidney failure, unspecified: Secondary | ICD-10-CM

## 2015-07-25 DIAGNOSIS — N189 Chronic kidney disease, unspecified: Secondary | ICD-10-CM

## 2015-07-25 DIAGNOSIS — J9621 Acute and chronic respiratory failure with hypoxia: Secondary | ICD-10-CM

## 2015-07-25 DIAGNOSIS — D509 Iron deficiency anemia, unspecified: Secondary | ICD-10-CM | POA: Insufficient documentation

## 2015-07-25 LAB — BASIC METABOLIC PANEL
ANION GAP: 9 (ref 5–15)
Anion gap: 9 (ref 5–15)
BUN: 72 mg/dL — ABNORMAL HIGH (ref 6–20)
BUN: 68 mg/dL — ABNORMAL HIGH (ref 6–20)
CALCIUM: 8.2 mg/dL — AB (ref 8.9–10.3)
CHLORIDE: 108 mmol/L (ref 101–111)
CO2: 23 mmol/L (ref 22–32)
CO2: 21 mmol/L — ABNORMAL LOW (ref 22–32)
CREATININE: 2.43 mg/dL — AB (ref 0.44–1.00)
CREATININE: 2.31 mg/dL — AB (ref 0.44–1.00)
Calcium: 8.4 mg/dL — ABNORMAL LOW (ref 8.9–10.3)
Chloride: 110 mmol/L (ref 101–111)
GFR calc non Af Amer: 17 mL/min — ABNORMAL LOW (ref >60)
GFR, EST AFRICAN AMERICAN: 19 mL/min — AB (ref >60)
GFR, EST AFRICAN AMERICAN: 21 mL/min — AB (ref >60)
GFR, EST NON AFRICAN AMERICAN: 18 mL/min — AB (ref >60)
Glucose, Bld: 222 mg/dL — ABNORMAL HIGH (ref 65–99)
Glucose, Bld: 232 mg/dL — ABNORMAL HIGH (ref 65–99)
POTASSIUM: 4.5 mmol/L (ref 3.5–5.1)
Potassium: 4.8 mmol/L (ref 3.5–5.1)
SODIUM: 140 mmol/L (ref 135–145)
Sodium: 140 mmol/L (ref 135–145)

## 2015-07-25 LAB — HEMOGLOBIN AND HEMATOCRIT, BLOOD
HEMATOCRIT: 26.7 % — AB (ref 35.0–47.0)
Hemoglobin: 8.3 g/dL — ABNORMAL LOW (ref 12.0–16.0)

## 2015-07-25 LAB — TYPE AND SCREEN
ABO/RH(D): O POS
Antibody Screen: NEGATIVE
Unit division: 0

## 2015-07-25 LAB — CBC
HCT: 27.3 % — ABNORMAL LOW (ref 35.0–47.0)
HEMATOCRIT: 26.3 % — AB (ref 35.0–47.0)
HEMOGLOBIN: 8.4 g/dL — AB (ref 12.0–16.0)
HEMOGLOBIN: 8.6 g/dL — AB (ref 12.0–16.0)
MCH: 26 pg (ref 26.0–34.0)
MCH: 25.3 pg — ABNORMAL LOW (ref 26.0–34.0)
MCHC: 32 g/dL (ref 32.0–36.0)
MCHC: 31.6 g/dL — AB (ref 32.0–36.0)
MCV: 81.2 fL (ref 80.0–100.0)
MCV: 80.1 fL (ref 80.0–100.0)
PLATELETS: 206 10*3/uL (ref 150–440)
Platelets: 203 10*3/uL (ref 150–440)
RBC: 3.24 MIL/uL — AB (ref 3.80–5.20)
RBC: 3.41 MIL/uL — ABNORMAL LOW (ref 3.80–5.20)
RDW: 17.5 % — ABNORMAL HIGH (ref 11.5–14.5)
RDW: 17.2 % — AB (ref 11.5–14.5)
WBC: 12.4 10*3/uL — ABNORMAL HIGH (ref 3.6–11.0)
WBC: 13.2 10*3/uL — ABNORMAL HIGH (ref 3.6–11.0)

## 2015-07-25 LAB — GLUCOSE, CAPILLARY
GLUCOSE-CAPILLARY: 210 mg/dL — AB (ref 65–99)
GLUCOSE-CAPILLARY: 216 mg/dL — AB (ref 65–99)

## 2015-07-25 MED ORDER — LEVOFLOXACIN 250 MG PO TABS: 250.0000 mg | ORAL_TABLET | ORAL | Status: DC

## 2015-07-25 MED ORDER — GUAIFENESIN ER 600 MG PO TB12: 600.0000 mg | ORAL_TABLET | Freq: Two times a day (BID) | ORAL | Status: DC

## 2015-07-25 MED ORDER — ALBUTEROL SULFATE (2.5 MG/3ML) 0.083% IN NEBU: 2.5000 mg | INHALATION_SOLUTION | RESPIRATORY_TRACT | Status: DC | PRN

## 2015-07-25 MED ORDER — METHYLPREDNISOLONE 4 MG PO TBPK
ORAL_TABLET | ORAL | Status: DC
Start: 2015-07-25 — End: 2015-12-30

## 2015-07-25 MED ORDER — INSULIN DETEMIR 100 UNIT/ML ~~LOC~~ SOLN: 10.0000 [IU] | Freq: Every day | SUBCUTANEOUS | Status: DC

## 2015-07-25 MED ORDER — INSULIN ASPART 100 UNIT/ML ~~LOC~~ SOLN: 4.0000 [IU] | Freq: Three times a day (TID) | SUBCUTANEOUS | Status: DC

## 2015-07-25 MED ORDER — INSULIN ASPART 100 UNIT/ML ~~LOC~~ SOLN: 0.0000 [IU] | Freq: Three times a day (TID) | SUBCUTANEOUS | Status: DC

## 2015-07-25 NOTE — Progress Notes (Signed)
MD making rounds. Discharge orders received. IV removed. Prescriptions E-Scripted to Pharmacy. Discharge paperwork provided, explained, signed and witnessed. No unanswered questions. Escorted via wheelchair by auxiliary staff. All belongings sent with patient and family. 

## 2015-07-25 NOTE — Care Management (Signed)
Patient to be discharged with home health.  PT and RN order wrote for.  Discussed home PT with daughter, and she is refusing home health PT.  She is open to home health RN coming to the home.  Clydie Braun with [facility] notified of discharged.  Resumption of care order placed.  RN CM signing off

## 2015-07-25 NOTE — Progress Notes (Signed)
MD making rounds. Discharge orders received. IV removed. Prescriptions E-Scripted to Pharmacy. Discharge paperwork provided, explained, signed and witnessed. No unanswered questions. Escorted via wheelchair by Scientist, research (physical sciences). All belongings sent with patient and family.

## 2015-07-25 NOTE — Progress Notes (Signed)
PT Cancellation Note  Patient Details Name: Laura Jefferson MRN: [id number] DOB: 1925/05/15   Cancelled Treatment:    Reason Eval/Treat Not Completed: Patient declined, no reason specified (Evaluation re-attempted.  Patient unsure of need/interest in PT evaluation.  Requests therapist return when daughter is present to discuss need/benefit of PT eval (per previous records, daughter refusing PT).  Will re-attempt later time this date as able)   Marquavis Hannen H. Manson Passey, PT, DPT, NCS 07/25/2015, 10:59 AM [phone]

## 2015-07-25 NOTE — Discharge Summary (Signed)
[facility] - [place] at [facility]   PATIENT NAME: Laura Jefferson    MR#:  [id number]  DATE OF BIRTH:  1925/10/18  DATE OF ADMISSION:  07/22/2015 ADMITTING PHYSICIAN: Katharina Caper, MD  DATE OF DISCHARGE: 07/25/2015  1:05 PM  PRIMARY CARE PHYSICIAN: Marcine Matar, MD     ADMISSION DIAGNOSIS:  EKG abnormalities [R94.31] Pulmonary edema with congestive heart failure [I50.1] Chest pain, moderate coronary artery risk [R07.9]  DISCHARGE DIAGNOSIS:  Principal Problem:   Acute on chronic respiratory failure with hypoxia Active Problems:   Dyspnea   COPD exacerbation   Cough   CHF, acute on chronic   Acute bronchitis   Acute on chronic renal failure   Anemia, iron deficiency   SECONDARY DIAGNOSIS:   Past Medical History  Diagnosis Date  . Muscle weakness   . Non-ST elevated myocardial infarction     a. likely demand ischemia 02/2015 in the setting of ARF and acute on chronic CHF  . Humerus fracture     a. left, b. 02/2015; c. in the setting of increased weakness  . Dysphagia   . Anemia   . Paroxysmal a-fib     a. reported a-fib, no documented EKGs showing this  . Chronic systolic CHF (congestive heart failure)     a. echo 02/2015: EF 40-45%, mod dilated LA, mildly dilated RA, mod MR/TR, mildly elevated PASP 43 mm Hg, no evidence of intracardiac shunting, intact interatrial and interventricular septa  . COPD (chronic obstructive pulmonary disease)     a. on home O2  . Hypertensive chronic kidney disease   . Diabetes mellitus without complication   . PVD (peripheral vascular disease)   . Dementia   . Anxiety disorder     .pro HOSPITAL COURSE:  The patient is 81 year old female with past medical history significant significant for history of long-standing tobacco abuse who quit 4 months ago. History of dementia, COPD, CHF who presents to the hospital with cough, chest congestion, wheezing, sputum production and shortness of breath, on  arrival to emergency room, her chest x-ray revealed cardiomegaly and mild interstitial edema as well as small bilateral pleural effusions. Patient was admitted to the hospital for COPD exacerbation, acute bronchitis and CHF exacerbation. She was initiated on antibiotics, steroids, inhalation therapy and diuretic IV. With this dual therapy her condition improved and she was stable to be discharged home with home health RN . While in the hospital and her hemoglobin level was found to be low at 7.0 on 07/24/2015, after which patient transfused 1 unit of packed red blood cells. This transfusion, patient's hemoglobin level was 8.6 and remained stable. On further evaluation, lab studies done approximately a month ago showed iron deficiency anemia.  Discussion by problem #1 COPD exacerbation: Continue home oxygen therapy, steroid taper,  levofloxacin to complete antibiotic course,, nebulizers. sputum cultures were not reported. Blood cultures were not taken. Patient's condition improved and she is ready to be discharged home with home health follow-up. Discussed with care management 2. Recurrent respiratory failure: She is on oxygen at home, continue oxygenation per nasal cannulas. O2 sats are good on 2 L of oxygen through nasal cannula. #3. Acute on Chronic diastolic heart failure: Resume Lasix at home doses, continue metoprolol. #4 acute on chronic renal failure: Patient's Lasix was suspended and patient was transfused packed red blood cells, her creatinine, however, did not improve significantly, just slightly, patient was recommended to follow-up with primary care physician and follow kidney function as outpatient.  She would benefit from nephrology as outpatient follow-up as well  5. diabetes mellitus type 2 with diabetic nephropathy: Blood glucose levels are elevated secondary to steroids use. Continue insulin with coverage, Glipizide, appreciate diabetes coordinator input. Patient's Levemir may need to be  decreased since patient's hemoglobin A1c came back at 6.2. #6 CODE STATUS DO NOT RESUSCITATE.  7. Anemia, iron deficiency as well as anemia of chronic disease. Status post 1 unit of packed red blood cell transfusion. Hemoglobin level is stable. No bleeding noted. Discussed this patient's family about possible gastroenterology follow-up as outpatient  DISCHARGE CONDITIONS:   Stable  CONSULTS OBTAINED:     DRUG ALLERGIES:  No Known Allergies  DISCHARGE MEDICATIONS:   Discharge Medication List as of 07/25/2015 12:34 PM    START taking these medications   Details  albuterol (PROVENTIL) (2.5 MG/3ML) 0.083% nebulizer solution Take 3 mLs (2.5 mg total) by nebulization every 4 (four) hours as needed for wheezing or shortness of breath., Starting 07/25/2015, Until Discontinued, Normal    guaiFENesin (MUCINEX) 600 MG 12 hr tablet Take 1 tablet (600 mg total) by mouth 2 (two) times daily., Starting 07/25/2015, Until Discontinued, Normal    !! insulin aspart (NOVOLOG) 100 UNIT/ML injection Inject 0-9 Units into the skin 3 (three) times daily with meals., Starting 07/25/2015, Until Discontinued, Normal    !! insulin aspart (NOVOLOG) 100 UNIT/ML injection Inject 4 Units into the skin 3 (three) times daily with meals., Starting 07/25/2015, Until Discontinued, Normal    insulin detemir (LEVEMIR) 100 UNIT/ML injection Inject 0.1 mLs (10 Units total) into the skin at bedtime., Starting 07/25/2015, Until Discontinued, Normal    levofloxacin (LEVAQUIN) 250 MG tablet Take 1 tablet (250 mg total) by mouth every other day., Starting 07/25/2015, Until Discontinued, Normal    methylPREDNISolone (MEDROL DOSEPAK) 4 MG TBPK tablet follow package directions, Normal     !! - Potential duplicate medications found. Please discuss with provider.    CONTINUE these medications which have NOT CHANGED   Details  albuterol (PROVENTIL HFA;VENTOLIN HFA) 108 (90 BASE) MCG/ACT inhaler Inhale 1 puff into the lungs every 6 (six)  hours as needed for wheezing or shortness of breath., Until Discontinued, Historical Med    aspirin EC 81 MG tablet Take 81 mg by mouth at bedtime., Until Discontinued, Historical Med    Cholecalciferol (VITAMIN D) 2000 UNITS tablet Take 2,000 Units by mouth daily., Until Discontinued, Historical Med    diazepam (VALIUM) 10 MG tablet Take 10 mg by mouth daily as needed for anxiety., Until Discontinued, Historical Med    docusate sodium (COLACE) 100 MG capsule Take 100-200 mg by mouth 2 (two) times daily as needed for mild constipation., Until Discontinued, Historical Med    folic acid (FOLVITE) 1 MG tablet Take 1 mg by mouth at bedtime., Until Discontinued, Historical Med    furosemide (LASIX) 40 MG tablet Take 40 mg by mouth daily. , Until Discontinued, Historical Med    glipiZIDE (GLUCOTROL XL) 10 MG 24 hr tablet Take 10 mg by mouth daily., Until Discontinued, Historical Med    HYDROcodone-acetaminophen (NORCO/VICODIN) 5-325 MG per tablet Take 1 tablet by mouth every 6 (six) hours as needed for moderate pain., Until Discontinued, Historical Med    metoprolol tartrate (LOPRESSOR) 25 MG tablet Take 25 mg by mouth 2 (two) times daily., Until Discontinued, Historical Med    PARoxetine (PAXIL) 20 MG tablet Take 20 mg by mouth daily., Until Discontinued, Historical Med    psyllium (METAMUCIL) 58.6 % packet Take  1 packet by mouth daily as needed (for constipation)., Until Discontinued, Historical Med    risperiDONE (RISPERDAL) 0.5 MG tablet Take 0.25 mg by mouth at bedtime., Until Discontinued, Historical Med    simvastatin (ZOCOR) 20 MG tablet Take 20 mg by mouth at bedtime. , Until Discontinued, Historical Med    Tiotropium Bromide Monohydrate 2.5 MCG/ACT AERS Inhale 2 puffs into the lungs daily. , Until Discontinued, Historical Med    vitamin B-12 (CYANOCOBALAMIN) 1000 MCG tablet Take 1,000 mcg by mouth daily., Until Discontinued, Historical Med      STOP taking these medications      metFORMIN (GLUCOPHAGE) 500 MG tablet      potassium chloride SA (K-DUR,KLOR-CON) 20 MEQ tablet          DISCHARGE INSTRUCTIONS:    Patient is to follow-up with primary care physician, Dr. Burnett Sheng and nephrologist as outpatient  If you experience worsening of your admission symptoms, develop shortness of breath, life threatening emergency, suicidal or homicidal thoughts you must seek medical attention immediately by calling 911 or calling your MD immediately  if symptoms less severe.  You Must read complete instructions/literature along with all the possible adverse reactions/side effects for all the Medicines you take and that have been prescribed to you. Take any new Medicines after you have completely understood and accept all the possible adverse reactions/side effects.   Please note  You were cared for by a hospitalist during your hospital stay. If you have any questions about your discharge medications or the care you received while you were in the hospital after you are discharged, you can call the unit and asked to speak with the hospitalist on call if the hospitalist that took care of you is not available. Once you are discharged, your primary care physician will handle any further medical issues. Please note that NO REFILLS for any discharge medications will be authorized once you are discharged, as it is imperative that you return to your primary care physician (or establish a relationship with a primary care physician if you do not have one) for your aftercare needs so that they can reassess your need for medications and monitor your lab values.    Today   CHIEF COMPLAINT:   Chief Complaint  Patient presents with  . Shortness of Breath    HISTORY OF PRESENT ILLNESS:  Laura Jefferson  is a 81 y.o. female with a known history of long-standing tobacco abuse who quit 4 months ago. History of dementia, COPD, CHF who presents to the hospital with cough, chest congestion, wheezing,  sputum production and shortness of breath, on arrival to emergency room, her chest x-ray revealed cardiomegaly and mild interstitial edema as well as small bilateral pleural effusions. Patient was admitted to the hospital for COPD exacerbation, acute bronchitis and CHF exacerbation. She was initiated on antibiotics, steroids, inhalation therapy and diuretic IV. With this dual therapy her condition improved and she was stable to be discharged home with home health RN . While in the hospital and her hemoglobin level was found to be low at 7.0 on 07/24/2015, after which patient transfused 1 unit of packed red blood cells. This transfusion, patient's hemoglobin level was 8.6 and remained stable. On further evaluation, lab studies done approximately a month ago showed iron deficiency anemia.  Discussion by problem #1 COPD exacerbation: Continue home oxygen therapy, steroid taper,  levofloxacin to complete antibiotic course,, nebulizers. sputum cultures were not reported. Blood cultures were not taken. Patient's condition improved and she is  ready to be discharged home with home health follow-up. Discussed with care management 2. Recurrent respiratory failure: She is on oxygen at home, continue oxygenation per nasal cannulas. O2 sats are good on 2 L of oxygen through nasal cannula. #3. Acute on Chronic diastolic heart failure: Resume Lasix at home doses, continue metoprolol. #4 acute on chronic renal failure: Patient's Lasix was suspended and patient was transfused packed red blood cells, her creatinine, however, did not improve significantly, just slightly, patient was recommended to follow-up with primary care physician and follow kidney function as outpatient. She would benefit from nephrology as outpatient follow-up as well  5. diabetes mellitus type 2 with diabetic nephropathy: Blood glucose levels are elevated secondary to steroids use. Continue insulin with coverage, Glipizide, appreciate diabetes  coordinator input. Patient's Levemir may need to be decreased since patient's hemoglobin A1c came back at 6.2. #6 CODE STATUS DO NOT RESUSCITATE.  7. Anemia, iron deficiency as well as anemia of chronic disease. Status post 1 unit of packed red blood cell transfusion. Hemoglobin level is stable. No bleeding noted. Discussed this patient's family about possible gastroenterology follow-up as outpatient  DISCHARGE CONDITIONS:     VITAL SIGNS:  Blood pressure 147/55, pulse 71, temperature 97.8 F (36.6 C), temperature source Oral, resp. rate 19, height 5\' 1"  (1.549 m), weight 51.438 kg (113 lb 6.4 oz), SpO2 97 %.  I/O:   Intake/Output Summary (Last 24 hours) at 07/25/15 1608 Last data filed at 07/25/15 0800  Gross per 24 hour  Intake    313 ml  Output      0 ml  Net    313 ml    PHYSICAL EXAMINATION:  GENERAL:  81 y.o.-year-old patient lying in the bed with no acute distress.  EYES: Pupils equal, round, reactive to light and accommodation. No scleral icterus. Extraocular muscles intact.  HEENT: Head atraumatic, normocephalic. Oropharynx and nasopharynx clear.  NECK:  Supple, no jugular venous distention. No thyroid enlargement, no tenderness.  LUNGS: Normal breath sounds bilaterally, no wheezing, rales,rhonchi or crepitation. No use of accessory muscles of respiration.  CARDIOVASCULAR: S1, S2 normal. No murmurs, rubs, or gallops.  ABDOMEN: Soft, non-tender, non-distended. Bowel sounds present. No organomegaly or mass.  EXTREMITIES: No pedal edema, cyanosis, or clubbing.  NEUROLOGIC: Cranial nerves II through XII are intact. Muscle strength 5/5 in all extremities. Sensation intact. Gait not checked.  PSYCHIATRIC: The patient is alert and oriented x 3.  SKIN: No obvious rash, lesion, or ulcer.   DATA REVIEW:   CBC  Recent Labs Lab 07/25/15 0947  WBC 13.2*  HGB 8.6*  HCT 27.3*  PLT 206    Chemistries   Recent Labs Lab 07/22/15 2116  07/25/15 0947  NA 142  < > 140  K  4.4  < > 4.8  CL 105  < > 110  CO2 26  < > 21*  GLUCOSE 151*  < > 232*  BUN 53*  < > 68*  CREATININE 2.06*  < > 2.31*  CALCIUM 8.9  < > 8.2*  AST 22  --   --   ALT 13*  --   --   ALKPHOS 60  --   --   BILITOT 0.2*  --   --   < > = values in this interval not displayed.  Cardiac Enzymes  Recent Labs Lab 07/23/15 0452  TROPONINI <0.03    Microbiology Results  Results for orders placed or performed during the hospital encounter of 04/01/15  Blood culture (single)  Status: None   Collection Time: 04/01/15  8:43 AM  Result Value Ref Range Status   Specimen Description BLOOD  Final   Special Requests BLOOD  Final   Culture NO GROWTH 5 DAYS  Final   Report Status 04/06/2015 FINAL  Final  Blood culture (single)     Status: None   Collection Time: 04/01/15  8:43 AM  Result Value Ref Range Status   Specimen Description BLOOD  Final   Special Requests BLOOD  Final   Culture NO GROWTH 5 DAYS  Final   Report Status 04/06/2015 FINAL  Final  Culture, blood (routine x 2)     Status: None   Collection Time: 04/01/15 11:25 AM  Result Value Ref Range Status   Specimen Description BLOOD  Final   Special Requests NONE  Final   Culture NO GROWTH 5 DAYS  Final   Report Status 04/06/2015 FINAL  Final  Culture, blood (routine x 2)     Status: None   Collection Time: 04/01/15 11:37 AM  Result Value Ref Range Status   Specimen Description BLOOD  Final   Special Requests NONE  Final   Culture NO GROWTH 5 DAYS  Final   Report Status 04/06/2015 FINAL  Final    RADIOLOGY:  No results found.  EKG:   Orders placed or performed during the hospital encounter of 07/22/15  . ED EKG  . ED EKG  . ED EKG  . ED EKG  . EKG 12-Lead  . EKG 12-Lead      Management plans discussed with the patient, family and they are in agreement.  CODE STATUS:     Code Status Orders        Start     Ordered   07/22/15 2001  Do not attempt resuscitation (DNR)   Continuous    Question Answer  Comment  In the event of cardiac or respiratory ARREST Do not call a "code blue"   In the event of cardiac or respiratory ARREST Do not perform Intubation, CPR, defibrillation or ACLS   In the event of cardiac or respiratory ARREST Use medication by any route, position, wound care, and other measures to relive pain and suffering. May use oxygen, suction and manual treatment of airway obstruction as needed for comfort.   Comments Discussed with patient's daughter      07/22/15 2001    Advance Directive Documentation        Most Recent Value   Type of Advance Directive  Healthcare Power of Attorney   Pre-existing out of facility DNR order (yellow form or pink MOST form)     "MOST" Form in Place?        TOTAL TIME TAKING CARE OF THIS PATIENT: 45 minutes.   Prolonged discussion with care management about discharge planning, home health recommendations. Patient's family apparently refused  physical therapist input at home, patient will be discharged with recommendations of [facility] RN  follow-up  Thaddius Manes M.D on 07/25/2015 at 4:08 PM  Between 7am to 6pm - Pager - [phone]  After 6pm go to [email or web address] - password [email or web address] [facility]  [facility]  Office  [phone]  CC: Primary care physician; Marcine Matar, MD

## 2015-07-25 NOTE — Plan of Care (Signed)
Problem: Discharge Progression Outcomes Goal: Other Discharge Outcomes/Goals Outcome: Adequate for Discharge Diet: Pt tolerating her diet. Activity: Up to [facility] with one person assist. Hemodynamics: Vital signs stable. Pt got one unit of prbc's. H&H checked after transfusion. Hgb up to 8.3. CBC ordered for this am.  Pt for possible discharge today.

## 2015-07-25 NOTE — Progress Notes (Signed)
Visit made. Patient seen sitting up in bed, alert, daughter Kenney Houseman at bedside, staff RN Dot Lanes also present. Patient received 1 unit of PRBC's yesterday, hemoglobin up to 8.2 this morning, oxygen saturation has been 97% on room air. Plan is for discharge today back home with her daughter, she will discharge via private vehicle. Face to Face and Home Health orders faxed to referral intake. Of note Physical therapy was ordered, patient's daughter Kenney Houseman has declined this service. Thank you. Dayna Barker RN, BSN, [facility], [facility] [phone] c

## 2015-09-03 ENCOUNTER — Telehealth: Payer: Self-pay | Admitting: *Deleted

## 2015-09-03 NOTE — Telephone Encounter (Signed)
Burping, tight abdomen, small weight gain This makes the concern for constipation When was her last bowel movement? May need laxative  Would consider follow-up with PMD and abdomen is tight, abdominal x-ray to look for bowel obstruction If needed could be put on schedule with chris if no constipation issues

## 2015-09-03 NOTE — Telephone Encounter (Signed)
Spoke w/ pt's daughter.  She reports that pt had a bowel yesterday and asks for clarification as to what an obstruction is.  After some discussion, she is agreeable to speaking w/ pt's PCP to make them aware of pt's sx.  Asked her to call back if we can be of further assistance.

## 2015-09-03 NOTE — Telephone Encounter (Signed)
Pt daughter calling stating she does not know if this is a pcp call or not But since Monday hasn't been herself. She's gained some weight and is really weak For the weight part she did increase the fluid pills on Monday and tuesaday instead of 1 pill shes doing 2 And today she took 40 mg of fluid pill Pt did throw up a bit yesterday morning but not sure if it was this medication or not She is also just started burping. Just air nothing with it. Please advise.

## 2015-09-03 NOTE — Telephone Encounter (Signed)
Spoke w/ pt's daughter.  She reports that pt has dementia, has noticed wt gain of 2-3 lbs in last 2 days.   Pt does not swell in her ankles, but her abdomen is tight.  Daughter has been pushing fluids, as pt has been urinating more.  Advised her to let pt drink when she is thirst and to offer ice chips of available to avoid the volume of water or soda.  She has been giving lasix 40 mg BID x 2-3 days, concerned that pt is not drinking or peeing as much, wt has not decreased. Advised her not to give additional lasix until I speak w/ Dr. Mariah Milling. Advised her to limit pt's fluid & sodium intake.  She has a call in to pt's PCP, as well. Reports that pt has been burping a lot for the past 2 days, vomited once after drinking a whole diet caffeine free soda (she drinks this throughout the day). Pt denies chest pain, SOB or diaphoresis.  She would like to know if pt can be worked in to be seen of if Dr. Mariah Milling can give lasix instructions over the phone.

## 2015-09-18 ENCOUNTER — Ambulatory Visit: Payer: Medicare PPO | Admitting: Family

## 2015-09-23 ENCOUNTER — Other Ambulatory Visit: Payer: Self-pay | Admitting: Family Medicine

## 2015-09-23 DIAGNOSIS — R1314 Dysphagia, pharyngoesophageal phase: Secondary | ICD-10-CM

## 2015-09-23 DIAGNOSIS — R1310 Dysphagia, unspecified: Secondary | ICD-10-CM

## 2015-09-24 ENCOUNTER — Ambulatory Visit
Admission: RE | Admit: 2015-09-24 | Discharge: 2015-09-24 | Disposition: A | Discharge status: Home / Self Care | Payer: Medicare PPO | Source: Ambulatory Visit | Attending: Family Medicine | Admitting: Family Medicine

## 2015-09-24 DIAGNOSIS — K228 Other specified diseases of esophagus: Secondary | ICD-10-CM | POA: Insufficient documentation

## 2015-09-24 DIAGNOSIS — R1310 Dysphagia, unspecified: Secondary | ICD-10-CM

## 2015-09-24 DIAGNOSIS — R1314 Dysphagia, pharyngoesophageal phase: Secondary | ICD-10-CM

## 2015-09-25 ENCOUNTER — Ambulatory Visit: Payer: Medicare PPO

## 2015-10-03 ENCOUNTER — Telehealth: Payer: Self-pay

## 2015-10-03 NOTE — Telephone Encounter (Signed)
Received cardiac clearance request for pt to proceed w/ upper endoscopy on 10/17/15 w/ Dr. Marva Panda.   Per Dr. Mariah Milling, pt is cleared to proceed.  Faxed to [facility] @ [phone].

## 2015-10-17 ENCOUNTER — Ambulatory Visit: Admission: RE | Admit: 2015-10-17 | Payer: Medicare PPO | Source: Ambulatory Visit | Admitting: Gastroenterology

## 2015-10-17 ENCOUNTER — Encounter: Admission: RE | Payer: Self-pay | Source: Ambulatory Visit

## 2015-10-17 SURGERY — ESOPHAGOGASTRODUODENOSCOPY (EGD) WITH PROPOFOL
Anesthesia: General

## 2015-10-27 ENCOUNTER — Ambulatory Visit: Payer: Medicare PPO | Admitting: Cardiovascular Disease

## 2015-11-05 ENCOUNTER — Encounter: Payer: Self-pay | Admitting: *Deleted

## 2015-11-06 ENCOUNTER — Encounter
Admission: RE | Disposition: A | Discharge status: Home / Self Care | Payer: Self-pay | Source: Ambulatory Visit | Attending: Gastroenterology

## 2015-11-06 ENCOUNTER — Encounter: Payer: Self-pay | Admitting: Anesthesiology

## 2015-11-06 ENCOUNTER — Ambulatory Visit: Payer: Medicare PPO | Admitting: Anesthesiology

## 2015-11-06 ENCOUNTER — Ambulatory Visit
Admission: RE | Admit: 2015-11-06 | Discharge: 2015-11-06 | Disposition: A | Discharge status: Home / Self Care | Payer: Medicare PPO | Source: Ambulatory Visit | Attending: Gastroenterology | Admitting: Gastroenterology

## 2015-11-06 DIAGNOSIS — I130 Hypertensive heart and chronic kidney disease with heart failure and stage 1 through stage 4 chronic kidney disease, or unspecified chronic kidney disease: Secondary | ICD-10-CM | POA: Diagnosis not present

## 2015-11-06 DIAGNOSIS — R933 Abnormal findings on diagnostic imaging of other parts of digestive tract: Secondary | ICD-10-CM | POA: Insufficient documentation

## 2015-11-06 DIAGNOSIS — Z87891 Personal history of nicotine dependence: Secondary | ICD-10-CM | POA: Diagnosis not present

## 2015-11-06 DIAGNOSIS — F419 Anxiety disorder, unspecified: Secondary | ICD-10-CM | POA: Diagnosis not present

## 2015-11-06 DIAGNOSIS — E1122 Type 2 diabetes mellitus with diabetic chronic kidney disease: Secondary | ICD-10-CM | POA: Insufficient documentation

## 2015-11-06 DIAGNOSIS — F0390 Unspecified dementia without behavioral disturbance: Secondary | ICD-10-CM | POA: Diagnosis not present

## 2015-11-06 DIAGNOSIS — Z794 Long term (current) use of insulin: Secondary | ICD-10-CM | POA: Diagnosis not present

## 2015-11-06 DIAGNOSIS — J449 Chronic obstructive pulmonary disease, unspecified: Secondary | ICD-10-CM | POA: Insufficient documentation

## 2015-11-06 DIAGNOSIS — I480 Paroxysmal atrial fibrillation: Secondary | ICD-10-CM | POA: Insufficient documentation

## 2015-11-06 DIAGNOSIS — I252 Old myocardial infarction: Secondary | ICD-10-CM | POA: Diagnosis not present

## 2015-11-06 DIAGNOSIS — K209 Esophagitis, unspecified: Secondary | ICD-10-CM | POA: Insufficient documentation

## 2015-11-06 DIAGNOSIS — R0902 Hypoxemia: Secondary | ICD-10-CM | POA: Diagnosis not present

## 2015-11-06 DIAGNOSIS — N189 Chronic kidney disease, unspecified: Secondary | ICD-10-CM | POA: Diagnosis not present

## 2015-11-06 DIAGNOSIS — I5022 Chronic systolic (congestive) heart failure: Secondary | ICD-10-CM | POA: Insufficient documentation

## 2015-11-06 DIAGNOSIS — I739 Peripheral vascular disease, unspecified: Secondary | ICD-10-CM | POA: Insufficient documentation

## 2015-11-06 DIAGNOSIS — Z9981 Dependence on supplemental oxygen: Secondary | ICD-10-CM | POA: Insufficient documentation

## 2015-11-06 DIAGNOSIS — Z7982 Long term (current) use of aspirin: Secondary | ICD-10-CM | POA: Diagnosis not present

## 2015-11-06 DIAGNOSIS — Z79899 Other long term (current) drug therapy: Secondary | ICD-10-CM | POA: Insufficient documentation

## 2015-11-06 DIAGNOSIS — R1310 Dysphagia, unspecified: Secondary | ICD-10-CM | POA: Diagnosis present

## 2015-11-06 DIAGNOSIS — Z7951 Long term (current) use of inhaled steroids: Secondary | ICD-10-CM | POA: Insufficient documentation

## 2015-11-06 DIAGNOSIS — Z8249 Family history of ischemic heart disease and other diseases of the circulatory system: Secondary | ICD-10-CM | POA: Diagnosis not present

## 2015-11-06 HISTORY — PX: ESOPHAGOGASTRODUODENOSCOPY (EGD) WITH PROPOFOL: SHX5813

## 2015-11-06 LAB — GLUCOSE, CAPILLARY: Glucose-Capillary: 156 mg/dL — ABNORMAL HIGH (ref 65–99)

## 2015-11-06 SURGERY — ESOPHAGOGASTRODUODENOSCOPY (EGD) WITH PROPOFOL
Anesthesia: General

## 2015-11-06 MED ORDER — IPRATROPIUM-ALBUTEROL 0.5-2.5 (3) MG/3ML IN SOLN
RESPIRATORY_TRACT | Status: AC
  Administered 2015-11-06: 3 mL
  Filled 2015-11-06: qty 3

## 2015-11-06 MED ORDER — PROPOFOL 10 MG/ML IV BOLUS
INTRAVENOUS | Status: DC | PRN
  Administered 2015-11-06: 50 mg via INTRAVENOUS

## 2015-11-06 MED ORDER — LIDOCAINE HCL (CARDIAC) 20 MG/ML IV SOLN
INTRAVENOUS | Status: DC | PRN
  Administered 2015-11-06: 67 mg via INTRAVENOUS

## 2015-11-06 MED ORDER — SODIUM CHLORIDE 0.9 % IV SOLN
INTRAVENOUS | Status: DC
Start: 2015-11-06 — End: 2015-11-06
  Administered 2015-11-06: 1000 mL via INTRAVENOUS

## 2015-11-06 MED ORDER — IPRATROPIUM-ALBUTEROL 0.5-2.5 (3) MG/3ML IN SOLN: 3.0000 mL | Freq: Once | RESPIRATORY_TRACT | Status: DC

## 2015-11-06 MED ORDER — GLYCOPYRROLATE 0.2 MG/ML IJ SOLN
INTRAMUSCULAR | Status: DC | PRN
  Administered 2015-11-06: 0.2 mg via INTRAVENOUS

## 2015-11-06 MED ORDER — FENTANYL CITRATE (PF) 100 MCG/2ML IJ SOLN
INTRAMUSCULAR | Status: DC | PRN
  Administered 2015-11-06: 50 ug via INTRAVENOUS

## 2015-11-06 NOTE — Transfer of Care (Signed)
Immediate Anesthesia Transfer of Care Note  Patient: Laura Jefferson  Procedure(s) Performed: Procedure(s): ESOPHAGOGASTRODUODENOSCOPY (EGD) WITH PROPOFOL (N/A)  Patient Location: PACU  Anesthesia Type:General  Level of Consciousness: awake, alert , oriented and patient cooperative  Airway & Oxygen Therapy: Patient Spontanous Breathing and Patient connected to nasal cannula oxygen  Post-op Assessment: Report given to RN and Post -op Vital signs reviewed and stable  Post vital signs: Reviewed and stable  Last Vitals:  Filed Vitals:   11/06/15 1016 11/06/15 1148  BP: 131/41 118/65  Pulse: 64 83  Temp: 36 C 36 C  Resp: 16 16    Complications: No apparent anesthesia complications

## 2015-11-06 NOTE — Discharge Instructions (Signed)

## 2015-11-06 NOTE — H&P (Addendum)
Primary Care Physician:  Marcine Matar, MD  Pre-Procedure History & Physical: HPI:  Laura Jefferson is a 81 y.o. female is here for an endoscopy.   Past Medical History  Diagnosis Date  . Muscle weakness   . Non-ST elevated myocardial infarction [facility])     a. likely demand ischemia 02/2015 in the setting of ARF and acute on chronic CHF  . Humerus fracture     a. left, b. 02/2015; c. in the setting of increased weakness  . Dysphagia   . Anemia   . Paroxysmal a-fib (HCC)     a. reported a-fib, no documented EKGs showing this  . Chronic systolic CHF (congestive heart failure) (HCC)     a. echo 02/2015: EF 40-45%, mod dilated LA, mildly dilated RA, mod MR/TR, mildly elevated PASP 43 mm Hg, no evidence of intracardiac shunting, intact interatrial and interventricular septa  . COPD (chronic obstructive pulmonary disease) (HCC)     a. on home O2  . Hypertensive chronic kidney disease   . Diabetes mellitus without complication (HCC)   . PVD (peripheral vascular disease) (HCC)   . Dementia   . Anxiety disorder     Past Surgical History  Procedure Laterality Date  . Arm surgery    . Peripheral arterial stent graft      Prior to Admission medications   Medication Sig Start Date End Date Taking? Authorizing Provider  albuterol (PROVENTIL HFA;VENTOLIN HFA) 108 (90 BASE) MCG/ACT inhaler Inhale 1 puff into the lungs every 6 (six) hours as needed for wheezing or shortness of breath.   Yes Historical Provider, MD  albuterol (PROVENTIL) (2.5 MG/3ML) 0.083% nebulizer solution Take 3 mLs (2.5 mg total) by nebulization every 4 (four) hours as needed for wheezing or shortness of breath. 07/25/15  Yes Katharina Caper, MD  aspirin EC 81 MG tablet Take 81 mg by mouth at bedtime.   Yes Historical Provider, MD  Cholecalciferol (VITAMIN D) 2000 UNITS tablet Take 2,000 Units by mouth daily.   Yes Historical Provider, MD  diazepam (VALIUM) 10 MG tablet Take 10 mg by mouth daily as needed for anxiety.    Yes Historical Provider, MD  docusate sodium (COLACE) 100 MG capsule Take 100-200 mg by mouth 2 (two) times daily as needed for mild constipation.   Yes Historical Provider, MD  folic acid (FOLVITE) 1 MG tablet Take 1 mg by mouth at bedtime.   Yes Historical Provider, MD  furosemide (LASIX) 40 MG tablet Take 40 mg by mouth daily.    Yes Historical Provider, MD  glipiZIDE (GLUCOTROL XL) 10 MG 24 hr tablet Take 10 mg by mouth daily.   Yes Historical Provider, MD  guaiFENesin (MUCINEX) 600 MG 12 hr tablet Take 1 tablet (600 mg total) by mouth 2 (two) times daily. 07/25/15  Yes Katharina Caper, MD  HYDROcodone-acetaminophen (NORCO/VICODIN) 5-325 MG per tablet Take 1 tablet by mouth every 6 (six) hours as needed for moderate pain.   Yes Historical Provider, MD  insulin aspart (NOVOLOG) 100 UNIT/ML injection Inject 4 Units into the skin 3 (three) times daily with meals. 07/25/15  Yes Katharina Caper, MD  insulin detemir (LEVEMIR) 100 UNIT/ML injection Inject 0.1 mLs (10 Units total) into the skin at bedtime. 07/25/15  Yes Katharina Caper, MD  levofloxacin (LEVAQUIN) 250 MG tablet Take 1 tablet (250 mg total) by mouth every other day. 07/25/15  Yes Katharina Caper, MD  methylPREDNISolone (MEDROL DOSEPAK) 4 MG TBPK tablet follow package directions 07/25/15  Yes Rima  Winona Legato, MD  metoprolol tartrate (LOPRESSOR) 25 MG tablet Take 25 mg by mouth 2 (two) times daily.   Yes Historical Provider, MD  PARoxetine (PAXIL) 20 MG tablet Take 20 mg by mouth daily.   Yes Historical Provider, MD  psyllium (METAMUCIL) 58.6 % packet Take 1 packet by mouth daily as needed (for constipation).   Yes Historical Provider, MD  risperiDONE (RISPERDAL) 0.5 MG tablet Take 0.25 mg by mouth at bedtime.   Yes Historical Provider, MD  simvastatin (ZOCOR) 20 MG tablet Take 20 mg by mouth at bedtime.    Yes Historical Provider, MD  Tiotropium Bromide Monohydrate 2.5 MCG/ACT AERS Inhale 2 puffs into the lungs daily.    Yes Historical Provider, MD   vitamin B-12 (CYANOCOBALAMIN) 1000 MCG tablet Take 1,000 mcg by mouth daily.   Yes Historical Provider, MD  insulin aspart (NOVOLOG) 100 UNIT/ML injection Inject 0-9 Units into the skin 3 (three) times daily with meals. 07/25/15   Katharina Caper, MD    Allergies as of 10/30/2015  . (No Known Allergies)    Family History  Problem Relation Age of Onset  . CAD Son     Social History   Social History  . Marital Status: Widowed    Spouse Name: N/A  . Number of Children: N/A  . Years of Education: N/A   Occupational History  . Not on file.   Social History Main Topics  . Smoking status: Former Smoker -- 70 years    Types: Cigarettes    Quit date: 02/24/2015  . Smokeless tobacco: Not on file  . Alcohol Use: No  . Drug Use: No  . Sexual Activity: Not on file   Other Topics Concern  . Not on file   Social History Narrative     Physical Exam: BP 131/41 mmHg  Pulse 64  Temp(Src) 96.8 F (36 C) (Tympanic)  Resp 16  Ht  (1.549 m)  Wt 47.628 kg (105 lb)  BMI 19.85 kg/m2  SpO2 96% General:   Alert,  pleasant and cooperative in NAD Head:  Normocephalic and atraumatic. Neck:  Supple; no masses or thyromegaly. Lungs:  Clear throughout to auscultation.    Heart:  Regular rate and rhythm. Abdomen:  Soft, nontender and nondistended. Normal bowel sounds, without guarding, and without rebound.   Neurologic:  Alert and  oriented x4;  grossly normal neurologically.  Impression/Plan: Laura Jefferson is here for an endoscopy to be performed for dysphagia, abnormal barium swallow  Risks, benefits, limitations, and alternatives regarding  endoscopy have been reviewed with the patient and daughter.  Questions have been answered.  All parties agreeable.   Elnita Maxwell, MD  11/06/2015, 11:25 AM

## 2015-11-06 NOTE — Brief Op Note (Signed)
10:35 a.m.  Hold Metoprolol this a.m. Per Dr. Randa Ngo.

## 2015-11-06 NOTE — Anesthesia Postprocedure Evaluation (Signed)
Anesthesia Post Note  Patient: ALLAYAH EGERER  Procedure(s) Performed: Procedure(s) (LRB): ESOPHAGOGASTRODUODENOSCOPY (EGD) WITH PROPOFOL (N/A)  Patient location during evaluation: Endoscopy Anesthesia Type: General Level of consciousness: awake and alert Pain management: pain level controlled Vital Signs Assessment: post-procedure vital signs reviewed and stable Respiratory status: spontaneous breathing, nonlabored ventilation, respiratory function stable and patient connected to nasal cannula oxygen Cardiovascular status: blood pressure returned to baseline and stable Postop Assessment: no signs of nausea or vomiting Anesthetic complications: no    Last Vitals:  Filed Vitals:   11/06/15 1220 11/06/15 1230  BP: 136/44 132/40  Pulse: 80 76  Temp:    Resp: 16 19    Last Pain: There were no vitals filed for this visit.               Cleda Mccreedy Misha Vanoverbeke

## 2015-11-06 NOTE — Op Note (Signed)
[facility] Patient Name: Mayerli Kirst Procedure Date: 11/06/2015 11:30 AM MRN: [id number] Account #: [id number] Date of Birth: [birth year] Admit Type: Outpatient Age: 81 Room: [facility] ENDO ROOM 3 Gender: Female Note Status: Finalized Procedure:         Upper GI endoscopy Indications:       Dysphagia, Abnormal UGI series( narrowing in lower                     esophagus which held up tablet on barium swallow Patient Profile:   This is an 80 year old female. Providers:         Rhona Raider. Shelle Iron, MD Referring MD:      Illa Level (Referring MD) Medicines:         Propofol per Anesthesia Complications:     No immediate complications. Procedure:         Pre-Anesthesia Assessment:                    - Prior to the procedure, a History and Physical was                     performed, and patient medications, allergies and                     sensitivities were reviewed. The patient's tolerance of                     previous anesthesia was reviewed.                    After obtaining informed consent, the endoscope was passed                     under direct vision. Throughout the procedure, the                     patient's blood pressure, pulse, and oxygen saturations                     were monitored continuously. The Olympus GIF-160 endoscope                     (S#. [id number]) was introduced through the mouth, with the                     intention of advancing to the stomach. The scope was                     advanced to the antrum before the procedure was aborted.                     Medications were not given. The upper GI endoscopy was                     accomplished without difficulty. The patient tolerated the                     procedure poorly due to the patient's respiratory                     instability (hypoxia). The procedure was aborted due to                     presence of food. Findings:  LA Grade B (one or more mucosal  breaks greater than 5 mm, not extending       between the tops of two mucosal folds) esophagitis with no bleeding was       found in the middle third of the esophagus.      A medium amount of food (residue) was found in the gastric body. Impression:        - The procedure was aborted due to presence of food.                    - LA Grade B esophagitis. No target for dilation.                    - A medium amount of food (residue) in the stomach.                    - No specimens collected.                    - Procedure aborted due to food in stomach.                    - Also developed hypoxia which reversed with jaw lift. Recommendation:    - Observe patient in GI recovery unit.                    - Mechanical soft diet.                    - Continue present medications.                    - Use prilosec 40 mg daily.                    - Consider speech and swallow eval.                    - The findings and recommendations were discussed with the                     patient.                    - The findings and recommendations were discussed with the                     patient's family. Procedure Code(s): --- Professional ---                    [phone], 52, Esophagogastroduodenoscopy, flexible,                     transoral; diagnostic, including collection of specimen(s)                     by brushing or washing, when performed (separate procedure) Diagnosis Code(s): --- Professional ---                    Z53.8, Procedure and treatment not carried out for other                     reasons                    K20.9, Esophagitis, unspecified  R13.10, Dysphagia, unspecified                    R93.3, Abnormal findings on diagnostic imaging of other                     parts of digestive tract CPT copyright 2014 American Medical Association. All rights reserved. The codes documented in this report are preliminary and upon coder review may  be revised to meet current  compliance requirements. Kathalene Frames, MD 11/06/2015 11:45:38 AM This report has been signed electronically. Number of Addenda: 0 Note Initiated On: 11/06/2015 11:30 AM      [facility]

## 2015-11-06 NOTE — Anesthesia Preprocedure Evaluation (Signed)
Anesthesia Evaluation  Patient identified by MRN, date of birth, ID band Patient awake    Reviewed: Allergy & Precautions, H&P , NPO status , Patient's Chart, lab work & pertinent test results  History of Anesthesia Complications Negative for: history of anesthetic complications  Airway Mallampati: III  TM Distance: >3 FB Neck ROM: limited    Dental  (+) Poor Dentition, Missing, Upper Dentures, Lower Dentures   Pulmonary neg shortness of breath, COPD, former smoker,    Pulmonary exam normal breath sounds clear to auscultation       Cardiovascular Exercise Tolerance: Good (-) angina+ CAD, + Past MI, + Peripheral Vascular Disease and +CHF  (-) DOE Normal cardiovascular exam Rhythm:regular Rate:Normal     Neuro/Psych PSYCHIATRIC DISORDERS negative neurological ROS     GI/Hepatic negative GI ROS, Neg liver ROS,   Endo/Other  diabetes, Type 2  Renal/GU Renal disease  negative genitourinary   Musculoskeletal   Abdominal   Peds  Hematology negative hematology ROS (+)   Anesthesia Other Findings Past Medical History:   Muscle weakness                                              Non-ST elevated myocardial infarction (HCC)                    Comment:a. likely demand ischemia 02/2015 in the setting              of ARF and acute on chronic CHF   Humerus fracture                                               Comment:a. left, b. 02/2015; c. in the setting of               increased weakness   Dysphagia                                                    Anemia                                                       Paroxysmal a-fib (HCC)                                         Comment:a. reported a-fib, no documented EKGs showing               this   Chronic systolic CHF (congestive heart failure*                Comment:a. echo 02/2015: EF 40-45%, mod dilated LA,               mildly dilated RA, mod MR/TR, mildly elevated             PASP 43 mm Hg, no evidence of  intracardiac               shunting, intact interatrial and               interventricular septa   COPD (chronic obstructive pulmonary disease) (*                Comment:a. on home O2   Hypertensive chronic kidney disease                          Diabetes mellitus without complication (HCC)                 PVD (peripheral vascular disease) (HCC)                      Dementia                                                     Anxiety disorder                                            Past Surgical History:   arm surgery                                                   PERIPHERAL ARTERIAL STENT GRAFT                              BMI    Body Mass Index   19.84 kg/m 2      Reproductive/Obstetrics negative OB ROS                             Anesthesia Physical Anesthesia Plan  ASA: IV  Anesthesia Plan: General   Post-op Pain Management:    Induction:   Airway Management Planned:   Additional Equipment:   Intra-op Plan:   Post-operative Plan:   Informed Consent: I have reviewed the patients History and Physical, chart, labs and discussed the procedure including the risks, benefits and alternatives for the proposed anesthesia with the patient or authorized representative who has indicated his/her understanding and acceptance.   Dental Advisory Given  Plan Discussed with: Anesthesiologist, CRNA and Surgeon  Anesthesia Plan Comments:         Anesthesia Quick Evaluation

## 2015-11-07 ENCOUNTER — Ambulatory Visit: Payer: Medicare PPO | Admitting: Cardiovascular Disease

## 2015-11-08 ENCOUNTER — Encounter: Payer: Self-pay | Admitting: Gastroenterology

## 2015-12-30 ENCOUNTER — Encounter: Payer: Self-pay | Admitting: Cardiovascular Disease

## 2015-12-30 ENCOUNTER — Ambulatory Visit (INDEPENDENT_AMBULATORY_CARE_PROVIDER_SITE_OTHER): Payer: Medicare PPO | Admitting: Cardiovascular Disease

## 2015-12-30 VITALS — BP 132/60 | HR 66 | Ht 62.0 in | Wt 106.2 lb

## 2015-12-30 DIAGNOSIS — I480 Paroxysmal atrial fibrillation: Secondary | ICD-10-CM

## 2015-12-30 DIAGNOSIS — J441 Chronic obstructive pulmonary disease with (acute) exacerbation: Secondary | ICD-10-CM | POA: Diagnosis not present

## 2015-12-30 DIAGNOSIS — N179 Acute kidney failure, unspecified: Secondary | ICD-10-CM | POA: Diagnosis not present

## 2015-12-30 DIAGNOSIS — J209 Acute bronchitis, unspecified: Secondary | ICD-10-CM

## 2015-12-30 DIAGNOSIS — N189 Chronic kidney disease, unspecified: Secondary | ICD-10-CM

## 2015-12-30 MED ORDER — AZITHROMYCIN 250 MG PO TABS
ORAL_TABLET | ORAL | Status: DC
Start: 2015-12-30 — End: 2016-01-01

## 2015-12-30 MED ORDER — PREDNISONE 10 MG (21) PO TBPK: 10.0000 mg | ORAL_TABLET | ORAL | Status: DC

## 2015-12-30 NOTE — Progress Notes (Signed)
Patient ID: Laura Jefferson, female    DOB: June 10, 1925, 82 y.o.   MRN: [id number]  HPI Comments: Laura Jefferson is a 82 y.o. female with history of dementia severe COPD, long smoking history,  hypertension, diabetes, chronic kidney disease, anemia, who presents for follow-up of her chronic diastolic CHF. She stopped smoking one year ago At baseline walks with assistance Daughter reports that she drinks large amounts of fluid daily  In follow-up today, she continues Lasix 40 mg daily. Taking potassium every other day Recent lab work by Dr. Burnett Sheng shows stable but elevated creatinine Chronic but stable lower extremity swelling, Recent wheezing, cough over the past 4-5 days. Daughter has been giving Mucinex  For the past 2 weeks waking up at nighttime gasping for breath, tremendous wheezing. Symptoms improve after nebulizer treatment. Daughter has started giving nebulizer treatment before bed for symptoms Not requiring nebulizer treatment in the daytime Daughter does report that she did have prednisone and antibiotics over one month ago for wheezing symptoms with bronchitis  EKG on today's visit shows normal sinus rhythm with APCs, rate 66 bpm, no significant ST or T-wave changes  Other past medical history reviewed  02/28/2015, fell,  fracturing her left humerus.   She had an echocardiogram which revealed mild left ventricular dysfunction with an ejection fraction of 40-45%. She had moderate left atrial dilatation, mild right atrial dilatation, moderate mitral regurgitation, moderate tricuspid regurgitation. She had mild - moderate pulmonary artery hypertension with an estimated PA pressure of 43 mm hg.    found to have chronic kidney disease, significant anemia with hemoglobin of 7.5 that later dropped to 6.8 with hydration. She gradually improved and was discharged to [facility].   Problem List 1.  Hx of Atrial fib 2. Dementia 3. Essential Hypertension 4. CHF 5.  Diabetes Mellitus 6. Chronic kidney disease- estimated GFR of 24 7. Anemia   No Known Allergies  Current Outpatient Prescriptions on File Prior to Visit  Medication Sig Dispense Refill  . albuterol (PROVENTIL HFA;VENTOLIN HFA) 108 (90 BASE) MCG/ACT inhaler Inhale 1 puff into the lungs every 6 (six) hours as needed for wheezing or shortness of breath.    Marland Kitchen albuterol (PROVENTIL) (2.5 MG/3ML) 0.083% nebulizer solution Take 3 mLs (2.5 mg total) by nebulization every 4 (four) hours as needed for wheezing or shortness of breath. 75 mL 12  . aspirin EC 81 MG tablet Take 81 mg by mouth at bedtime.    . Cholecalciferol (VITAMIN D) 2000 UNITS tablet Take 2,000 Units by mouth daily.    . diazepam (VALIUM) 10 MG tablet Take 10 mg by mouth daily as needed for anxiety.    . docusate sodium (COLACE) 100 MG capsule Take 100-200 mg by mouth 2 (two) times daily as needed for mild constipation.    . folic acid (FOLVITE) 1 MG tablet Take 1 mg by mouth at bedtime.    . furosemide (LASIX) 40 MG tablet Take 40 mg by mouth daily.     Marland Kitchen glipiZIDE (GLUCOTROL XL) 10 MG 24 hr tablet Take 10 mg by mouth daily.    Marland Kitchen guaiFENesin (MUCINEX) 600 MG 12 hr tablet Take 1 tablet (600 mg total) by mouth 2 (two) times daily. 20 tablet 0  . HYDROcodone-acetaminophen (NORCO/VICODIN) 5-325 MG per tablet Take 1 tablet by mouth every 6 (six) hours as needed for moderate pain.    . metoprolol tartrate (LOPRESSOR) 25 MG tablet Take 25 mg by mouth 2 (two) times daily.    Marland Kitchen  PARoxetine (PAXIL) 20 MG tablet Take 20 mg by mouth daily.    . psyllium (METAMUCIL) 58.6 % packet Take 1 packet by mouth daily as needed (for constipation).    . risperiDONE (RISPERDAL) 0.5 MG tablet Take 0.5 mg by mouth at bedtime.     . simvastatin (ZOCOR) 20 MG tablet Take 20 mg by mouth at bedtime.     . Tiotropium Bromide Monohydrate 2.5 MCG/ACT AERS Inhale 2 puffs into the lungs daily.     . vitamin B-12 (CYANOCOBALAMIN) 1000 MCG tablet Take 1,000 mcg by mouth  daily.     No current facility-administered medications on file prior to visit.    Past Medical History  Diagnosis Date  . Muscle weakness   . Non-ST elevated myocardial infarction [facility])     a. likely demand ischemia 02/2015 in the setting of ARF and acute on chronic CHF  . Humerus fracture     a. left, b. 02/2015; c. in the setting of increased weakness  . Dysphagia   . Anemia   . Paroxysmal a-fib (HCC)     a. reported a-fib, no documented EKGs showing this  . Chronic systolic CHF (congestive heart failure) (HCC)     a. echo 02/2015: EF 40-45%, mod dilated LA, mildly dilated RA, mod MR/TR, mildly elevated PASP 43 mm Hg, no evidence of intracardiac shunting, intact interatrial and interventricular septa  . COPD (chronic obstructive pulmonary disease) (HCC)     a. on home O2  . Hypertensive chronic kidney disease   . Diabetes mellitus without complication (HCC)   . PVD (peripheral vascular disease) (HCC)   . Dementia   . Anxiety disorder     Past Surgical History  Procedure Laterality Date  . Arm surgery    . Peripheral arterial stent graft    . Esophagogastroduodenoscopy (egd) with propofol N/A 11/06/2015    Procedure: ESOPHAGOGASTRODUODENOSCOPY (EGD) WITH PROPOFOL;  Surgeon: Elnita Maxwell, MD;  Location: [facility] ENDOSCOPY;  Service: Endoscopy;  Laterality: N/A;    Social History  reports that she quit smoking about 10 months ago. Her smoking use included Cigarettes. She quit after 70 years of use. She does not have any smokeless tobacco history on file. She reports that she does not drink alcohol or use illicit drugs.  Family History family history includes CAD in her son.   Review of Systems  Respiratory: Positive for cough and wheezing.   Cardiovascular: Negative.   Gastrointestinal: Negative.   Musculoskeletal: Positive for gait problem.  Skin: Negative.   Neurological: Positive for weakness.  Hematological: Negative.   Psychiatric/Behavioral: Positive for  confusion.  All other systems reviewed and are negative.   BP 132/60 mmHg  Pulse 66  Ht  (1.575 m)  Wt 106 lb 4 oz (48.195 kg)  BMI 19.43 kg/m2  Physical Exam  Constitutional: She is oriented to person, place, and time. She appears well-developed and well-nourished.  Thin, frail, presenting in a wheelchair  HENT:  Head: Normocephalic.  Nose: Nose normal.  Mouth/Throat: Oropharynx is clear and moist.  Eyes: Conjunctivae are normal. Pupils are equal, round, and reactive to light.  Neck: Normal range of motion. Neck supple. No JVD present.  Cardiovascular: Normal rate, regular rhythm, normal heart sounds and intact distal pulses.  Exam reveals no gallop and no friction rub.   No murmur heard. Pulmonary/Chest: Effort normal. No respiratory distress. She has decreased breath sounds. She has wheezes. She has rales. She exhibits no tenderness.  Abdominal: Soft. Bowel sounds are  normal. She exhibits no distension. There is no tenderness.  Musculoskeletal: Normal range of motion. She exhibits no edema or tenderness.  Lymphadenopathy:    She has no cervical adenopathy.  Neurological: She is alert and oriented to person, place, and time. Coordination normal.  Skin: Skin is warm and dry. No rash noted. No erythema.  Psychiatric: She has a normal mood and affect. Her behavior is normal. Judgment and thought content normal.

## 2015-12-30 NOTE — Assessment & Plan Note (Signed)
High fluid intake in the daytime, on Lasix daily. Renal function has been stable  No changes to her medications. Potassium within normal range

## 2015-12-30 NOTE — Assessment & Plan Note (Signed)
Recent wheezing, coughing.  We have provided prescription for prednisone and Z-Pak if symptoms get worse  Etiology of Episodes at nighttime with acute onset of gasping for breath with wheezing is unclear.  unable to exclude arrhythmia. This was mentioned to the daughter and monitor could be performed if symptoms persist.  Daughter feels symptoms are from COPD and bronchospasm that cause tachycardia.  Recommend she call us if symptoms get worse or persist, 48-hour Holter or 30 day monitor could be performed

## 2015-12-30 NOTE — Assessment & Plan Note (Addendum)
Recent treatment with in zone and antibiotics, unable to exclude recurrent symptoms.  Appears to be doing relatively well on her exam today, no significant coughing.  Given the change in her wheezing, breathing at nighttime, recommend she continue the nebulizer treatments before bed.  Prednisone, and ABX could be used if needed   Total encounter time more than 25 minutes  Greater than 50% was spent in counseling and coordination of care with the patient

## 2015-12-30 NOTE — Patient Instructions (Addendum)
You are doing well.  If wheezing gets worse: Start prednisone taper 40 mg daily x 3 daily The 30 mg daily x 3 days Then 20 mg daily x 3 days then 10 mg daily x 3 days  Also if cough gets worse, Start Z-pak (2 pills the first first day then one pill a day for 4 days)   Please call us if you have new issues that need to be addressed before your next appt.   Your physician wants you to follow-up in: 6 months  You will receive a reminder letter in the mail two months in advance. If you don't receive a letter, please call our office to schedule the follow-up appointment.

## 2016-01-01 ENCOUNTER — Encounter: Payer: Self-pay | Admitting: Emergency Medicine

## 2016-01-01 ENCOUNTER — Inpatient Hospital Stay
Admission: EM | Admit: 2016-01-01 | Discharge: 2016-01-13 | DRG: 280 | Disposition: A | Discharge status: Hospice | Payer: Medicare PPO | Attending: Internal Medicine | Admitting: Internal Medicine

## 2016-01-01 ENCOUNTER — Emergency Department: Payer: Medicare PPO

## 2016-01-01 DIAGNOSIS — E1165 Type 2 diabetes mellitus with hyperglycemia: Secondary | ICD-10-CM | POA: Diagnosis present

## 2016-01-01 DIAGNOSIS — Z515 Encounter for palliative care: Secondary | ICD-10-CM | POA: Diagnosis not present

## 2016-01-01 DIAGNOSIS — I480 Paroxysmal atrial fibrillation: Secondary | ICD-10-CM | POA: Diagnosis present

## 2016-01-01 DIAGNOSIS — J9621 Acute and chronic respiratory failure with hypoxia: Secondary | ICD-10-CM | POA: Diagnosis present

## 2016-01-01 DIAGNOSIS — I130 Hypertensive heart and chronic kidney disease with heart failure and stage 1 through stage 4 chronic kidney disease, or unspecified chronic kidney disease: Secondary | ICD-10-CM | POA: Diagnosis not present

## 2016-01-01 DIAGNOSIS — F419 Anxiety disorder, unspecified: Secondary | ICD-10-CM | POA: Diagnosis present

## 2016-01-01 DIAGNOSIS — E860 Dehydration: Secondary | ICD-10-CM | POA: Diagnosis present

## 2016-01-01 DIAGNOSIS — Z66 Do not resuscitate: Secondary | ICD-10-CM | POA: Diagnosis present

## 2016-01-01 DIAGNOSIS — Z9981 Dependence on supplemental oxygen: Secondary | ICD-10-CM

## 2016-01-01 DIAGNOSIS — I214 Non-ST elevation (NSTEMI) myocardial infarction: Secondary | ICD-10-CM | POA: Diagnosis not present

## 2016-01-01 DIAGNOSIS — J189 Pneumonia, unspecified organism: Secondary | ICD-10-CM | POA: Diagnosis present

## 2016-01-01 DIAGNOSIS — R0600 Dyspnea, unspecified: Secondary | ICD-10-CM

## 2016-01-01 DIAGNOSIS — Z7982 Long term (current) use of aspirin: Secondary | ICD-10-CM

## 2016-01-01 DIAGNOSIS — J9601 Acute respiratory failure with hypoxia: Secondary | ICD-10-CM | POA: Diagnosis present

## 2016-01-01 DIAGNOSIS — T502X5A Adverse effect of carbonic-anhydrase inhibitors, benzothiadiazides and other diuretics, initial encounter: Secondary | ICD-10-CM | POA: Diagnosis not present

## 2016-01-01 DIAGNOSIS — N179 Acute kidney failure, unspecified: Secondary | ICD-10-CM | POA: Diagnosis present

## 2016-01-01 DIAGNOSIS — R0603 Acute respiratory distress: Secondary | ICD-10-CM

## 2016-01-01 DIAGNOSIS — E1122 Type 2 diabetes mellitus with diabetic chronic kidney disease: Secondary | ICD-10-CM | POA: Diagnosis present

## 2016-01-01 DIAGNOSIS — R633 Feeding difficulties: Secondary | ICD-10-CM | POA: Diagnosis present

## 2016-01-01 DIAGNOSIS — I509 Heart failure, unspecified: Secondary | ICD-10-CM

## 2016-01-01 DIAGNOSIS — D638 Anemia in other chronic diseases classified elsewhere: Secondary | ICD-10-CM | POA: Diagnosis present

## 2016-01-01 DIAGNOSIS — I5043 Acute on chronic combined systolic (congestive) and diastolic (congestive) heart failure: Secondary | ICD-10-CM | POA: Diagnosis present

## 2016-01-01 DIAGNOSIS — E872 Acidosis: Secondary | ICD-10-CM | POA: Diagnosis not present

## 2016-01-01 DIAGNOSIS — E870 Hyperosmolality and hypernatremia: Secondary | ICD-10-CM | POA: Diagnosis present

## 2016-01-01 DIAGNOSIS — I739 Peripheral vascular disease, unspecified: Secondary | ICD-10-CM | POA: Diagnosis present

## 2016-01-01 DIAGNOSIS — R0602 Shortness of breath: Secondary | ICD-10-CM

## 2016-01-01 DIAGNOSIS — N184 Chronic kidney disease, stage 4 (severe): Secondary | ICD-10-CM | POA: Diagnosis present

## 2016-01-01 DIAGNOSIS — Z87891 Personal history of nicotine dependence: Secondary | ICD-10-CM

## 2016-01-01 DIAGNOSIS — J449 Chronic obstructive pulmonary disease, unspecified: Secondary | ICD-10-CM

## 2016-01-01 DIAGNOSIS — Z8249 Family history of ischemic heart disease and other diseases of the circulatory system: Secondary | ICD-10-CM

## 2016-01-01 DIAGNOSIS — F0390 Unspecified dementia without behavioral disturbance: Secondary | ICD-10-CM | POA: Diagnosis present

## 2016-01-01 DIAGNOSIS — Z7984 Long term (current) use of oral hypoglycemic drugs: Secondary | ICD-10-CM

## 2016-01-01 DIAGNOSIS — J441 Chronic obstructive pulmonary disease with (acute) exacerbation: Secondary | ICD-10-CM | POA: Diagnosis present

## 2016-01-01 DIAGNOSIS — E119 Type 2 diabetes mellitus without complications: Secondary | ICD-10-CM

## 2016-01-01 DIAGNOSIS — L8990 Pressure ulcer of unspecified site, unspecified stage: Secondary | ICD-10-CM | POA: Diagnosis not present

## 2016-01-01 DIAGNOSIS — J440 Chronic obstructive pulmonary disease with acute lower respiratory infection: Secondary | ICD-10-CM | POA: Diagnosis present

## 2016-01-01 DIAGNOSIS — T380X5A Adverse effect of glucocorticoids and synthetic analogues, initial encounter: Secondary | ICD-10-CM | POA: Diagnosis not present

## 2016-01-01 DIAGNOSIS — I2510 Atherosclerotic heart disease of native coronary artery without angina pectoris: Secondary | ICD-10-CM | POA: Diagnosis present

## 2016-01-01 DIAGNOSIS — Z23 Encounter for immunization: Secondary | ICD-10-CM

## 2016-01-01 LAB — CBC WITH DIFFERENTIAL/PLATELET
BASOS ABS: 0.2 10*3/uL — AB (ref 0–0.1)
Basophils Relative: 1 %
Eosinophils Absolute: 0.8 10*3/uL — ABNORMAL HIGH (ref 0–0.7)
Eosinophils Relative: 4 %
HEMATOCRIT: 29.2 % — AB (ref 35.0–47.0)
HEMOGLOBIN: 9 g/dL — AB (ref 12.0–16.0)
Lymphocytes Relative: 14 %
Lymphs Abs: 2.7 10*3/uL (ref 1.0–3.6)
MCH: 25.4 pg — ABNORMAL LOW (ref 26.0–34.0)
MCHC: 31 g/dL — ABNORMAL LOW (ref 32.0–36.0)
MCV: 82.1 fL (ref 80.0–100.0)
Monocytes Absolute: 1.4 10*3/uL — ABNORMAL HIGH (ref 0.2–0.9)
Monocytes Relative: 7 %
NEUTROS ABS: 14.2 10*3/uL — AB (ref 1.4–6.5)
NEUTROS PCT: 74 %
PLATELETS: 243 10*3/uL (ref 150–440)
RBC: 3.55 MIL/uL — AB (ref 3.80–5.20)
RDW: 18.1 % — ABNORMAL HIGH (ref 11.5–14.5)
WBC: 19.2 10*3/uL — AB (ref 3.6–11.0)

## 2016-01-01 MED ORDER — METHYLPREDNISOLONE SODIUM SUCC 125 MG IJ SOLR
125.0000 mg | Freq: Once | INTRAMUSCULAR | Status: AC
  Administered 2016-01-01: 125 mg via INTRAVENOUS
  Filled 2016-01-01: qty 2

## 2016-01-01 MED ORDER — IPRATROPIUM-ALBUTEROL 0.5-2.5 (3) MG/3ML IN SOLN
3.0000 mL | Freq: Once | RESPIRATORY_TRACT | Status: AC
  Administered 2016-01-01: 3 mL via RESPIRATORY_TRACT
  Filled 2016-01-01: qty 3

## 2016-01-01 MED ORDER — FUROSEMIDE 10 MG/ML IJ SOLN
40.0000 mg | Freq: Once | INTRAMUSCULAR | Status: AC
  Administered 2016-01-02: 40 mg via INTRAVENOUS
  Filled 2016-01-01: qty 4

## 2016-01-01 NOTE — ED Provider Notes (Signed)
[facility] Emergency Department Provider Note   ____________________________________________  Time seen: On EMS arrival  I have reviewed the triage vital signs and the nursing notes.   HISTORY  Chief Complaint Respiratory Distress   History limited by: Respiratory distress  HPI Laura Jefferson is a 82 y.o. female with history of CHF and COPD who presents to the emergency department today because of respiratory distress. The patient had been having a good day according to the daughter. She then started developing some shortness of breath this evening. The daughter did place the patient on her oxygen which she typically normally wears night. Patient was given a DuoNeb treatment however continued to get worse. The daughter states the patient and start being as responsive. The daughter called EMS. Upon EMSs arrival the patient was already on oxygen and they got initial sats on oxygen in the 90s. They stated that the patient was fairly unresponsive. They did initiate CPAP and continue to give DuoNeb treatments during transport. The patient did improve neurologically. Patient denied any chest pain or recent fevers.    Past Medical History  Diagnosis Date  . Muscle weakness   . Non-ST elevated myocardial infarction [facility])     a. likely demand ischemia 02/2015 in the setting of ARF and acute on chronic CHF  . Humerus fracture     a. left, b. 02/2015; c. in the setting of increased weakness  . Dysphagia   . Anemia   . Paroxysmal a-fib (HCC)     a. reported a-fib, no documented EKGs showing this  . Chronic systolic CHF (congestive heart failure) (HCC)     a. echo 02/2015: EF 40-45%, mod dilated LA, mildly dilated RA, mod MR/TR, mildly elevated PASP 43 mm Hg, no evidence of intracardiac shunting, intact interatrial and interventricular septa  . COPD (chronic obstructive pulmonary disease) (HCC)     a. on home O2  . Hypertensive chronic kidney disease   . Diabetes  mellitus without complication (HCC)   . PVD (peripheral vascular disease) (HCC)   . Dementia   . Anxiety disorder     Patient Active Problem List   Diagnosis Date Noted  . Acute on chronic respiratory failure with hypoxia (HCC) 07/25/2015  . Acute on chronic renal failure (HCC) 07/25/2015  . Anemia, iron deficiency 07/25/2015  . Dyspnea 07/22/2015  . Cough 07/22/2015  . COPD exacerbation (HCC) 07/22/2015  . CHF, acute on chronic (HCC) 07/22/2015  . Acute bronchitis 07/22/2015  . Demand ischemia (HCC) 04/02/2015  . Leukocytosis - resolved  04/02/2015  . NSVT (nonsustained ventricular tachycardia) (HCC) 04/02/2015  . Acute exacerbation of chronic obstructive pulmonary disease (COPD) (HCC) 04/02/2015  . Chronic combined systolic and diastolic CHF (congestive heart failure) (HCC)   . Paroxysmal a-fib - by report, no documentation   . Hypertensive chronic kidney disease   . COPD (chronic obstructive pulmonary disease) (HCC)   . Acute on chronic combined systolic and diastolic CHF (congestive heart failure) (HCC) 04/01/2015    Past Surgical History  Procedure Laterality Date  . Arm surgery    . Peripheral arterial stent graft    . Esophagogastroduodenoscopy (egd) with propofol N/A 11/06/2015    Procedure: ESOPHAGOGASTRODUODENOSCOPY (EGD) WITH PROPOFOL;  Surgeon: Elnita Maxwell, MD;  Location: [facility] ENDOSCOPY;  Service: Endoscopy;  Laterality: N/A;    Current Outpatient Rx  Name  Route  Sig  Dispense  Refill  . albuterol (PROVENTIL HFA;VENTOLIN HFA) 108 (90 BASE) MCG/ACT inhaler   Inhalation  Inhale 1 puff into the lungs every 6 (six) hours as needed for wheezing or shortness of breath.         Marland Kitchen aspirin EC 81 MG tablet   Oral   Take 81 mg by mouth at bedtime.         . Cholecalciferol (VITAMIN D) 2000 UNITS tablet   Oral   Take 2,000 Units by mouth daily.         . diazepam (VALIUM) 10 MG tablet   Oral   Take 10 mg by mouth daily as needed for anxiety.          . docusate sodium (COLACE) 100 MG capsule   Oral   Take 100-200 mg by mouth 2 (two) times daily as needed for mild constipation.         . folic acid (FOLVITE) 1 MG tablet   Oral   Take 1 mg by mouth at bedtime.         . furosemide (LASIX) 40 MG tablet   Oral   Take 40 mg by mouth daily.          Marland Kitchen glipiZIDE (GLUCOTROL XL) 10 MG 24 hr tablet   Oral   Take 10 mg by mouth daily.         Marland Kitchen HYDROcodone-acetaminophen (NORCO/VICODIN) 5-325 MG per tablet   Oral   Take 1 tablet by mouth every 6 (six) hours as needed for moderate pain.         . metFORMIN (GLUCOPHAGE) 500 MG tablet   Oral   Take by mouth. Takes 1000 mg am and 500 mg pm daily.         . metoprolol tartrate (LOPRESSOR) 25 MG tablet   Oral   Take 25 mg by mouth 2 (two) times daily.         . Omeprazole (PRILOSEC PO)   Oral   Take by mouth daily.         Marland Kitchen PARoxetine (PAXIL) 20 MG tablet   Oral   Take 20 mg by mouth daily.         . psyllium (METAMUCIL) 58.6 % packet   Oral   Take 1 packet by mouth daily as needed (for constipation).         . risperiDONE (RISPERDAL) 0.5 MG tablet   Oral   Take 0.5 mg by mouth at bedtime.          . simvastatin (ZOCOR) 20 MG tablet   Oral   Take 20 mg by mouth at bedtime.          . Tiotropium Bromide Monohydrate 2.5 MCG/ACT AERS   Inhalation   Inhale 2 puffs into the lungs daily.          . vitamin B-12 (CYANOCOBALAMIN) 1000 MCG tablet   Oral   Take 1,000 mcg by mouth daily.           Allergies Review of patient's allergies indicates no known allergies.  Family History  Problem Relation Age of Onset  . CAD Son     Social History Social History  Substance Use Topics  . Smoking status: Former Smoker -- 70 years    Types: Cigarettes    Quit date: 02/24/2015  . Smokeless tobacco: None  . Alcohol Use: No    Review of Systems  Constitutional: Negative for fever. Cardiovascular: Negative for chest pain. Respiratory: Positive  for shortness of breath. Gastrointestinal: Negative for abdominal pain, vomiting and diarrhea. Neurological: Negative for  headaches, focal weakness or numbness.   10-point ROS otherwise negative.  ____________________________________________   PHYSICAL EXAM:  VITAL SIGNS: ED Triage Vitals  Enc Vitals Group     BP 01/01/16 2233 127/65 mmHg     Pulse Rate 01/01/16 2233 98     Resp 01/01/16 2233 25     Temp 01/01/16 2233 97.7 F (36.5 C)     Temp Source 01/01/16 2233 Axillary     SpO2 01/01/16 2230 100 %     Weight 01/01/16 2233 101 lb (45.813 kg)     Height 01/01/16 2233 5' (1.524 m)     Head Cir --      Peak Flow --      Pain Score 01/01/16 2235 0   Constitutional: Alert and oriented. Moderate to severe respiratory distress. On CPAP. Eyes: Conjunctivae are normal. PERRL. Normal extraocular movements. ENT   Head: Normocephalic and atraumatic.   Nose: No congestion/rhinnorhea.   Mouth/Throat: Mucous membranes are moist.   Neck: No stridor. Hematological/Lymphatic/Immunilogical: No cervical lymphadenopathy. Cardiovascular: Normal rate, regular rhythm.  No murmurs, rubs, or gallops. Respiratory: Moderate to severe respiratory distress, on CPAP. Diffuse expiratory wheezing. Gastrointestinal: Soft and nontender. No distention. There is no CVA tenderness. Genitourinary: Deferred Musculoskeletal: Normal range of motion in all extremities. No joint effusions.  No lower extremity tenderness nor edema. Neurologic:  Normal speech and language. No gross focal neurologic deficits are appreciated.  Skin:  Skin is warm, dry and intact. No rash noted. Psychiatric: Mood and affect are normal. Speech and behavior are normal. Patient exhibits appropriate insight and judgment.  ____________________________________________    LABS (pertinent positives/negatives)  Labs Reviewed  CBC WITH DIFFERENTIAL/PLATELET - Abnormal; Notable for the following:    WBC 19.2 (*)    RBC 3.55  (*)    Hemoglobin 9.0 (*)    HCT 29.2 (*)    MCH 25.4 (*)    MCHC 31.0 (*)    RDW 18.1 (*)    Neutro Abs 14.2 (*)    Monocytes Absolute 1.4 (*)    Eosinophils Absolute 0.8 (*)    Basophils Absolute 0.2 (*)    All other components within normal limits  BASIC METABOLIC PANEL  BRAIN NATRIURETIC PEPTIDE  TROPONIN I     ____________________________________________   EKG  I, Phineas Semen, attending physician, personally viewed and interpreted this EKG  EKG Time: 2229 Rate: 101 Rhythm: sinus tachycardia Axis: normal Intervals: qtc 487 QRS: incomplete RBBB ST changes: no st elevation Impression: abnormal ekg   ____________________________________________    RADIOLOGY  cxr IMPRESSION: Findings consistent with mild CHF.   ____________________________________________   PROCEDURES  Procedure(s) performed: None  Critical Care performed: Yes, see critical care note(s)  CRITICAL CARE Performed by: Phineas Semen   Total critical care time: 30 minutes  Critical care time was exclusive of separately billable procedures and treating other patients.  Critical care was necessary to treat or prevent imminent or life-threatening deterioration.  Critical care was time spent personally by me on the following activities: development of treatment plan with patient and/or surrogate as well as nursing, discussions with consultants, evaluation of patient's response to treatment, examination of patient, obtaining history from patient or surrogate, ordering and performing treatments and interventions, ordering and review of laboratory studies, ordering and review of radiographic studies, pulse oximetry and re-evaluation of patient's condition.  ____________________________________________   INITIAL IMPRESSION / ASSESSMENT AND PLAN / ED COURSE  Pertinent labs & imaging results that were available during my care of the  patient were reviewed by me and considered in my medical  decision making (see chart for details).  Patient presented to the emergency department today because of concerns for history distress. Patient was in severe moderate respiratory distress when she got her on CPAP. We transferred her over to our BiPAP and gave DuoNeb through that for a total of 3 DuoNeb treatments. Additionally patient was given steroids given the wheezing and concern for COPD exacerbation. Patient did clinically improve on the BiPAP. She did already receive azithromycin today. Chest x-ray showed a little bit of edema so Lasix was ordered. Patient will be admitted to the hospital service.  ____________________________________________   FINAL CLINICAL IMPRESSION(S) / ED DIAGNOSES  Final diagnoses:  Respiratory distress  Congestive heart failure, unspecified congestive heart failure chronicity, unspecified congestive heart failure type (HCC)  Chronic obstructive pulmonary disease, unspecified COPD type (HCC)     Phineas Semen, MD 01/02/16 0010

## 2016-01-01 NOTE — ED Notes (Signed)
Pt to ER via EMS from home with reports of resp distress and decreased LOC.  Pt arrives on CPaP, per EMS mild increase in LOC since CPaP.

## 2016-01-02 ENCOUNTER — Encounter: Payer: Self-pay | Admitting: Student

## 2016-01-02 DIAGNOSIS — T380X5A Adverse effect of glucocorticoids and synthetic analogues, initial encounter: Secondary | ICD-10-CM | POA: Diagnosis not present

## 2016-01-02 DIAGNOSIS — F419 Anxiety disorder, unspecified: Secondary | ICD-10-CM | POA: Diagnosis not present

## 2016-01-02 DIAGNOSIS — E1122 Type 2 diabetes mellitus with diabetic chronic kidney disease: Secondary | ICD-10-CM | POA: Diagnosis not present

## 2016-01-02 DIAGNOSIS — J441 Chronic obstructive pulmonary disease with (acute) exacerbation: Secondary | ICD-10-CM | POA: Diagnosis not present

## 2016-01-02 DIAGNOSIS — L8990 Pressure ulcer of unspecified site, unspecified stage: Secondary | ICD-10-CM | POA: Diagnosis not present

## 2016-01-02 DIAGNOSIS — I5043 Acute on chronic combined systolic (congestive) and diastolic (congestive) heart failure: Secondary | ICD-10-CM

## 2016-01-02 DIAGNOSIS — J9601 Acute respiratory failure with hypoxia: Secondary | ICD-10-CM | POA: Diagnosis present

## 2016-01-02 DIAGNOSIS — E860 Dehydration: Secondary | ICD-10-CM | POA: Diagnosis not present

## 2016-01-02 DIAGNOSIS — I2109 ST elevation (STEMI) myocardial infarction involving other coronary artery of anterior wall: Secondary | ICD-10-CM | POA: Diagnosis not present

## 2016-01-02 DIAGNOSIS — Z7984 Long term (current) use of oral hypoglycemic drugs: Secondary | ICD-10-CM | POA: Diagnosis not present

## 2016-01-02 DIAGNOSIS — I5023 Acute on chronic systolic (congestive) heart failure: Secondary | ICD-10-CM | POA: Diagnosis not present

## 2016-01-02 DIAGNOSIS — Z515 Encounter for palliative care: Secondary | ICD-10-CM | POA: Diagnosis not present

## 2016-01-02 DIAGNOSIS — I739 Peripheral vascular disease, unspecified: Secondary | ICD-10-CM | POA: Diagnosis not present

## 2016-01-02 DIAGNOSIS — Z23 Encounter for immunization: Secondary | ICD-10-CM | POA: Diagnosis not present

## 2016-01-02 DIAGNOSIS — E870 Hyperosmolality and hypernatremia: Secondary | ICD-10-CM | POA: Diagnosis not present

## 2016-01-02 DIAGNOSIS — E119 Type 2 diabetes mellitus without complications: Secondary | ICD-10-CM

## 2016-01-02 DIAGNOSIS — R633 Feeding difficulties: Secondary | ICD-10-CM | POA: Diagnosis not present

## 2016-01-02 DIAGNOSIS — N179 Acute kidney failure, unspecified: Secondary | ICD-10-CM | POA: Diagnosis not present

## 2016-01-02 DIAGNOSIS — Z87891 Personal history of nicotine dependence: Secondary | ICD-10-CM | POA: Diagnosis not present

## 2016-01-02 DIAGNOSIS — Z8249 Family history of ischemic heart disease and other diseases of the circulatory system: Secondary | ICD-10-CM | POA: Diagnosis not present

## 2016-01-02 DIAGNOSIS — J440 Chronic obstructive pulmonary disease with acute lower respiratory infection: Secondary | ICD-10-CM | POA: Diagnosis not present

## 2016-01-02 DIAGNOSIS — J9621 Acute and chronic respiratory failure with hypoxia: Secondary | ICD-10-CM | POA: Diagnosis present

## 2016-01-02 DIAGNOSIS — Z7982 Long term (current) use of aspirin: Secondary | ICD-10-CM | POA: Diagnosis not present

## 2016-01-02 DIAGNOSIS — E872 Acidosis: Secondary | ICD-10-CM | POA: Diagnosis not present

## 2016-01-02 DIAGNOSIS — F0390 Unspecified dementia without behavioral disturbance: Secondary | ICD-10-CM | POA: Diagnosis present

## 2016-01-02 DIAGNOSIS — D638 Anemia in other chronic diseases classified elsewhere: Secondary | ICD-10-CM | POA: Diagnosis not present

## 2016-01-02 DIAGNOSIS — Z66 Do not resuscitate: Secondary | ICD-10-CM | POA: Diagnosis not present

## 2016-01-02 DIAGNOSIS — I2510 Atherosclerotic heart disease of native coronary artery without angina pectoris: Secondary | ICD-10-CM | POA: Diagnosis not present

## 2016-01-02 DIAGNOSIS — I214 Non-ST elevation (NSTEMI) myocardial infarction: Secondary | ICD-10-CM | POA: Diagnosis not present

## 2016-01-02 DIAGNOSIS — R0600 Dyspnea, unspecified: Secondary | ICD-10-CM | POA: Diagnosis present

## 2016-01-02 DIAGNOSIS — I480 Paroxysmal atrial fibrillation: Secondary | ICD-10-CM | POA: Diagnosis not present

## 2016-01-02 DIAGNOSIS — T502X5A Adverse effect of carbonic-anhydrase inhibitors, benzothiadiazides and other diuretics, initial encounter: Secondary | ICD-10-CM | POA: Diagnosis not present

## 2016-01-02 DIAGNOSIS — Z9981 Dependence on supplemental oxygen: Secondary | ICD-10-CM | POA: Diagnosis not present

## 2016-01-02 DIAGNOSIS — E1165 Type 2 diabetes mellitus with hyperglycemia: Secondary | ICD-10-CM | POA: Diagnosis not present

## 2016-01-02 DIAGNOSIS — J189 Pneumonia, unspecified organism: Secondary | ICD-10-CM | POA: Diagnosis present

## 2016-01-02 DIAGNOSIS — I130 Hypertensive heart and chronic kidney disease with heart failure and stage 1 through stage 4 chronic kidney disease, or unspecified chronic kidney disease: Secondary | ICD-10-CM | POA: Diagnosis present

## 2016-01-02 DIAGNOSIS — N184 Chronic kidney disease, stage 4 (severe): Secondary | ICD-10-CM | POA: Diagnosis not present

## 2016-01-02 LAB — BASIC METABOLIC PANEL
Anion gap: 11 (ref 5–15)
Anion gap: 11 (ref 5–15)
BUN: 49 mg/dL — AB (ref 6–20)
BUN: 54 mg/dL — AB (ref 6–20)
CALCIUM: 8.3 mg/dL — AB (ref 8.9–10.3)
CO2: 21 mmol/L — ABNORMAL LOW (ref 22–32)
CO2: 22 mmol/L (ref 22–32)
Calcium: 8.4 mg/dL — ABNORMAL LOW (ref 8.9–10.3)
Chloride: 107 mmol/L (ref 101–111)
Chloride: 104 mmol/L (ref 101–111)
Creatinine, Ser: 2.03 mg/dL — ABNORMAL HIGH (ref 0.44–1.00)
Creatinine, Ser: 2.14 mg/dL — ABNORMAL HIGH (ref 0.44–1.00)
GFR calc Af Amer: 24 mL/min — ABNORMAL LOW (ref >60)
GFR calc Af Amer: 23 mL/min — ABNORMAL LOW (ref >60)
GFR, EST NON AFRICAN AMERICAN: 21 mL/min — AB (ref >60)
GFR, EST NON AFRICAN AMERICAN: 19 mL/min — AB (ref >60)
GLUCOSE: 260 mg/dL — AB (ref 65–99)
Glucose, Bld: 385 mg/dL — ABNORMAL HIGH (ref 65–99)
POTASSIUM: 4.6 mmol/L (ref 3.5–5.1)
POTASSIUM: 4.3 mmol/L (ref 3.5–5.1)
SODIUM: 137 mmol/L (ref 135–145)
Sodium: 139 mmol/L (ref 135–145)

## 2016-01-02 LAB — TROPONIN I
TROPONIN I: 0.13 ng/mL — AB (ref <0.031)
Troponin I: 0.03 ng/mL (ref <0.031)
Troponin I: 0.07 ng/mL — ABNORMAL HIGH (ref <0.031)
Troponin I: 0.11 ng/mL — ABNORMAL HIGH (ref <0.031)

## 2016-01-02 LAB — URINALYSIS COMPLETE WITH MICROSCOPIC (ARMC ONLY)
BACTERIA UA: NONE SEEN
Bilirubin Urine: NEGATIVE
Glucose, UA: NEGATIVE mg/dL
HGB URINE DIPSTICK: NEGATIVE
KETONES UR: NEGATIVE mg/dL
LEUKOCYTES UA: NEGATIVE
NITRITE: NEGATIVE
PH: 5 (ref 5.0–8.0)
PROTEIN: 30 mg/dL — AB
SPECIFIC GRAVITY, URINE: 1.01 (ref 1.005–1.030)
Squamous Epithelial / LPF: NONE SEEN

## 2016-01-02 LAB — CBC
HEMATOCRIT: 27.8 % — AB (ref 35.0–47.0)
Hemoglobin: 8.6 g/dL — ABNORMAL LOW (ref 12.0–16.0)
MCH: 25.7 pg — ABNORMAL LOW (ref 26.0–34.0)
MCHC: 31.1 g/dL — ABNORMAL LOW (ref 32.0–36.0)
MCV: 82.7 fL (ref 80.0–100.0)
PLATELETS: 209 10*3/uL (ref 150–440)
RBC: 3.37 MIL/uL — AB (ref 3.80–5.20)
RDW: 17.8 % — ABNORMAL HIGH (ref 11.5–14.5)
WBC: 10.7 10*3/uL (ref 3.6–11.0)

## 2016-01-02 LAB — GLUCOSE, CAPILLARY
GLUCOSE-CAPILLARY: 118 mg/dL — AB (ref 65–99)
Glucose-Capillary: 358 mg/dL — ABNORMAL HIGH (ref 65–99)
Glucose-Capillary: 252 mg/dL — ABNORMAL HIGH (ref 65–99)
Glucose-Capillary: 123 mg/dL — ABNORMAL HIGH (ref 65–99)

## 2016-01-02 LAB — CREATININE, SERUM
Creatinine, Ser: 2.41 mg/dL — ABNORMAL HIGH (ref 0.44–1.00)
GFR, EST AFRICAN AMERICAN: 20 mL/min — AB (ref >60)
GFR, EST NON AFRICAN AMERICAN: 17 mL/min — AB (ref >60)

## 2016-01-02 LAB — HEMOGLOBIN A1C: HEMOGLOBIN A1C: 6.6 % — AB (ref 4.0–6.0)

## 2016-01-02 LAB — BRAIN NATRIURETIC PEPTIDE: B Natriuretic Peptide: 316 pg/mL — ABNORMAL HIGH (ref 0.0–100.0)

## 2016-01-02 MED ORDER — ASPIRIN EC 81 MG PO TBEC
81.0000 mg | DELAYED_RELEASE_TABLET | Freq: Every day | ORAL | Status: DC
  Administered 2016-01-02 – 2016-01-03 (×2): 81 mg via ORAL
  Filled 2016-01-02 (×2): qty 1

## 2016-01-02 MED ORDER — INSULIN ASPART 100 UNIT/ML ~~LOC~~ SOLN
0.0000 [IU] | Freq: Three times a day (TID) | SUBCUTANEOUS | Status: DC
  Administered 2016-01-02: 9 [IU] via SUBCUTANEOUS
  Administered 2016-01-02: 5 [IU] via SUBCUTANEOUS
  Administered 2016-01-03 (×2): 3 [IU] via SUBCUTANEOUS
  Administered 2016-01-04: 2 [IU] via SUBCUTANEOUS
  Administered 2016-01-04: 5 [IU] via SUBCUTANEOUS
  Administered 2016-01-04: 2 [IU] via SUBCUTANEOUS
  Administered 2016-01-05 – 2016-01-06 (×5): 3 [IU] via SUBCUTANEOUS
  Administered 2016-01-06: 2 [IU] via SUBCUTANEOUS
  Administered 2016-01-07: 3 [IU] via SUBCUTANEOUS
  Filled 2016-01-02: qty 2
  Filled 2016-01-02 (×2): qty 3
  Filled 2016-01-02: qty 9
  Filled 2016-01-02 (×2): qty 3
  Filled 2016-01-02: qty 5
  Filled 2016-01-02 (×4): qty 3
  Filled 2016-01-02: qty 2
  Filled 2016-01-02: qty 5

## 2016-01-02 MED ORDER — PAROXETINE HCL 10 MG PO TABS
20.0000 mg | ORAL_TABLET | Freq: Every day | ORAL | Status: DC
  Administered 2016-01-02 – 2016-01-06 (×5): 20 mg via ORAL
  Filled 2016-01-02 (×5): qty 1

## 2016-01-02 MED ORDER — SIMVASTATIN 20 MG PO TABS
20.0000 mg | ORAL_TABLET | Freq: Every day | ORAL | Status: DC
  Administered 2016-01-02 – 2016-01-05 (×4): 20 mg via ORAL
  Filled 2016-01-02 (×4): qty 1

## 2016-01-02 MED ORDER — TIOTROPIUM BROMIDE MONOHYDRATE 18 MCG IN CAPS
1.0000 | ORAL_CAPSULE | Freq: Every day | RESPIRATORY_TRACT | Status: DC
  Administered 2016-01-02 – 2016-01-06 (×5): 18 ug via RESPIRATORY_TRACT
  Filled 2016-01-02: qty 5

## 2016-01-02 MED ORDER — ACETAMINOPHEN 325 MG PO TABS: 650.0000 mg | ORAL_TABLET | Freq: Four times a day (QID) | ORAL | Status: DC | PRN

## 2016-01-02 MED ORDER — HYDROCODONE-ACETAMINOPHEN 5-325 MG PO TABS
1.0000 | ORAL_TABLET | Freq: Four times a day (QID) | ORAL | Status: DC | PRN
  Administered 2016-01-02: 1 via ORAL
  Filled 2016-01-02: qty 1

## 2016-01-02 MED ORDER — IPRATROPIUM-ALBUTEROL 0.5-2.5 (3) MG/3ML IN SOLN
3.0000 mL | RESPIRATORY_TRACT | Status: DC | PRN
  Administered 2016-01-02: 3 mL via RESPIRATORY_TRACT
  Filled 2016-01-02: qty 3

## 2016-01-02 MED ORDER — ACETAMINOPHEN 650 MG RE SUPP
650.0000 mg | Freq: Four times a day (QID) | RECTAL | Status: DC | PRN
  Administered 2016-01-08: 650 mg via RECTAL
  Filled 2016-01-02: qty 1

## 2016-01-02 MED ORDER — METOPROLOL TARTRATE 25 MG PO TABS
25.0000 mg | ORAL_TABLET | Freq: Two times a day (BID) | ORAL | Status: DC
  Administered 2016-01-02 – 2016-01-03 (×4): 25 mg via ORAL
  Filled 2016-01-02 (×4): qty 1

## 2016-01-02 MED ORDER — INSULIN ASPART 100 UNIT/ML ~~LOC~~ SOLN
0.0000 [IU] | Freq: Every day | SUBCUTANEOUS | Status: DC
  Administered 2016-01-03: 100 [IU] via SUBCUTANEOUS
  Administered 2016-01-05: 3 [IU] via SUBCUTANEOUS
  Administered 2016-01-06: 4 [IU] via SUBCUTANEOUS
  Filled 2016-01-02: qty 2
  Filled 2016-01-02: qty 4
  Filled 2016-01-02 (×2): qty 3

## 2016-01-02 MED ORDER — CETYLPYRIDINIUM CHLORIDE 0.05 % MT LIQD
7.0000 mL | Freq: Two times a day (BID) | OROMUCOSAL | Status: DC
  Administered 2016-01-02 – 2016-01-09 (×13): 7 mL via OROMUCOSAL

## 2016-01-02 MED ORDER — PANTOPRAZOLE SODIUM 40 MG PO TBEC
40.0000 mg | DELAYED_RELEASE_TABLET | Freq: Every day | ORAL | Status: DC
  Administered 2016-01-02 – 2016-01-05 (×4): 40 mg via ORAL
  Filled 2016-01-02 (×4): qty 1

## 2016-01-02 MED ORDER — HEPARIN SODIUM (PORCINE) 5000 UNIT/ML IJ SOLN
5000.0000 [IU] | Freq: Three times a day (TID) | INTRAMUSCULAR | Status: DC
  Administered 2016-01-02 – 2016-01-04 (×7): 5000 [IU] via SUBCUTANEOUS
  Filled 2016-01-02 (×7): qty 1

## 2016-01-02 MED ORDER — PREDNISONE 20 MG PO TABS
40.0000 mg | ORAL_TABLET | Freq: Every day | ORAL | Status: DC
  Administered 2016-01-03 – 2016-01-06 (×4): 40 mg via ORAL
  Filled 2016-01-02 (×4): qty 2

## 2016-01-02 MED ORDER — DIAZEPAM 5 MG PO TABS
10.0000 mg | ORAL_TABLET | Freq: Every day | ORAL | Status: DC | PRN
  Administered 2016-01-02: 10 mg via ORAL
  Filled 2016-01-02: qty 2

## 2016-01-02 MED ORDER — SODIUM CHLORIDE 0.9% FLUSH
3.0000 mL | Freq: Two times a day (BID) | INTRAVENOUS | Status: DC
  Administered 2016-01-02 – 2016-01-11 (×15): 3 mL via INTRAVENOUS

## 2016-01-02 MED ORDER — FUROSEMIDE 10 MG/ML IJ SOLN
40.0000 mg | Freq: Once | INTRAMUSCULAR | Status: AC
  Administered 2016-01-02: 40 mg via INTRAVENOUS
  Filled 2016-01-02: qty 4

## 2016-01-02 MED ORDER — AZITHROMYCIN 250 MG PO TABS
250.0000 mg | ORAL_TABLET | Freq: Every day | ORAL | Status: AC
  Administered 2016-01-02 – 2016-01-04 (×3): 250 mg via ORAL
  Filled 2016-01-02 (×3): qty 1

## 2016-01-02 MED ORDER — METHYLPREDNISOLONE SODIUM SUCC 125 MG IJ SOLR: 60.0000 mg | Freq: Two times a day (BID) | INTRAMUSCULAR | Status: DC

## 2016-01-02 MED ORDER — ONDANSETRON HCL 4 MG PO TABS: 4.0000 mg | ORAL_TABLET | Freq: Four times a day (QID) | ORAL | Status: DC | PRN

## 2016-01-02 MED ORDER — ONDANSETRON HCL 4 MG/2ML IJ SOLN
4.0000 mg | Freq: Four times a day (QID) | INTRAMUSCULAR | Status: DC | PRN
  Filled 2016-01-02: qty 2

## 2016-01-02 MED ORDER — INSULIN GLARGINE 100 UNIT/ML ~~LOC~~ SOLN
10.0000 [IU] | Freq: Every day | SUBCUTANEOUS | Status: DC
  Administered 2016-01-02 – 2016-01-05 (×4): 10 [IU] via SUBCUTANEOUS
  Filled 2016-01-02 (×4): qty 0.1

## 2016-01-02 MED ORDER — INFLUENZA VAC SPLIT QUAD 0.5 ML IM SUSY
0.5000 mL | PREFILLED_SYRINGE | INTRAMUSCULAR | Status: AC
  Administered 2016-01-04: 0.5 mL via INTRAMUSCULAR
  Filled 2016-01-02: qty 0.5

## 2016-01-02 MED ORDER — FUROSEMIDE 40 MG PO TABS
40.0000 mg | ORAL_TABLET | Freq: Every day | ORAL | Status: DC
  Administered 2016-01-02: 40 mg via ORAL
  Filled 2016-01-02: qty 1

## 2016-01-02 NOTE — Progress Notes (Signed)
Inpatient Diabetes Program Recommendations  AACE/ADA: New Consensus Statement on Inpatient Glycemic Control (2015)  Target Ranges:  Prepandial:   less than 140 mg/dL      Peak postprandial:   less than 180 mg/dL (1-2 hours)      Critically ill patients:  140 - 180 mg/dL  Results for BRYNNLEY, KHANG (MRN [id number]) as of 01/02/2016 09:37  Ref. Range 01/02/2016 07:37  Glucose-Capillary Latest Ref Range: 65-99 mg/dL 016 (H)  Results for JOVIA, HENRETTY (MRN [id number]) as of 01/02/2016 09:37  Ref. Range 01/01/2016 22:47 01/02/2016 05:27  Glucose Latest Ref Range: 65-99 mg/dL 786 (H) 754 (H)   Review of Glycemic Control  Diabetes history: DM2 Outpatient Diabetes medications: Glipizide XL 10 mg daily, Metformin 1000 mg QAM, Metformin 500 mg QPM Current orders for Inpatient glycemic control: Novolog 0-9 units TID with meals, Novolog 0-5 units QHS  Inpatient Diabetes Program Recommendations: Insulin - Basal: Fasting glucose 358 mg/dl this morning. While inpatient and ordered steroids (currently ordered: Solumedrol 60 mg Q12H), please consider ordering Lantus 10 units Q24H starting now.  Thanks, Orlando Penner, RN, MSN, CDE Diabetes Coordinator Inpatient Diabetes Program [phone] (Team Pager from 8am to 5pm) [phone] ([facility] office) [phone] [facility] office) [phone] [facility] office)

## 2016-01-02 NOTE — ED Notes (Signed)
Pts O2 sats at 1005 on 2L O2.  Pt talking w/o difficulty at this time.

## 2016-01-02 NOTE — H&P (Addendum)
[facility] [place] [facility]   PATIENT NAME: Laura Jefferson    MR#:  [id number]  DATE OF BIRTH:  1925-11-21  DATE OF ADMISSION:  01/01/2016  PRIMARY CARE PHYSICIAN: Jerl Mina, MD   REQUESTING/REFERRING PHYSICIAN: Derrill Kay, M.D.  CHIEF COMPLAINT:   Chief Complaint  Patient presents with  . Respiratory Distress    HISTORY OF PRESENT ILLNESS:  Laura Jefferson  is a 82 y.o. female who presents with acute respiratory failure. Patient is on BiPAP, and is unable to provide much of her own history today. Her daughter is at bedside with her in the ED and provides much of the history. Her daughter is her caregiver at home. She states that for the past week or so she's been having progressive symptoms, slowly increasing shortness of breath and fatigue. As well as some increasing orthopnea. She went to her outpatient physician and was given a Z-Pak, which she has taken about half, with the thought that this might be due to her COPD. However, tonight she had acutely worse and they brought her to the ED for evaluation. She was hypoxic on arrival, chest x-rays consistent with basilar congestion and CHF. Patient does have an elevated white blood cell count at 19, though she denies any infectious symptoms. Chest x-ray did not show any pneumonia. Patient required BiPAP in order to improve her O2 sats. Hospitalists were called for admission.  PAST MEDICAL HISTORY:   Past Medical History  Diagnosis Date  . Muscle weakness   . Non-ST elevated myocardial infarction [facility])     a. likely demand ischemia 02/2015 in the setting of ARF and acute on chronic CHF  . Humerus fracture     a. left, b. 02/2015; c. in the setting of increased weakness  . Dysphagia   . Anemia   . Paroxysmal a-fib (HCC)     a. reported a-fib, no documented EKGs showing this  . Chronic systolic CHF (congestive heart failure) (HCC)     a. echo 02/2015: EF 40-45%, mod dilated LA, mildly dilated RA, mod MR/TR,  mildly elevated PASP 43 mm Hg, no evidence of intracardiac shunting, intact interatrial and interventricular septa  . COPD (chronic obstructive pulmonary disease) (HCC)     a. on home O2  . Hypertensive chronic kidney disease   . Diabetes mellitus without complication (HCC)   . PVD (peripheral vascular disease) (HCC)   . Dementia   . Anxiety disorder     PAST SURGICAL HISTORY:   Past Surgical History  Procedure Laterality Date  . Arm surgery    . Peripheral arterial stent graft    . Esophagogastroduodenoscopy (egd) with propofol N/A 11/06/2015    Procedure: ESOPHAGOGASTRODUODENOSCOPY (EGD) WITH PROPOFOL;  Surgeon: Elnita Maxwell, MD;  Location: [facility] ENDOSCOPY;  Service: Endoscopy;  Laterality: N/A;    SOCIAL HISTORY:   Social History  Substance Use Topics  . Smoking status: Former Smoker -- 70 years    Types: Cigarettes    Quit date: 02/24/2015  . Smokeless tobacco: Not on file  . Alcohol Use: No    FAMILY HISTORY:   Family History  Problem Relation Age of Onset  . CAD Son     DRUG ALLERGIES:  No Known Allergies  MEDICATIONS AT HOME:   Prior to Admission medications   Medication Sig Start Date End Date Taking? Authorizing Provider  albuterol (PROVENTIL HFA;VENTOLIN HFA) 108 (90 BASE) MCG/ACT inhaler Inhale 1 puff into the lungs every 6 (six) hours as needed for  wheezing or shortness of breath.   Yes Historical Provider, MD  aspirin EC 81 MG tablet Take 81 mg by mouth at bedtime.   Yes Historical Provider, MD  azithromycin (ZITHROMAX) 250 MG tablet Take 1 tablet by mouth daily. 12/30/15  Yes Historical Provider, MD  Cholecalciferol (VITAMIN D) 2000 UNITS tablet Take 2,000 Units by mouth daily.   Yes Historical Provider, MD  diazepam (VALIUM) 10 MG tablet Take 10 mg by mouth daily as needed for anxiety.   Yes Historical Provider, MD  docusate sodium (COLACE) 100 MG capsule Take 100-200 mg by mouth 2 (two) times daily as needed for mild constipation.   Yes Historical  Provider, MD  folic acid (FOLVITE) 1 MG tablet Take 1 mg by mouth at bedtime.   Yes Historical Provider, MD  furosemide (LASIX) 40 MG tablet Take 40 mg by mouth daily.    Yes Historical Provider, MD  glipiZIDE (GLUCOTROL XL) 10 MG 24 hr tablet Take 10 mg by mouth daily.   Yes Historical Provider, MD  HYDROcodone-acetaminophen (NORCO/VICODIN) 5-325 MG per tablet Take 1 tablet by mouth every 6 (six) hours as needed for moderate pain.   Yes Historical Provider, MD  metFORMIN (GLUCOPHAGE) 500 MG tablet Take by mouth. Takes 1000 mg am and 500 mg pm daily.   Yes Historical Provider, MD  metoprolol tartrate (LOPRESSOR) 25 MG tablet Take 25 mg by mouth 2 (two) times daily.   Yes Historical Provider, MD  Omeprazole (PRILOSEC PO) Take by mouth daily.   Yes Historical Provider, MD  omeprazole (PRILOSEC) 40 MG capsule Take 40 mg by mouth daily. 12/16/15  Yes Historical Provider, MD  PARoxetine (PAXIL) 20 MG tablet Take 20 mg by mouth daily.   Yes Historical Provider, MD  predniSONE (DELTASONE) 10 MG tablet Take 10 mg by mouth See admin instructions. Take 4 tablets everyday for 3 days, taper down. 12/30/15  Yes Historical Provider, MD  psyllium (METAMUCIL) 58.6 % packet Take 1 packet by mouth daily as needed (for constipation).   Yes Historical Provider, MD  risperiDONE (RISPERDAL) 0.5 MG tablet Take 0.5 mg by mouth at bedtime.    Yes Historical Provider, MD  simvastatin (ZOCOR) 20 MG tablet Take 20 mg by mouth at bedtime.    Yes Historical Provider, MD  Tiotropium Bromide Monohydrate 2.5 MCG/ACT AERS Inhale 2 puffs into the lungs daily.    Yes Historical Provider, MD  vitamin B-12 (CYANOCOBALAMIN) 1000 MCG tablet Take 1,000 mcg by mouth daily.   Yes Historical Provider, MD    REVIEW OF SYSTEMS:  Review of Systems  Constitutional: Positive for malaise/fatigue. Negative for fever, chills and weight loss.  HENT: Negative for ear pain, hearing loss and tinnitus.   Eyes: Negative for blurred vision, double  vision, pain and redness.  Respiratory: Positive for shortness of breath. Negative for cough, hemoptysis and wheezing.   Cardiovascular: Positive for orthopnea. Negative for chest pain, palpitations and leg swelling.  Gastrointestinal: Negative for nausea, vomiting, abdominal pain, diarrhea and constipation.  Genitourinary: Negative for dysuria, frequency and hematuria.  Musculoskeletal: Negative for back pain, joint pain and neck pain.  Skin:       No acne, rash, or lesions  Neurological: Negative for dizziness, tremors, focal weakness and weakness.  Endo/Heme/Allergies: Negative for polydipsia. Does not bruise/bleed easily.  Psychiatric/Behavioral: Negative for depression. The patient is not nervous/anxious and does not have insomnia.      VITAL SIGNS:   Filed Vitals:   01/01/16 2230 01/01/16 2233 01/01/16 2330  BP:  127/65   Pulse:  98 88  Temp:  97.7 F (36.5 C)   TempSrc:  Axillary   Resp:  25 30  Height:  5' (1.524 m)   Weight:  45.813 kg (101 lb)   SpO2: 100% 100% 100%   Wt Readings from Last 3 Encounters:  01/01/16 45.813 kg (101 lb)  12/30/15 48.195 kg (106 lb 4 oz)  11/06/15 47.628 kg (105 lb)    PHYSICAL EXAMINATION:  Physical Exam  Vitals reviewed. Constitutional: She is oriented to person, place, and time. She appears well-developed and well-nourished. No distress.  HENT:  Head: Normocephalic and atraumatic.  Mouth/Throat: Oropharynx is clear and moist.  Eyes: Conjunctivae and EOM are normal. Pupils are equal, round, and reactive to light. No scleral icterus.  Neck: Normal range of motion. Neck supple. No JVD present. No thyromegaly present.  Cardiovascular: Normal rate, regular rhythm and intact distal pulses.  Exam reveals no gallop and no friction rub.   No murmur heard. Respiratory: She is in respiratory distress. She has no wheezes. She has rales.  GI: Soft. Bowel sounds are normal. She exhibits no distension. There is no tenderness.  Musculoskeletal:  Normal range of motion. She exhibits no edema.  No arthritis, no gout  Lymphadenopathy:    She has no cervical adenopathy.  Neurological: She is alert and oriented to person, place, and time. No cranial nerve deficit.  No dysarthria, no aphasia  Skin: Skin is warm and dry. No rash noted. No erythema.  Psychiatric: She has a normal mood and affect. Her behavior is normal. Judgment and thought content normal.    LABORATORY PANEL:   CBC  Recent Labs Lab 01/01/16 2247  WBC 19.2*  HGB 9.0*  HCT 29.2*  PLT 243   ------------------------------------------------------------------------------------------------------------------  Chemistries   Recent Labs Lab 01/01/16 2247  NA 139  K 4.6  CL 107  CO2 21*  GLUCOSE 260*  BUN 49*  CREATININE 2.03*  CALCIUM 8.4*   ------------------------------------------------------------------------------------------------------------------  Cardiac Enzymes  Recent Labs Lab 01/01/16 2247  TROPONINI 0.03   ------------------------------------------------------------------------------------------------------------------  RADIOLOGY:  Dg Chest Port 1 View  01/01/2016  CLINICAL DATA:  Respiratory distress.  Shortness of breath. EXAM: PORTABLE CHEST 1 VIEW COMPARISON:  07/22/2015 FINDINGS: Stable cardiomegaly. Mild pulmonary edema, with decreased in the interim. Small pleural effusions suspected. No confluent airspace disease. No pneumothorax. Chronic changes about shoulders. IMPRESSION: Findings consistent with mild CHF. Electronically Signed   By: Rubye Oaks M.D.   On: 01/01/2016 23:24    EKG:   Orders placed or performed during the hospital encounter of 01/01/16  . EKG 12-Lead  . EKG 12-Lead    IMPRESSION AND PLAN:  Principal Problem:   Acute on chronic respiratory failure with hypoxia (HCC) - likely due to her heart failure, though cannot exclude a component of COPD exacerbation as well. Patient has stabilized on BiPAP. We will  continue this until she is able to wean as tolerated. We'll admit her with telemetry and continuous pulse ox. Treatment of other problems as below. Active Problems:   Acute on chronic combined systolic and diastolic CHF (congestive heart failure) (HCC) - BNP is somewhat elevated, chest x-ray consistent with heart failure. She was given IV Lasix in the ED, we'll continue this for at least 1 more dose and reassess. Her troponin was 0.03, we will trend for 3 total sets. Cardiology consult placed   COPD exacerbation (HCC) - we'll continue her Z-Pak which she started outpatient. Duo nebs when  necessary. IV steroids.   Paroxysmal a-fib - currently in sinus rhythm. We'll continue her home rate controlling medications.   Type 2 diabetes mellitus (HCC) - heart healthy/carb modified diet. Sliding scale insulin with corresponding glucose checks before meals and at bedtime. Check hemoglobin A1c.   Anxiety - continue home when necessary anxiolytic.  All the records are reviewed and case discussed with ED provider. Management plans discussed with the patient and/or family.  DVT PROPHYLAXIS: Subcutaneous heparin  GI PROPHYLAXIS: PPI  ADMISSION STATUS: Inpatient  CODE STATUS: DNR Code Status History    Date Active Date Inactive Code Status Order ID Comments User Context   07/22/2015  8:01 PM 07/25/2015  4:24 PM DNR [id number]  Katharina Caper, MD ED   04/01/2015 10:53 AM 04/11/2015  7:46 PM DNR [id number]  Auburn Bilberry, MD Inpatient    Questions for Most Recent Historical Code Status (Order [id number])    Question Answer Comment   In the event of cardiac or respiratory ARREST Do not call a "code blue"    In the event of cardiac or respiratory ARREST Do not perform Intubation, CPR, defibrillation or ACLS    In the event of cardiac or respiratory ARREST Use medication by any route, position, wound care, and other measures to relive pain and suffering. May use oxygen, suction and manual treatment of airway  obstruction as needed for comfort.    Comments Discussed with patient's daughter       TOTAL CRITICAL CARE TIME TAKING CARE OF THIS PATIENT: 50 minutes.    Raivyn Kabler FIELDING 01/02/2016, 1:06 AM  [facility]  Office  [phone]  CC: Primary care physician; Jerl Mina, MD

## 2016-01-02 NOTE — Clinical Social Work Note (Addendum)
Clinical Social Work Assessment  Patient Details  Name: Laura Jefferson MRN: [id number] Date of Birth: 02-28-1925  Date of referral:  01/02/16               Reason for consult:   (possible SNF placement)                Permission sought to share information with:    Permission granted to share information::     Name::        Agency::     Relationship::     Contact Information:     Housing/Transportation Living arrangements for the past 2 months:  Single Family Home Source of Information:  Patient, Medical Team, Adult Children Patient Interpreter Needed:  None Criminal Activity/Legal Involvement Pertinent to Current Situation/Hospitalization:    Significant Relationships:  Adult Children, Pets Lives with:  Adult Children Do you feel safe going back to the place where you live?  Yes Need for family participation in patient care:     Care giving concerns:  None at this time   Office manager / plan:  Visual merchandiser (CSW) consult for possible SNF placement, PT evaluated patient, daughter in room during assessment. States patient is at her baseline.   Patient was alert, oriented and min. engaged in conversation with CSW. She is pleasantly confused  (Additional Information provided by Daughter Laura Jefferson at bedside).  CSW introduced self and explained role of CSW department.    Patient currently lives with her daughter Laura Jefferson, who has been her primary caretaker for the past 2 years and provides 24 hour care for patient.  Daughter also states her brother is a support system.  Patient is not receiving any in-home services at this time. PT is not recommending any home health PT.   Daughter inquired about community resources.  CSW provided  Daughter with information on Elder Care, Dementia support and Caregiver Resources .  No additional CSW services needed at this time.  Employment status:    Insurance informationAdvertising account executive PT Recommendations:    Information / Referral  to community resources:  Other (Comment Required), Support Groups (Elder Care, Dementia support information, Caregiving resources)  Patient/Family's Response to care:  Patient's daughter was appreciative of information provided by CSW.   Patient/Family's Understanding of and Emotional Response to Diagnosis, Current Treatment, and Prognosis:  Patient's daughter understands that she is under continued medical work up at this time.  Once medically stable patient will discharge home with daughter.  Emotional Assessment Appearance:  Appears stated age Attitude/Demeanor/Rapport:    Affect (typically observed):  Calm, Pleasant Orientation:  Oriented to Self Alcohol / Substance use:    Psych involvement (Current and /or in the community):  No (Comment)  Discharge Needs  Concerns to be addressed:  Discharge Planning Concerns, Care Coordination Readmission within the last 30 days:  No Current discharge risk:  Chronically ill, Cognitively Impaired Barriers to Discharge:  Continued Medical Work up   [employer], LCSW 01/02/2016, 3:46 PM

## 2016-01-02 NOTE — ED Notes (Signed)
Respiratory called per Admitting MD request to see if pt can come off BiPAP

## 2016-01-02 NOTE — ED Notes (Addendum)
MD Anne Hahn informed of pts sat on 2L

## 2016-01-02 NOTE — Consult Note (Signed)
Cardiology Consult    Patient ID: Laura Jefferson MRN: [id number], DOB/AGE: [birth year]   Admit date: 01/01/2016 Date of Consult: 01/02/2016  Primary Physician: Jerl Mina, MD Reason for Consult: CHF Primary Cardiologist: Dr. Mariah Milling Requesting Provider: Dr. Anne Hahn  Patient Profile    82 y.o. female w/ PMH of chronic combined systolic and diastolic CHF (EF 40-98% by echo in 02/2015), COPD (on home O2), PAF, PVD, Stage 4 CKD, and Dementia who presented to [facility] on 01/01/2016 for worsening dyspnea.  History of Present Illness    Laura Jefferson is a 82 y.o. female with past medical history of chronic combined systolic and diastolic CHF (EF 11-91% by echo in 02/2015), COPD (on 1L [place] at home), PAF, PVD, Type 2 DM, HTN, Stage 4 CKD, and Dementia who presented to [facility] on 01/01/2016 for worsening dyspnea.  She was recently seen in the office by Dr. Mariah Milling on 12/30/2015 and reported having a worsening cough and wheezing over the past 4-5 days. She was waking up during the middle of the night, gasping for air and her daughter would administer nebulizer treatments prior to bedtime to help decrease the frequency and severity of these episodes.  She was started on Prednisone and Azithromycin at that time of that visit.  She presented to the ED on 01/01/2016 via EMS for worsening respiratory distress and decreased responsiveness despite being started on steroids and Azithromycin two days prior. While in the ED, her initial troponin was negative with the second value being minimally elevated at 0.07. BNP 316. Creatinine at 2.03 (baseline 1.9 - 2.0). WBC elevated to 19.2. Hgb stable at 9.0. Platelets at 243. UA negative for evidence of a UTI. CXR showed stable cardiomegaly with mild pulmonary edema and no confluent airspace disease. She was given Lasix  IV for two doses while in the ED. Unfortunately, her output was not recorded. Her creatinine did bump up to 2.41 following administration of IV Lasix.   On  examination this morning, she is arousable but only alert to self. She will awaken when you say her name and responds but quickly goes back to sleep. I checked her oxygen saturation at the bedside and it was 99% on room air. In reviewing her medical records and talking with the patient's nurse she was agitated overnight and received Valium and Vicodin around 0430. She currently denies any chest pain or shortness of breath. She is on 1L [place] which is her home oxygen level and appears to be breathing comfortably.  Past Medical History   Past Medical History  Diagnosis Date  . Muscle weakness   . Non-ST elevated myocardial infarction [facility])     a. likely demand ischemia 02/2015 in the setting of ARF and acute on chronic CHF  . Humerus fracture     a. left, b. 02/2015; c. in the setting of increased weakness  . Dysphagia   . Anemia   . Paroxysmal a-fib (HCC)     a. reported a-fib, no documented EKGs showing this  . Chronic systolic CHF (congestive heart failure) (HCC)     a. echo 02/2015: EF 40-45%, mod dilated LA, mildly dilated RA, mod MR/TR, mildly elevated PASP 43 mm Hg, no evidence of intracardiac shunting, intact interatrial and interventricular septa  . COPD (chronic obstructive pulmonary disease) (HCC)     a. on home O2  . Hypertensive chronic kidney disease   . Diabetes mellitus without complication (HCC)   . PVD (peripheral vascular disease) (HCC)   .  Dementia   . Anxiety disorder     Past Surgical History  Procedure Laterality Date  . Arm surgery    . Peripheral arterial stent graft    . Esophagogastroduodenoscopy (egd) with propofol N/A 11/06/2015    Procedure: ESOPHAGOGASTRODUODENOSCOPY (EGD) WITH PROPOFOL;  Surgeon: Elnita Maxwell, MD;  Location: [facility] END[facility]COPY;  Service: Endoscopy;  Laterality: N/A;     Allergies: No Known Allergies  Inpatient Medications    . antiseptic oral rinse  7 mL Mouth Rinse BID  . aspirin EC  81 mg Oral QHS  . azithromycin  250 mg Oral Daily    . furosemide  40 mg Oral Daily  . heparin  5,000 Units Subcutaneous 3 times per day  . [START ON 01/03/2016] Influenza vac split quadrivalent PF  0.5 mL Intramuscular Tomorrow-1000  . insulin aspart  0-5 Units Subcutaneous QHS  . insulin aspart  0-9 Units Subcutaneous TID WC  . methylPREDNISolone sodium succinate  60 mg Intravenous Q12H  . metoprolol tartrate  25 mg Oral BID  . pantoprazole  40 mg Oral Daily  . PARoxetine  20 mg Oral Daily  . simvastatin  20 mg Oral QHS  . sodium chloride flush  3 mL Intravenous Q12H  . tiotropium  1 capsule Inhalation Daily    Family History    Family History  Problem Relation Age of Onset  . CAD Son     Social History    Social History   Social History  . Marital Status: Widowed    Spouse Name: N/A  . Number of Children: N/A  . Years of Education: N/A   Occupational History  . Not on file.   Social History Main Topics  . Smoking status: Former Smoker -- 70 years    Types: Cigarettes    Quit date: 02/24/2015  . Smokeless tobacco: Not on file  . Alcohol Use: No  . Drug Use: No  . Sexual Activity: Not on file   Other Topics Concern  . Not on file   Social History Narrative     Review of Systems    General:  No chills, fever, night sweats or weight changes.  Cardiovascular:  No chest pain, edema, or palpitations. Positive for dyspnea on exertion, orthopnea, PND. Dermatological: No rash, lesions/masses Respiratory: Positive for cough and dyspnea. Urologic: No hematuria, dysuria Abdominal:   No nausea, vomiting, diarrhea, bright red blood per rectum, melena, or hematemesis Neurologic:  No visual changes, wkns, changes in mental status. All other systems reviewed and are otherwise negative except as noted above.  Physical Exam    Blood pressure 147/56, pulse 86, temperature 97.6 F (36.4 C), temperature source Oral, resp. rate 20, height 5' (1.524 m), weight 105 lb 8 oz (47.854 kg), SpO2 94 %.  General: Pleasant, elderly  Caucasian female appearing in NAD. On 1L [place]. Psych: Arousable and responds to questions but falls back asleep relatively quickly. Neuro: Alert and oriented X 1. Moves all extremities spontaneously. HEENT: Normal  Neck: Supple without bruits or JVD. Lungs:  Resp regular and unlabored, Decreased breath sounds at the bases bilaterally. Mild rales present. No wheezing appreciated. Heart: RRR no s3, s4, or murmurs. Abdomen: Soft, non-tender, non-distended, BS + x 4.  Extremities: No clubbing, cyanosis or edema. DP/PT/Radials 2+ and equal bilaterally.  Labs    Troponin (Point of Care Test) No results for input(s): TROPIPOC in the last 72 hours.  Recent Labs  01/01/16 2247 01/02/16 0527  TROPONINI 0.03 0.07*   Lab  Results  Component Value Date   WBC 10.7 01/02/2016   HGB 8.6* 01/02/2016   HCT 27.8* 01/02/2016   MCV 82.7 01/02/2016   PLT 209 01/02/2016    Recent Labs Lab 01/02/16 0527  NA 137  K 4.3  CL 104  CO2 22  BUN 54*  CREATININE 2.41*  2.14*  CALCIUM 8.3*  GLUCOSE 385*   Lab Results  Component Value Date   CHOL 103 03/01/2015   HDL 34* 03/01/2015   LDLCALC 40 03/01/2015   TRIG 143 03/01/2015     Radiology Studies    Dg Chest Port 1 View: 01/03/2016  CLINICAL DATA:  Respiratory distress.  Shortness of breath. EXAM: PORTABLE CHEST 1 VIEW COMPARISON:  07/22/2015 FINDINGS: Stable cardiomegaly. Mild pulmonary edema, with decreased in the interim. Small pleural effusions suspected. No confluent airspace disease. No pneumothorax. Chronic changes about shoulders. IMPRESSION: Findings consistent with mild CHF. Electronically Signed   By: Rubye Oaks M.D.   On: 01/03/16 23:24    EKG & Cardiac Imaging    EKG: Sinus tachycardia, HR 104, incomplete RBBB, more prominent lateral ST depression when compared to previous tracing.  Echocardiogram: Pending  Assessment & Plan    1. Acute on Chronic Combined Systolic and Diastolic CHF - EF 40-45% by echo in 02/2015. A  repeat echocardiogram to assess LV function is pending. - BNP 316 on admission (had been elevated to 4000 three months ago) and CXR showing mild CHF. - received 2 doses of IV Lasix with bump in creatinine to 2.41. Lower extremity edema improved. Still has rales on examination. - would hold IV Lasix today, recheck BMET later today, and reassess need for IV Lasix in the AM.  2. COPD Exacerbation - on 1L [place] at home - started on Prednisone and Azithromycin as outpatient on 12/30/2015. - switched to IV Solu-Medrol at time of admission.  - per admitting team  3. PAF - reported a history of PAF but it has never been documented. Maintaining NSR at this time. - would not start anticoagulation until she has a recorded episode of PAF due to her frail appearing state and worsening dementia. No family is available at the time of this encounter to assess fall risk. It appears she was in a wheelchair at the time of her last visit with Dr. Mariah Milling. - continue Lopressor 25mg  BID.  4. Altered Mental Status/ Baseline Dementia - unsure of her baseline. She is only A&Ox1 at the time of this encounter.  - Arousable and responds to questions but quickly falls back asleep. Was given Valium and Vicodin overnight.  - would like to avoid using sedation medications going forward.   5. Stage 4 CKD - Creatinine 2.03 on admission. Baseline 1.9 - 2.0 - increased to 2.41 following administration of IV Lasix. Would hold IV Lasix for today due to worsening creatinine.  6. Type 2 DM - would expect episodes of hyperglycemia with steroid use. - per admitting team  7. HTN - BP has been 112/56 - 147/78 since admission - continue current medication regimen.  Signed, Ellsworth Lennox, PA-C 01/02/2016, 7:56 AM Pager: [phone]

## 2016-01-02 NOTE — Progress Notes (Signed)
Daughter called twice. Unable to speak with daughter at the time. I called the daughter back twice at [phone]. No answer and no voicemail available.

## 2016-01-02 NOTE — Progress Notes (Signed)
[facility] [place] [facility]   PATIENT NAME: Laura Jefferson    MR#:  [id number]  DATE OF BIRTH:  17-Jan-1925  SUBJECTIVE:  Patient admitted 01/01/16 for shortness of breath requiring BiPAP therapy emergency department, she is on 1 L nasal cannula Baseline she had evidence of congestive heart failure received IV diuresis. Clinically she has improved she is unable to provide any meaningful information given mental status/medical conditions, no complaints at this time  REVIEW OF SYSTEMS:  Unable to obtain given patient's mental status medical condition  DRUG ALLERGIES:  No Known Allergies  VITALS:  Blood pressure 147/56, pulse 86, temperature 97.6 F (36.4 C), temperature source Oral, resp. rate 20, height 5' (1.524 m), weight 47.854 kg (105 lb 8 oz), SpO2 94 %.  PHYSICAL EXAMINATION:   VITAL SIGNS: Filed Vitals:   01/02/16 0230 01/02/16 0409  BP:  147/56  Pulse: 77 86  Temp:  97.6 F (36.4 C)  Resp: 24 20   GENERAL:82 y.o.female moderate distress given mental status.  HEAD: Normocephalic, atraumatic.  EYES: Pupils equal, round, reactive to light. Unable to assess extraocular muscles given mental status/medical condition. No scleral icterus.  MOUTH: Dry mucosal membrane. Dentition intact. No abscess noted.  EAR, NOSE, THROAT: Clear without exudates. No external lesions.  NECK: Supple. No thyromegaly. No nodules. No JVD.  PULMONARY: Coarse rhonchi with scattered wheeze No use of accessory muscles, Good respiratory effort. good air entry bilaterally CHEST: Nontender to palpation.  CARDIOVASCULAR: S1 and S2. Regular rate and rhythm. No murmurs, rubs, or gallops. Trace edema. Pedal pulses 2+ bilaterally.  GASTROINTESTINAL: Soft, nontender, nondistended. No masses. Positive bowel sounds. No hepatosplenomegaly.  MUSCULOSKELETAL: No swelling, clubbing, or edema. Range of motion full in all extremities.  NEUROLOGIC: Unable to assess given mental  status/medical condition SKIN: Multiple areas of ecchymosis all extremities No ulceration, lesions, rashes, or cyanosis. Skin warm and dry. Turgor intact.  PSYCHIATRIC: Unable to assess given mental status/medical condition        LABORATORY PANEL:   CBC  Recent Labs Lab 01/02/16 0527  WBC 10.7  HGB 8.6*  HCT 27.8*  PLT 209   ------------------------------------------------------------------------------------------------------------------  Chemistries   Recent Labs Lab 01/02/16 0527  NA 137  K 4.3  CL 104  CO2 22  GLUCOSE 385*  BUN 54*  CREATININE 2.41*  2.14*  CALCIUM 8.3*   ------------------------------------------------------------------------------------------------------------------  Cardiac Enzymes  Recent Labs Lab 01/02/16 0527  TROPONINI 0.07*   ------------------------------------------------------------------------------------------------------------------  RADIOLOGY:  Dg Chest Port 1 View  01/01/2016  CLINICAL DATA:  Respiratory distress.  Shortness of breath. EXAM: PORTABLE CHEST 1 VIEW COMPARISON:  07/22/2015 FINDINGS: Stable cardiomegaly. Mild pulmonary edema, with decreased in the interim. Small pleural effusions suspected. No confluent airspace disease. No pneumothorax. Chronic changes about shoulders. IMPRESSION: Findings consistent with mild CHF. Electronically Signed   By: Rubye Oaks M.D.   On: 01/01/2016 23:24    EKG:   Orders placed or performed during the hospital encounter of 01/01/16  . EKG 12-Lead  . EKG 12-Lead    ASSESSMENT AND PLAN:  82 year old female DO NOT RESUSCITATE history of systolic congestive heart failure, COPD chronic respiratory failure 1 L nasal cannula who presented with shortness of breath respiratory failure on2/2/17.  1. Acute on chronic respiratory failure with hypoxia: Secondary to acute on chronic systolic congestive heart failure: She was received IV diuresis with adequate urine output however her  renal function has diminished would hold on further IV diuresis today Cardiology input noted  and appreciated Continuous submental oxygen, and DuoNeb treatments  2. COPD exacerbation: Supplemental oxygen, DuoNeb treatments, continue steroids given wheezing however we will decrease, continue azithromycin, continue home medications  3. Acute kidney injury on chronic kidney disease: Secondary to diuresis decreased diuresis follow urine output renal function  3. Type 2 diabetes with hyperglycemia, steroid-induced hyperglycemia: Decrease steroids as stated above diabetic note appreciated Initiate Lantus follow Accu-Cheks with sliding scale coverage likely decrease with decrease steroids  4. Essential hypertension: Lopressor 5. Anxiety not otherwise specified Valium 6. Venous thromboembolic prophylactic: Heparin subcutaneous  Disposition: We'll get physical therapy/case management as patient will likely require skilled nursing        All the records are reviewed and case discussed with Care Management/Social Workerr. Management plans discussed with the patient, family and they are in agreement.  CODE STATUS: DO NOT RESUSCITATE  TOTAL TIME TAKING CARE OF THIS PATIENT: 35 minutes.   POSSIBLE D/C IN 2-3 DAYS, DEPENDING ON CLINICAL CONDITION.   Hower,  Mardi Mainland.D on 01/02/2016 at 10:18 AM  Between 7am to 6pm - Pager - [phone]  After 6pm: House Pager: - [phone]  [facility]  Office  [phone]  CC: Primary care physician; Jerl Mina, MD

## 2016-01-02 NOTE — Progress Notes (Signed)
Removed pt from Bipap and placed her on 2L [place]. Will cont to monitor. SPO2 100%, HR 82, RR 22.

## 2016-01-02 NOTE — Progress Notes (Signed)
Patient arrived to 2A Room 250. Patient denies pain and all questions answered. Patient oriented to unit and Fall Safety Plan signed by patient's daughter due to patient's mental status. Skin assessment completed with Cassie RN; skin intact with bruises scattered throughout and minor redness noted under both breasts and saccrum. Patient A&Ox2-3 and VSS. Nursing staff will continue to monitor. Lamonte Richer, RN

## 2016-01-02 NOTE — ED Notes (Signed)
Family at bedside. 

## 2016-01-02 NOTE — Progress Notes (Signed)
Initial appointment made at the [facility] on January 23, 2016 at 10:30am. Of note, she did not show for a previous appointment on 09/18/15. Thank you.

## 2016-01-02 NOTE — Care Management (Signed)
Presented with respiratory symptoms that initially required bipap.  She has chronic 02 at home and has been followed by [facility] in the past.

## 2016-01-02 NOTE — Evaluation (Signed)
Physical Therapy Evaluation Patient Details Name: Laura Jefferson MRN: [id number] DOB: 15-Jul-1925 Today's Date: 01/02/2016   History of Present Illness  82 yo F admitted for respiratory failure/ SOB, having experienced increasing hypoxia and congestion.  Clinical Impression  Pt presents with generalized weakness and requires +1 A for mobility with FWW for limited distances. SpO2 remained 95% or above during session and no c/o dizziness of SOB during session. PT recommended 24 hr A at discharge and daughter states they are equipped at home and she is with the pt 24 hr per day and at pt's side for all mobility. Daughter confident that she will be able to handle caring for her mother. Daughter also states that she doesn't wish for HHPT as the pt is "not far from baseline".    Follow Up Recommendations Supervision/Assistance - 24 hour    Equipment Recommendations       Recommendations for Other Services       Precautions / Restrictions Precautions Precautions: None Restrictions Weight Bearing Restrictions: No      Mobility  Bed Mobility Overal bed mobility: Needs Assistance Bed Mobility: Supine to Sit     Supine to sit: Min assist     General bed mobility comments: Requires VC for technique and use of rail and to scoot to EOB.  Transfers Overall transfer level: Needs assistance Equipment used: Rolling walker (2 wheeled) Transfers: Sit to/from [employer] Sit to Stand: Mod assist Stand pivot transfers: Mod assist       General transfer comment: Moderate posterior lean causes LOB and increased need for A for balance. Able to correct some with VC.  Ambulation/Gait Ambulation/Gait assistance: Mod assist Ambulation Distance (Feet): 5 Feet (short distances) Assistive device: Rolling walker (2 wheeled) Gait Pattern/deviations: Shuffle;Narrow base of support;Leaning posteriorly;Decreased stride length   Gait velocity interpretation: Below normal speed for  age/gender General Gait Details: Requires constant A to prevent LOB due to posterior lean.  Stairs            Wheelchair Mobility    Modified Rankin (Stroke Patients Only)       Balance Overall balance assessment: Needs assistance Sitting-balance support: Feet supported;Single extremity supported Sitting balance-Leahy Scale: Fair Sitting balance - Comments: Posterior lean Postural control: Posterior lean Standing balance support: Bilateral upper extremity supported Standing balance-Leahy Scale: Fair Standing balance comment: Posterior lean                             Pertinent Vitals/Pain Pain Assessment: No/denies pain    Home Living Family/patient expects to be discharged to:: Private residence Living Arrangements: Children Available Help at Discharge: Available 24 hours/day;Family (Daughter is with pt 24/hr per day and A pt for all mobility.) Type of Home: House Home Access: Stairs to enter Entrance Stairs-Rails: Can reach both Entrance Stairs-Number of Steps: 3 Home Layout: One level Home Equipment: Walker - 2 wheels;Cane - quad;Bedside commode;Wheelchair - manual;Hospital bed      Prior Function Level of Independence: Needs assistance (Daughter provides A for all mobility.)   Gait / Transfers Assistance Needed: Limited household ambulation with FWW and A from daughter.  ADL's / Homemaking Assistance Needed: family assistance        Hand Dominance        Extremity/Trunk Assessment   Upper Extremity Assessment: LUE deficits/detail       LUE Deficits / Details: decreased ROM from hx of fx   Lower Extremity Assessment: Generalized weakness (Grossly  B LE 3+/5)      Cervical / Trunk Assessment: Kyphotic  Communication   Communication: HOH  Cognition Arousal/Alertness: Awake/alert Behavior During Therapy: WFL for tasks assessed/performed Overall Cognitive Status: Within Functional Limits for tasks assessed                       General Comments General comments (skin integrity, edema, etc.): Fragile skin, multiple bruises and previous skin tears    Exercises Other Exercises Other Exercises: B LE therex: seated LAQ, marching, ankle pumps, hip abd and add with manual resistance x10 each. Mod VC for correct technique.      Assessment/Plan    PT Assessment Patient needs continued PT services  PT Diagnosis Difficulty walking;Generalized weakness   PT Problem List Decreased strength;Decreased activity tolerance;Decreased balance;Decreased mobility;Decreased safety awareness  PT Treatment Interventions Gait training;Stair training;Therapeutic activities;Therapeutic exercise;Balance training;Patient/family education   PT Goals (Current goals can be found in the Care Plan section) Acute Rehab PT Goals Patient Stated Goal: To go home. PT Goal Formulation: With patient Time For Goal Achievement: 01/16/16 Potential to Achieve Goals: Fair    Frequency Min 2X/week   Barriers to discharge Inaccessible home environment stairs    Co-evaluation               End of Session Equipment Utilized During Treatment: Gait belt Activity Tolerance: Patient tolerated treatment well Patient left: in chair;with call bell/phone within reach;with chair alarm set;with family/visitor present Nurse Communication: Mobility status (fragile skin)         Time: 1510-1545 PT Time Calculation (min) (ACUTE ONLY): 35 min   Charges:   PT Evaluation $PT Eval Moderate Complexity: 1 Procedure PT Treatments $Therapeutic Exercise: 8-22 mins   PT G Codes:        Adelene Idler, PT, DPT  01/02/2016, 4:25 PM [phone]

## 2016-01-02 NOTE — Discharge Instructions (Signed)
[facility] appointment on January 23, 2016 at 10:30am with Clarisa Kindred, FNP. Please call [phone] to reschedule.

## 2016-01-03 ENCOUNTER — Inpatient Hospital Stay (HOSPITAL_COMMUNITY)
Admit: 2016-01-03 | Discharge: 2016-01-03 | Disposition: A | Discharge status: Home / Self Care | Payer: Medicare PPO | Attending: Internal Medicine | Admitting: Internal Medicine

## 2016-01-03 DIAGNOSIS — I340 Nonrheumatic mitral (valve) insufficiency: Secondary | ICD-10-CM

## 2016-01-03 LAB — BASIC METABOLIC PANEL
Anion gap: 13 (ref 5–15)
BUN: 68 mg/dL — AB (ref 6–20)
CALCIUM: 8.5 mg/dL — AB (ref 8.9–10.3)
CHLORIDE: 102 mmol/L (ref 101–111)
CO2: 24 mmol/L (ref 22–32)
CREATININE: 2.39 mg/dL — AB (ref 0.44–1.00)
GFR calc Af Amer: 20 mL/min — ABNORMAL LOW (ref >60)
GFR calc non Af Amer: 17 mL/min — ABNORMAL LOW (ref >60)
GLUCOSE: 248 mg/dL — AB (ref 65–99)
Potassium: 4.8 mmol/L (ref 3.5–5.1)
Sodium: 139 mmol/L (ref 135–145)

## 2016-01-03 LAB — GLUCOSE, CAPILLARY
GLUCOSE-CAPILLARY: 229 mg/dL — AB (ref 65–99)
GLUCOSE-CAPILLARY: 258 mg/dL — AB (ref 65–99)
Glucose-Capillary: 310 mg/dL — ABNORMAL HIGH (ref 65–99)
Glucose-Capillary: 214 mg/dL — ABNORMAL HIGH (ref 65–99)
Glucose-Capillary: 150 mg/dL — ABNORMAL HIGH (ref 65–99)

## 2016-01-03 MED ORDER — IPRATROPIUM-ALBUTEROL 0.5-2.5 (3) MG/3ML IN SOLN
3.0000 mL | RESPIRATORY_TRACT | Status: DC
  Administered 2016-01-03 – 2016-01-04 (×5): 3 mL via RESPIRATORY_TRACT
  Filled 2016-01-03 (×6): qty 3

## 2016-01-03 NOTE — Progress Notes (Signed)
[facility] - [place] at [facility]   PATIENT NAME: Laura Jefferson    MR#:  [id number]  DATE OF BIRTH:  14-Nov-1925  SUBJECTIVE:  Patient admitted 01/01/16 for shortness of breath requiring BiPAP therapy emergency department, she is on 1 L nasal cannula Baseline she had evidence of congestive heart failure received IV diuresis. Clinically she has improved she is unable to provide any meaningful information given mental status/medical conditions, no complaints at this time Currently he was diaphoretic. Today in the morning, off oxygen, O2 sats were low in 70s on room air and oxygen was reapplied. Patient has been receiving nebulizers as needed, antibiotic, Zithromax, prednisone. She is not able to tell me if she is improving REVIEW OF SYSTEMS:  Unable to obtain given patient's mental status medical condition  DRUG ALLERGIES:  No Known Allergies  VITALS:  Blood pressure 124/50, pulse 80, temperature 98.1 F (36.7 C), temperature source Oral, resp. rate 16, height 5' (1.524 m), weight 48.308 kg (106 lb 8 oz), SpO2 100 %.  PHYSICAL EXAMINATION:   VITAL SIGNS: Filed Vitals:   01/03/16 0924 01/03/16 1148  BP: 116/52 124/50  Pulse: 85 80  Temp:  98.1 F (36.7 C)  Resp:  16   GENERAL:82 y.o.female moderate distress given mental status.  HEAD: Normocephalic, atraumatic.  EYES: Pupils equal, round, reactive to light. Unable to assess extraocular muscles given mental status/medical condition. No scleral icterus.  MOUTH: Dry mucosal membrane. Dentition intact. No abscess noted.  EAR, NOSE, THROAT: Clear without exudates. No external lesions.  NECK: Supple. No thyromegaly. No nodules. No JVD.  PULMONARY: Coarse rhonchi with scattered wheeze No use of accessory muscles, Good respiratory effort. good air entry bilaterally CHEST: Nontender to palpation.  CARDIOVASCULAR: S1 and S2. Regular rate and rhythm. No murmurs, rubs, or gallops. Trace edema. Pedal pulses 2+  bilaterally.  GASTROINTESTINAL: Soft, nontender, nondistended. No masses. Positive bowel sounds. No hepatosplenomegaly.  MUSCULOSKELETAL: No swelling, clubbing, or edema. Range of motion full in all extremities.  NEUROLOGIC: Unable to assess given mental status/medical condition SKIN: Multiple areas of ecchymosis all extremities No ulceration, lesions, rashes, or cyanosis. Skin warm and dry. Turgor intact.  PSYCHIATRIC: Unable to assess given mental status/medical condition        LABORATORY PANEL:   CBC  Recent Labs Lab 01/02/16 0527  WBC 10.7  HGB 8.6*  HCT 27.8*  PLT 209   ------------------------------------------------------------------------------------------------------------------  Chemistries   Recent Labs Lab 01/03/16 0735  NA 139  K 4.8  CL 102  CO2 24  GLUCOSE 248*  BUN 68*  CREATININE 2.39*  CALCIUM 8.5*   ------------------------------------------------------------------------------------------------------------------  Cardiac Enzymes  Recent Labs Lab 01/02/16 1553  TROPONINI 0.13*   ------------------------------------------------------------------------------------------------------------------  RADIOLOGY:  Dg Chest Port 1 View  01/01/2016  CLINICAL DATA:  Respiratory distress.  Shortness of breath. EXAM: PORTABLE CHEST 1 VIEW COMPARISON:  07/22/2015 FINDINGS: Stable cardiomegaly. Mild pulmonary edema, with decreased in the interim. Small pleural effusions suspected. No confluent airspace disease. No pneumothorax. Chronic changes about shoulders. IMPRESSION: Findings consistent with mild CHF. Electronically Signed   By: Rubye Oaks M.D.   On: 01/01/2016 23:24    EKG:   Orders placed or performed during the hospital encounter of 01/01/16  . EKG 12-Lead  . EKG 12-Lead    ASSESSMENT AND PLAN:  82 year old female DO NOT RESUSCITATE history of systolic congestive heart failure, COPD chronic respiratory failure 1 L nasal cannula who  presented with shortness of breath respiratory failure on2/2/17.  1.  Acute on chronic respiratory failure with hypoxia: Secondary to acute on chronic systolic congestive heart failure as well as likely COPD exacerbation: The patient has received IV diuresis with adequate urine output however her renal function , worsened so diuresis was placed on hold Cardiology input noted and appreciated Continuous submental oxygen, and DuoNeb treatments, oxygen, prednisone, Zithromax, following clinically  2. COPD exacerbation: Supplemental oxygen, DuoNeb treatments, continue steroids ,  azithromycin, continue home medications  3. Acute kidney injury on chronic kidney disease: Secondary to diuresis decreased diuresis follow urine output renal function. Kidney function is stable, has not improved  3. Type 2 diabetes with hyperglycemia, steroid-induced hyperglycemia: Decrease steroids as stated above diabetic note appreciated Continue Lantus follow Accu-Cheks with sliding scale coverage likely decrease with decrease steroids  4. Essential hypertension: Lopressor, blood pressure is well controlled 5. Anxiety not otherwise specified Valium 6. Venous thromboembolic prophylactic: Heparin subcutaneous 7. Generalized weakness, physical therapist recommends 24-hour supervision/assistance, we'll discussed with care management discharge planning         All the records are reviewed and case discussed with Care Management/Social Workerr. Management plans discussed with the patient, family and they are in agreement.  CODE STATUS: DO NOT RESUSCITATE  TOTAL TIME TAKING CARE OF THIS PATIENT: 35 minutes.   POSSIBLE D/C IN 2-3 DAYS, DEPENDING ON CLINICAL CONDITION.   Katharina Caper M.D on 01/03/2016 at 2:18 PM  Between 7am to 6pm - Pager - [phone]  After 6pm: House Pager: - [phone]  [facility]  Office  [phone]  CC: Primary care physician; Jerl Mina, MD

## 2016-01-03 NOTE — Progress Notes (Signed)
Patient was diaphoretic this morning. CBG=310. Patient stated , " I'm fine." 02 sat was 75% since patient had taken her oxygen off. 02 at 2 L was applied and on rechecked, it was in the upper 90s as indicated in the flow sheet. Dr. Tobi Bastos notified but no new order received.

## 2016-01-03 NOTE — Progress Notes (Signed)
*  PRELIMINARY RESULTS* Echocardiogram 2D Echocardiogram has been performed.  Laura Jefferson 01/03/2016, 2:45 PM

## 2016-01-04 DIAGNOSIS — I2109 ST elevation (STEMI) myocardial infarction involving other coronary artery of anterior wall: Secondary | ICD-10-CM

## 2016-01-04 DIAGNOSIS — I130 Hypertensive heart and chronic kidney disease with heart failure and stage 1 through stage 4 chronic kidney disease, or unspecified chronic kidney disease: Secondary | ICD-10-CM | POA: Diagnosis not present

## 2016-01-04 LAB — GLUCOSE, CAPILLARY
GLUCOSE-CAPILLARY: 183 mg/dL — AB (ref 65–99)
GLUCOSE-CAPILLARY: 276 mg/dL — AB (ref 65–99)
GLUCOSE-CAPILLARY: 198 mg/dL — AB (ref 65–99)
Glucose-Capillary: 199 mg/dL — ABNORMAL HIGH (ref 65–99)

## 2016-01-04 LAB — BASIC METABOLIC PANEL
Anion gap: 10 (ref 5–15)
BUN: 74 mg/dL — ABNORMAL HIGH (ref 6–20)
CALCIUM: 8.6 mg/dL — AB (ref 8.9–10.3)
CO2: 25 mmol/L (ref 22–32)
CREATININE: 2.23 mg/dL — AB (ref 0.44–1.00)
Chloride: 106 mmol/L (ref 101–111)
GFR calc non Af Amer: 19 mL/min — ABNORMAL LOW (ref >60)
GFR, EST AFRICAN AMERICAN: 21 mL/min — AB (ref >60)
Glucose, Bld: 213 mg/dL — ABNORMAL HIGH (ref 65–99)
Potassium: 4.8 mmol/L (ref 3.5–5.1)
Sodium: 141 mmol/L (ref 135–145)

## 2016-01-04 LAB — CBC
HCT: 25.5 % — ABNORMAL LOW (ref 35.0–47.0)
HEMOGLOBIN: 7.8 g/dL — AB (ref 12.0–16.0)
MCH: 24.7 pg — ABNORMAL LOW (ref 26.0–34.0)
MCHC: 30.7 g/dL — ABNORMAL LOW (ref 32.0–36.0)
MCV: 80.5 fL (ref 80.0–100.0)
PLATELETS: 224 10*3/uL (ref 150–440)
RBC: 3.17 MIL/uL — AB (ref 3.80–5.20)
RDW: 18.1 % — ABNORMAL HIGH (ref 11.5–14.5)
WBC: 13.3 10*3/uL — AB (ref 3.6–11.0)

## 2016-01-04 LAB — LIPID PANEL
CHOL/HDL RATIO: 3.5 ratio
Cholesterol: 115 mg/dL (ref 0–200)
HDL: 33 mg/dL — AB (ref >40)
LDL CALC: 47 mg/dL (ref 0–99)
Triglycerides: 176 mg/dL — ABNORMAL HIGH (ref <150)
VLDL: 35 mg/dL (ref 0–40)

## 2016-01-04 LAB — HEPARIN LEVEL (UNFRACTIONATED): Heparin Unfractionated: 0.67 IU/mL (ref 0.30–0.70)

## 2016-01-04 LAB — TROPONIN I
Troponin I: 42.45 ng/mL — ABNORMAL HIGH (ref <0.031)
Troponin I: 32.57 ng/mL — ABNORMAL HIGH (ref <0.031)
Troponin I: 28.59 ng/mL — ABNORMAL HIGH (ref <0.031)

## 2016-01-04 LAB — PROTIME-INR
INR: 1.02
PROTHROMBIN TIME: 13.6 s (ref 11.4–15.0)

## 2016-01-04 LAB — HEMOGLOBIN: Hemoglobin: 7.9 g/dL — ABNORMAL LOW (ref 12.0–16.0)

## 2016-01-04 LAB — APTT: aPTT: 36 seconds (ref 24–36)

## 2016-01-04 MED ORDER — CLOPIDOGREL BISULFATE 75 MG PO TABS
75.0000 mg | ORAL_TABLET | Freq: Every day | ORAL | Status: DC
  Administered 2016-01-05: 75 mg via ORAL
  Filled 2016-01-04: qty 1

## 2016-01-04 MED ORDER — IPRATROPIUM-ALBUTEROL 0.5-2.5 (3) MG/3ML IN SOLN
3.0000 mL | Freq: Four times a day (QID) | RESPIRATORY_TRACT | Status: DC
  Administered 2016-01-05 (×2): 3 mL via RESPIRATORY_TRACT
  Filled 2016-01-04 (×2): qty 3

## 2016-01-04 MED ORDER — DOCUSATE SODIUM 100 MG PO CAPS
100.0000 mg | ORAL_CAPSULE | Freq: Two times a day (BID) | ORAL | Status: DC
  Administered 2016-01-04 – 2016-01-12 (×11): 100 mg via ORAL
  Filled 2016-01-04 (×12): qty 1

## 2016-01-04 MED ORDER — ASPIRIN EC 325 MG PO TBEC
325.0000 mg | DELAYED_RELEASE_TABLET | Freq: Every day | ORAL | Status: DC
  Administered 2016-01-04: 325 mg via ORAL
  Filled 2016-01-04 (×2): qty 1

## 2016-01-04 MED ORDER — CLOPIDOGREL BISULFATE 75 MG PO TABS
600.0000 mg | ORAL_TABLET | Freq: Once | ORAL | Status: AC
  Administered 2016-01-04: 600 mg via ORAL
  Filled 2016-01-04: qty 8

## 2016-01-04 MED ORDER — IPRATROPIUM-ALBUTEROL 0.5-2.5 (3) MG/3ML IN SOLN
RESPIRATORY_TRACT | Status: AC
  Administered 2016-01-04: 3 mL
  Filled 2016-01-04: qty 3

## 2016-01-04 MED ORDER — GUAIFENESIN 100 MG/5ML PO SOLN
5.0000 mL | Freq: Four times a day (QID) | ORAL | Status: DC | PRN
  Administered 2016-01-04 – 2016-01-05 (×3): 100 mg via ORAL
  Filled 2016-01-04 (×3): qty 10

## 2016-01-04 MED ORDER — METOPROLOL TARTRATE 25 MG PO TABS
25.0000 mg | ORAL_TABLET | Freq: Four times a day (QID) | ORAL | Status: DC
  Administered 2016-01-04 – 2016-01-06 (×11): 25 mg via ORAL
  Filled 2016-01-04 (×12): qty 1

## 2016-01-04 MED ORDER — HEPARIN (PORCINE) IN NACL 100-0.45 UNIT/ML-% IJ SOLN
600.0000 [IU]/h | INTRAMUSCULAR | Status: DC
  Administered 2016-01-04: 600 [IU]/h via INTRAVENOUS
  Filled 2016-01-04 (×2): qty 250

## 2016-01-04 MED ORDER — NITROGLYCERIN 2 % TD OINT
0.5000 [in_us] | TOPICAL_OINTMENT | Freq: Three times a day (TID) | TRANSDERMAL | Status: DC
  Administered 2016-01-04: 0.5 [in_us] via TOPICAL
  Administered 2016-01-04: 1 [in_us] via TOPICAL
  Administered 2016-01-04 – 2016-01-05 (×3): 0.5 [in_us] via TOPICAL
  Administered 2016-01-05: 1 [in_us] via TOPICAL
  Administered 2016-01-06 (×2): 0.5 [in_us] via TOPICAL
  Filled 2016-01-04 (×8): qty 1

## 2016-01-04 NOTE — Plan of Care (Signed)
Problem: Phase II Progression Outcomes Goal: Anginal pain relieved Outcome: Not Applicable Date Met:  43/27/61 Patient had no complaints of chest pain

## 2016-01-04 NOTE — Progress Notes (Signed)
ANTICOAGULATION CONSULT NOTE - Initial Consult  Pharmacy Consult for Heparin Indication: chest pain/ACS  No Known Allergies  Patient Measurements: Height: 5' (152.4 cm) Weight: 107 lb 8 oz (48.762 kg) IBW/kg (Calculated) : 45.5 Heparin Dosing Weight: 48.8 kg  Vital Signs: Temp: 98.2 F (36.8 C) (02/05 0332) Temp Source: Oral (02/05 0332) BP: 121/56 mmHg (02/05 0332) Pulse Rate: 84 (02/05 0332)  Labs:  Recent Labs  01/01/16 2247 01/02/16 0527 01/02/16 1107 01/02/16 1553 01/03/16 0735 01/04/16 0618  HGB 9.0* 8.6*  --   --   --  7.9*  HCT 29.2* 27.8*  --   --   --   --   PLT 243 209  --   --   --   --   CREATININE 2.03* 2.41*  2.14*  --   --  2.39* 2.23*  TROPONINI 0.03 0.07* 0.11* 0.13*  --  42.45*    Estimated Creatinine Clearance: 12.5 mL/min (by C-G formula based on Cr of 2.23).    Assessment: 82 yo female with elevated troponin starting on heparin drip for ACS. Pt last received heparin 5000 units SQ at 0525.  Hgb 7.9 Plt count, INR, aPTT ordered   Goal of Therapy:  Heparin level 0.3-0.7 units/ml Monitor platelets by anticoagulation protocol: Yes   Plan:  D/c SQ heparin order No heparin bolus due to recent SQ dose Heparin 600 units/hr (=6 ml/hr) Anti-Xa in 8h - at 1730 CBC in AM  Pharmacy will continue to follow.   Marty Heck 01/04/2016,8:09 AM

## 2016-01-04 NOTE — Consult Note (Signed)
SUBJECTIVE: Patient with no current complaints. She has no chest pain, and her shortness of breath according to her is improved. That being said she does have dementia. She had a troponin that was checked this morning and was found to be 42. An EKG was done which showed significant ST changes including ST elevations in lead aVR. The patient is resting comfortably in her chair and is not having any complaints. She has not complained of chest pain throughout the hospital stay.  Marland Kitchen antiseptic oral rinse  7 mL Mouth Rinse BID  . aspirin EC  325 mg Oral QHS  . clopidogrel  600 mg Oral Once  . [START ON 01/05/2016] clopidogrel  75 mg Oral Daily  . Influenza vac split quadrivalent PF  0.5 mL Intramuscular Tomorrow-1000  . insulin aspart  0-5 Units Subcutaneous QHS  . insulin aspart  0-9 Units Subcutaneous TID WC  . insulin glargine  10 Units Subcutaneous Daily  . ipratropium-albuterol  3 mL Nebulization Q4H  . metoprolol tartrate  25 mg Oral Q6H  . nitroGLYCERIN  0.5 inch Topical 3 times per day  . pantoprazole  40 mg Oral Daily  . PARoxetine  20 mg Oral Daily  . predniSONE  40 mg Oral Q breakfast  . simvastatin  20 mg Oral QHS  . sodium chloride flush  3 mL Intravenous Q12H  . tiotropium  1 capsule Inhalation Daily   . heparin 600 Units/hr (01/04/16 0959)    OBJECTIVE: Physical Exam: Filed Vitals:   01/04/16 0015 01/04/16 0332 01/04/16 0811 01/04/16 0824  BP:  121/56 148/58   Pulse:  84 85   Temp:  98.2 F (36.8 C)    TempSrc:  Oral    Resp:  22    Height:      Weight:  107 lb 8 oz (48.762 kg)    SpO2: 100% 93% 97% 93%    Intake/Output Summary (Last 24 hours) at 01/04/16 1055 Last data filed at 01/04/16 1035  Gross per 24 hour  Intake    600 ml  Output    300 ml  Net    300 ml    Telemetry reveals sinus rhythm  GEN- The patient is well appearing, alert and oriented x 3 today.   Head- normocephalic, atraumatic Eyes-  Sclera clear, conjunctiva pink Ears- hearing  intact Oropharynx- clear Neck- supple, no JVP Lymph- no cervical lymphadenopathy Lungs- Clear to ausculation bilaterally, normal work of breathing Heart- Regular rate and rhythm, no murmurs, rubs or gallops, PMI not laterally displaced GI- soft, NT, ND, + BS Extremities- no clubbing, cyanosis, or edema Skin- no rash or lesion Psych- euthymic mood, full affect Neuro- strength and sensation are intact  LABS: Basic Metabolic Panel:  Recent Labs  82/95/62 0735 01/04/16 0618  NA 139 141  K 4.8 4.8  CL 102 106  CO2 24 25  GLUCOSE 248* 213*  BUN 68* 74*  CREATININE 2.39* 2.23*  CALCIUM 8.5* 8.6*   Liver Function Tests: No results for input(s): AST, ALT, ALKPHOS, BILITOT, PROT, ALBUMIN in the last 72 hours. No results for input(s): LIPASE, AMYLASE in the last 72 hours. CBC:  Recent Labs  01/01/16 2247 01/02/16 0527 01/04/16 0618 01/04/16 0819  WBC 19.2* 10.7  --  13.3*  NEUTROABS 14.2*  --   --   --   HGB 9.0* 8.6* 7.9* 7.8*  HCT 29.2* 27.8*  --  25.5*  MCV 82.1 82.7  --  80.5  PLT 243 209  --  224   Cardiac Enzymes:  Recent Labs  01/02/16 1107 01/02/16 1553 01/04/16 0618  TROPONINI 0.11* 0.13* 42.45*   BNP: Invalid input(s): POCBNP D-Dimer: No results for input(s): DDIMER in the last 72 hours. Hemoglobin A1C:  Recent Labs  01/02/16 0527  HGBA1C 6.6*   Fasting Lipid Panel:  Recent Labs  01/04/16 0802  CHOL 115  HDL 33*  LDLCALC 47  TRIG 797*  CHOLHDL 3.5   Thyroid Function Tests: No results for input(s): TSH, T4TOTAL, T3FREE, THYROIDAB in the last 72 hours.  Invalid input(s): FREET3 Anemia Panel: No results for input(s): VITAMINB12, FOLATE, FERRITIN, TIBC, IRON, RETICCTPCT in the last 72 hours.  RADIOLOGY: Dg Chest Port 1 View  01/01/2016  CLINICAL DATA:  Respiratory distress.  Shortness of breath. EXAM: PORTABLE CHEST 1 VIEW COMPARISON:  07/22/2015 FINDINGS: Stable cardiomegaly. Mild pulmonary edema, with decreased in the interim. Small  pleural effusions suspected. No confluent airspace disease. No pneumothorax. Chronic changes about shoulders. IMPRESSION: Findings consistent with mild CHF. Electronically Signed   By: Rubye Oaks M.D.   On: 01/01/2016 23:24    ASSESSMENT AND PLAN:  Principal Problem:   Acute on chronic respiratory failure with hypoxia (HCC) Active Problems:   Acute on chronic combined systolic and diastolic CHF (congestive heart failure) (HCC)   Paroxysmal a-fib   COPD exacerbation (HCC)   Type 2 diabetes mellitus (HCC)   Anxiety   Acute respiratory failure with hypoxia (HCC) STEMI: Patient appears to be having STEMI at this time. It is unclear as to this started, as the patient did not have any chest pain. For her STEMI, we have started her on heparin drip, aspirin, and have given her 600 mg of Plavix. She should take 75 mg of Plavix daily after today. We Early Ord get an echocardiogram tomorrow to determine her LV function. Depending on her LV function she may require heart catheterization. I have discussed this with her daughter. Her daughter told me that she is DO NOT RESUSCITATE status, and that she agrees with medical management at this time and waiting for the echocardiogram tomorrow.  Akshaj Besancon Jorja Loa, MD 01/04/2016 10:55 AM

## 2016-01-04 NOTE — Progress Notes (Signed)
Patient daughter concerned about patient having a BM. Last BM documented on 01/01/16. Per Dr. Winona Legato okay to order colace 100mg  BID. Will continue to monitor patient.

## 2016-01-04 NOTE — Progress Notes (Signed)
Dr. Denzil Magnuson made aware of Troponin 42.45. Per MD get stat EKG. Will continue to monitor patient. Patient has no complaints of chest pain or SOB.

## 2016-01-04 NOTE — Progress Notes (Signed)
ANTICOAGULATION CONSULT NOTE - Initial Consult  Pharmacy Consult for Heparin Indication: chest pain/ACS  No Known Allergies  Patient Measurements: Height: 5' (152.4 cm) Weight: 107 lb 8 oz (48.762 kg) IBW/kg (Calculated) : 45.5 Heparin Dosing Weight: 48.8 kg  Vital Signs: Temp: 98.6 F (37 C) (02/05 1104) Temp Source: Oral (02/05 1104) BP: 114/61 mmHg (02/05 1340) Pulse Rate: 73 (02/05 1340)  Labs:  Recent Labs  01/01/16 2247 01/02/16 0527  01/02/16 1553 01/03/16 0735 01/04/16 0618 01/04/16 0819 01/04/16 1152 01/04/16 1757  HGB 9.0* 8.6*  --   --   --  7.9* 7.8*  --   --   HCT 29.2* 27.8*  --   --   --   --  25.5*  --   --   PLT 243 209  --   --   --   --  224  --   --   APTT  --   --   --   --   --   --  36  --   --   LABPROT  --   --   --   --   --   --  13.6  --   --   INR  --   --   --   --   --   --  1.02  --   --   HEPARINUNFRC  --   --   --   --   --   --   --   --  0.67  CREATININE 2.03* 2.41*  2.14*  --   --  2.39* 2.23*  --   --   --   TROPONINI 0.03 0.07*  < > 0.13*  --  42.45*  --  32.57*  --   < > = values in this interval not displayed.  Estimated Creatinine Clearance: 12.5 mL/min (by C-G formula based on Cr of 2.23).   Assessment: 82 yo female with elevated troponin starting on heparin drip for ACS. Pt last received heparin 5000 units SQ at 0525 on  Baseline labs are as follows: Hgb 7.9 Plt 224  INR 1.02 APTT 36  Goal of Therapy:  Heparin level 0.3-0.7 units/ml Monitor platelets by anticoagulation protocol: Yes   Plan:  2/5 8 hour heparin level @ 1800 of 0.67 units/mL is therapeutic on infusion rate of 600 units/hr. Ordered CBC with AM labs tomorrow and confirmatory heparin level in 8 hours at 0200 on 2/6.  Pharmacy will continue to follow. Thank you for the consult.   Cindi Carbon, PharmD Clinical Pharmacist 01/04/2016,6:37 PM

## 2016-01-04 NOTE — Progress Notes (Signed)
[facility] - [place] at [facility]   PATIENT NAME: Laura Jefferson    MR#:  [id number]  DATE OF BIRTH:  Jan 11, 1925  SUBJECTIVE:  Patient admitted 01/01/16 for shortness of breath requiring BiPAP therapy emergency department, she is on 1 L nasal cannula Baseline she had evidence of congestive heart failure received IV diuresis. Clinically she has improved she is unable to provide any meaningful information given mental status/medical conditions, no complaints at this time Yesterday she was taken off oxygen per nasal cannulas and was diaphoretic off oxygen, O2 sats were low in 70s on room air and oxygen was reapplied. Patient has been receiving nebulizers as needed, antibiotic, Zithromax, prednisone. She is not able to tell me if she is improving Labs today revealed a troponin level elevated at 43. Patient denies any chest pains or discomfort. EKG today revealed multiple ST-T abnormalities. Cardiology consultation was obtained, echocardiogram is ordered, cardiac catheterization depending on echocardiogram results REVIEW OF SYSTEMS:  Unable to obtain given patient's mental status medical condition  DRUG ALLERGIES:  No Known Allergies  VITALS:  Blood pressure 114/61, pulse 73, temperature 98.6 F (37 C), temperature source Oral, resp. rate 16, height 5' (1.524 m), weight 48.762 kg (107 lb 8 oz), SpO2 94 %.  PHYSICAL EXAMINATION:   VITAL SIGNS: Filed Vitals:   01/04/16 1104 01/04/16 1340  BP: 125/41 114/61  Pulse: 82 73  Temp: 98.6 F (37 C)   Resp: 16    GENERAL:82 y.o.female moderate distress given mental status.  HEAD: Normocephalic, atraumatic.  EYES: Pupils equal, round, reactive to light. Unable to assess extraocular muscles given mental status/medical condition. No scleral icterus.  MOUTH: Dry mucosal membrane. Dentition intact. No abscess noted.  EAR, NOSE, THROAT: Clear without exudates. No external lesions.  NECK: Supple. No thyromegaly. No nodules. No  JVD.  PULMONARY: Coarse rhonchi with scattered wheeze No use of accessory muscles, Good respiratory effort. good air entry bilaterally CHEST: Nontender to palpation.  CARDIOVASCULAR: S1 and S2. Regular rate and rhythm. No murmurs, rubs, or gallops. Trace edema. Pedal pulses 2+ bilaterally.  GASTROINTESTINAL: Soft, nontender, nondistended. No masses. Positive bowel sounds. No hepatosplenomegaly.  MUSCULOSKELETAL: No swelling, clubbing, or edema. Range of motion full in all extremities.  NEUROLOGIC: Unable to assess given mental status/medical condition SKIN: Multiple areas of ecchymosis all extremities No ulceration, lesions, rashes, or cyanosis. Skin warm and dry. Turgor intact.  PSYCHIATRIC: Unable to assess given mental status/medical condition        LABORATORY PANEL:   CBC  Recent Labs Lab 01/04/16 0819  WBC 13.3*  HGB 7.8*  HCT 25.5*  PLT 224   ------------------------------------------------------------------------------------------------------------------  Chemistries   Recent Labs Lab 01/04/16 0618  NA 141  K 4.8  CL 106  CO2 25  GLUCOSE 213*  BUN 74*  CREATININE 2.23*  CALCIUM 8.6*   ------------------------------------------------------------------------------------------------------------------  Cardiac Enzymes  Recent Labs Lab 01/04/16 1152  TROPONINI 32.57*   ------------------------------------------------------------------------------------------------------------------  RADIOLOGY:  No results found.  EKG:   Orders placed or performed during the hospital encounter of 01/01/16  . EKG 12-Lead  . EKG 12-Lead  . EKG 12-Lead  . EKG 12-Lead    ASSESSMENT AND PLAN:  82 year old female DO NOT RESUSCITATE history of systolic congestive heart failure, COPD chronic respiratory failure 1 L nasal cannula who presented with shortness of breath respiratory failure on2/2/17.  1. Acute on chronic respiratory failure with hypoxia: Secondary to acute  on chronic systolic congestive heart failure as well as likely COPD exacerbation:  The patient has received IV diuresis with adequate urine output however her renal function , worsened so diuresis was placed on hold. Continuous supplemental oxygen, and DuoNeb treatments, oxygen, prednisone, Zithromax, some improved and stable clinically  2. COPD exacerbation: Supplemental oxygen, DuoNeb treatments, continue steroids ,  azithromycin, continue home medications, less wheezing today  3. Acute kidney injury on chronic kidney disease: Secondary to diuresis decreased diuresis follow urine output renal function. Kidney function is stable, minimal improvement  3. Type 2 diabetes with hyperglycemia, steroid-induced hyperglycemia: Decreased steroids  diabetic note appreciated, patient is initiated on insulin Lantus at 10 units daily at bedtime, advance Continue sliding scale coverage   4. Essential hypertension: Lopressor, blood pressure is well controlled 5. Anxiety not otherwise specified Valium 6. Venous thromboembolic prophylactic: Heparin subcutaneous 7. Generalized weakness, physical therapist recommends 24-hour supervision/assistance, we'll discussed with care management discharge planning 8. Non-Q-wave MI, appreciate cardiology's input, Plavix load is initiated. Continue patient on metoprolol , advanced dose, as well as aspirin , Heparin intravenously. Nitroglycerin topically is also ordered. Echocardiogram will be performed and decision making depending on cardiology's recommendations. Cardiologist discussed care with patient's family, medical management for now         All the records are reviewed and case discussed with Care Management/Social Workerr. Management plans discussed with the patient, family and they are in agreement.  CODE STATUS: DO NOT RESUSCITATE  TOTAL TIME TAKING CARE OF THIS PATIENT:  45 minutes,  discussed with the cardiologist for about 10-15 minutes.   POSSIBLE D/C IN  2-3 DAYS, DEPENDING ON CLINICAL CONDITION.   Katharina Caper M.D on 01/04/2016 at 3:47 PM  Between 7am to 6pm - Pager - [phone]  After 6pm: House Pager: - [phone]  [facility]  Office  [phone]  CC: Primary care physician; Jerl Mina, MD

## 2016-01-04 NOTE — Progress Notes (Signed)
Daughter concerns that the patient is having respiratory distress and that she needs X- Ray, ABT and cough syrup for cough. Respiratory assess the patient as well as the RN. Patient is not in any distress as is stated by her daughter. DR. Judithann Sheen notified of daughter's concern. No new order received but Robitussin 2 teaspoonful every 6 hours PRN for cough. Will continue to monitor.

## 2016-01-04 NOTE — Plan of Care (Signed)
Problem: Activity: Goal: Risk for activity intolerance will decrease Outcome: Progressing Patient up in chair this am.      

## 2016-01-05 DIAGNOSIS — I214 Non-ST elevation (NSTEMI) myocardial infarction: Secondary | ICD-10-CM

## 2016-01-05 LAB — CBC
HEMATOCRIT: 23.5 % — AB (ref 35.0–47.0)
HEMATOCRIT: 23.9 % — AB (ref 35.0–47.0)
HEMOGLOBIN: 7.6 g/dL — AB (ref 12.0–16.0)
Hemoglobin: 7.4 g/dL — ABNORMAL LOW (ref 12.0–16.0)
MCH: 25 pg — ABNORMAL LOW (ref 26.0–34.0)
MCH: 25.2 pg — ABNORMAL LOW (ref 26.0–34.0)
MCHC: 31.6 g/dL — AB (ref 32.0–36.0)
MCHC: 31.6 g/dL — ABNORMAL LOW (ref 32.0–36.0)
MCV: 79 fL — ABNORMAL LOW (ref 80.0–100.0)
MCV: 79.6 fL — ABNORMAL LOW (ref 80.0–100.0)
PLATELETS: 211 10*3/uL (ref 150–440)
Platelets: 217 10*3/uL (ref 150–440)
RBC: 2.97 MIL/uL — ABNORMAL LOW (ref 3.80–5.20)
RBC: 3.01 MIL/uL — AB (ref 3.80–5.20)
RDW: 17.6 % — AB (ref 11.5–14.5)
RDW: 17.8 % — ABNORMAL HIGH (ref 11.5–14.5)
WBC: 12 10*3/uL — AB (ref 3.6–11.0)
WBC: 13.8 10*3/uL — AB (ref 3.6–11.0)

## 2016-01-05 LAB — BASIC METABOLIC PANEL
ANION GAP: 10 (ref 5–15)
ANION GAP: 8 (ref 5–15)
BUN: 77 mg/dL — AB (ref 6–20)
BUN: 79 mg/dL — ABNORMAL HIGH (ref 6–20)
CALCIUM: 8.5 mg/dL — AB (ref 8.9–10.3)
CALCIUM: 8.6 mg/dL — AB (ref 8.9–10.3)
CO2: 24 mmol/L (ref 22–32)
CO2: 23 mmol/L (ref 22–32)
Chloride: 102 mmol/L (ref 101–111)
Chloride: 105 mmol/L (ref 101–111)
Creatinine, Ser: 2.43 mg/dL — ABNORMAL HIGH (ref 0.44–1.00)
Creatinine, Ser: 2.19 mg/dL — ABNORMAL HIGH (ref 0.44–1.00)
GFR calc Af Amer: 19 mL/min — ABNORMAL LOW (ref >60)
GFR, EST AFRICAN AMERICAN: 22 mL/min — AB (ref >60)
GFR, EST NON AFRICAN AMERICAN: 17 mL/min — AB (ref >60)
GFR, EST NON AFRICAN AMERICAN: 19 mL/min — AB (ref >60)
Glucose, Bld: 230 mg/dL — ABNORMAL HIGH (ref 65–99)
Glucose, Bld: 208 mg/dL — ABNORMAL HIGH (ref 65–99)
POTASSIUM: 4.5 mmol/L (ref 3.5–5.1)
POTASSIUM: 4.6 mmol/L (ref 3.5–5.1)
SODIUM: 136 mmol/L (ref 135–145)
Sodium: 136 mmol/L (ref 135–145)

## 2016-01-05 LAB — HEPARIN LEVEL (UNFRACTIONATED): HEPARIN UNFRACTIONATED: 0.63 [IU]/mL (ref 0.30–0.70)

## 2016-01-05 LAB — GLUCOSE, CAPILLARY
GLUCOSE-CAPILLARY: 201 mg/dL — AB (ref 65–99)
GLUCOSE-CAPILLARY: 201 mg/dL — AB (ref 65–99)
GLUCOSE-CAPILLARY: 241 mg/dL — AB (ref 65–99)
GLUCOSE-CAPILLARY: 260 mg/dL — AB (ref 65–99)

## 2016-01-05 LAB — TROPONIN I: TROPONIN I: 20.8 ng/mL — AB (ref <0.031)

## 2016-01-05 MED ORDER — HEPARIN SODIUM (PORCINE) 5000 UNIT/ML IJ SOLN
5000.0000 [IU] | Freq: Three times a day (TID) | INTRAMUSCULAR | Status: DC
  Administered 2016-01-05 – 2016-01-09 (×12): 5000 [IU] via SUBCUTANEOUS
  Filled 2016-01-05 (×12): qty 1

## 2016-01-05 MED ORDER — FUROSEMIDE 10 MG/ML IJ SOLN
40.0000 mg | Freq: Once | INTRAMUSCULAR | Status: AC
  Administered 2016-01-05: 40 mg via INTRAVENOUS
  Filled 2016-01-05: qty 4

## 2016-01-05 MED ORDER — ALBUTEROL SULFATE (2.5 MG/3ML) 0.083% IN NEBU
2.5000 mg | INHALATION_SOLUTION | RESPIRATORY_TRACT | Status: DC | PRN
  Administered 2016-01-06 – 2016-01-07 (×2): 2.5 mg via RESPIRATORY_TRACT
  Filled 2016-01-05 (×2): qty 3

## 2016-01-05 MED ORDER — INSULIN GLARGINE 100 UNIT/ML ~~LOC~~ SOLN
16.0000 [IU] | Freq: Every day | SUBCUTANEOUS | Status: DC
  Administered 2016-01-06: 16 [IU] via SUBCUTANEOUS
  Filled 2016-01-05 (×2): qty 0.16

## 2016-01-05 NOTE — Care Management (Signed)
Patient has qualified for continuous home 02.  Patient's daughter says that patient only uses her oxygen at night. Contacted [employer] to deteimine actual order for oxygen was for continuous.  Patient's daughter Briant Cedar says that unless patient's functional status has greatly declined, would anticipate return home with home health.  Patient had a sudden rise in troponin 2/5 to 42.45 and patient did not experience any chest pain.  Currently on heparin drip.  Discussed physical therapy consult pending.

## 2016-01-05 NOTE — Progress Notes (Signed)
Daughter at bedside majority of day. Questions regarding delay in PT answered (d/t elevated troponins and pending echo results, undecided cardiac intervention) to familys reported satisfaction. Nursing staff got patient up in chair for approx. 3 hours.

## 2016-01-05 NOTE — Progress Notes (Signed)
Patient: Laura Jefferson / Admit Date: 01/01/2016 / Date of Encounter: 01/05/2016, 11:17 AM   Subjective: No acute events overnight. Troponin trending down from 42.45-->32.57-->28.59-->20.80. Echo is pending this morning. Hgb is down trending from 9.0 upon admission to 7.6 this morning. Spoke with her nurse and there has been no known BRBPR or melena at this time.   Review of Systems: Review of Systems  Unable to perform ROS: dementia    Objective: Telemetry: sinus, 70's Physical Exam: Blood pressure 118/56, pulse 76, temperature 97.7 F (36.5 C), temperature source Oral, resp. rate 18, height 5' (1.524 m), weight 107 lb 1.6 oz (48.58 kg), SpO2 93 %. Body mass index is 20.92 kg/(m^2). General: Frail appearing, in no acute distress. Head: Normocephalic, atraumatic, sclera non-icteric, no xanthomas, nares are without discharge. Neck: Negative for carotid bruits. JVP not elevated. Lungs: Decreased and coarse breath sounds bilaterally. Breathing is unlabored on nasal cannula. Heart: RRR S1 S2 without murmurs, rubs, or gallops.  Abdomen: Soft, non-tender, non-distended with normoactive bowel sounds. No rebound/guarding. Extremities: No clubbing or cyanosis. No edema. Distal pedal pulses are 2+ and equal bilaterally. Neuro: Somnolent. Psych:  Opens eyes to questions.   Intake/Output Summary (Last 24 hours) at 01/05/16 1117 Last data filed at 01/05/16 1003  Gross per 24 hour  Intake 1446.1 ml  Output   1900 ml  Net -453.9 ml    Inpatient Medications:  . antiseptic oral rinse  7 mL Mouth Rinse BID  . clopidogrel  75 mg Oral Daily  . docusate sodium  100 mg Oral BID  . insulin aspart  0-5 Units Subcutaneous QHS  . insulin aspart  0-9 Units Subcutaneous TID WC  . insulin glargine  10 Units Subcutaneous Daily  . ipratropium-albuterol  3 mL Nebulization QID  . metoprolol tartrate  25 mg Oral Q6H  . nitroGLYCERIN  0.5 inch Topical 3 times per day  . pantoprazole  40 mg Oral Daily  .  PARoxetine  20 mg Oral Daily  . predniSONE  40 mg Oral Q breakfast  . simvastatin  20 mg Oral QHS  . sodium chloride flush  3 mL Intravenous Q12H  . tiotropium  1 capsule Inhalation Daily   Infusions:  . heparin 600 Units/hr (01/04/16 0959)    Labs:  Recent Labs  01/05/16 0300 01/05/16 0756  NA 136 136  K 4.5 4.6  CL 102 105  CO2 24 23  GLUCOSE 230* 208*  BUN 77* 79*  CREATININE 2.43* 2.19*  CALCIUM 8.5* 8.6*   No results for input(s): AST, ALT, ALKPHOS, BILITOT, PROT, ALBUMIN in the last 72 hours.  Recent Labs  01/05/16 0300 01/05/16 0756  WBC 12.0* 13.8*  HGB 7.4* 7.6*  HCT 23.5* 23.9*  MCV 79.0* 79.6*  PLT 211 217    Recent Labs  01/04/16 0618 01/04/16 1152 01/04/16 1757 01/05/16 0756  TROPONINI 42.45* 32.57* 28.59* 20.80*   Invalid input(s): POCBNP No results for input(s): HGBA1C in the last 72 hours.   Weights: Filed Weights   01/03/16 0432 01/04/16 0332 01/05/16 0630  Weight: 106 lb 8 oz (48.308 kg) 107 lb 8 oz (48.762 kg) 107 lb 1.6 oz (48.58 kg)     Radiology/Studies:  Dg Chest Port 1 View  01/01/2016  CLINICAL DATA:  Respiratory distress.  Shortness of breath. EXAM: PORTABLE CHEST 1 VIEW COMPARISON:  07/22/2015 FINDINGS: Stable cardiomegaly. Mild pulmonary edema, with decreased in the interim. Small pleural effusions suspected. No confluent airspace disease. No pneumothorax. Chronic changes  about shoulders. IMPRESSION: Findings consistent with mild CHF. Electronically Signed   By: Rubye Oaks M.D.   On: 01/01/2016 23:24     Assessment and Plan   1. Acute ST elevation MI: -Medically managed at this time per the families wishes with IV heparin gtt, aspirin and Plavix -Given the patient did not have any chest discomfort, current down trending troponin, and underlying comorbidities this treatment course is still felt to be appropriate  -She does have acute on chronic anemia with hgb trending down from 9-->7.6 this morning -Vitals are  completely stable with SBP of 127 and pulse of 80 -She received Plavix 75 mg already this morning -Will need to hold further aspirin, Plavix and heparin given this drop in hgb -Echo remains pending to evaluate LV systolic function and wall motion  2. Acute on chronic anemia: -As above, hold aspirin, Plavix, and heparin gtt  -Recommend transfusion given current hgb of 7.6, per Up To Date   3. Acute on chronic combined CHF: -EF 40-45% by echo in 02/2015. A repeat echocardiogram to assess LV function is pending. -BNP 316 on admission (had been elevated to 4000 three months ago) and CXR showing mild CHF -Continue to hold Lasix given renal function  4. Acute on CKD stage IV: -Likely 2/2 anemia -Per IM  5. PAF: -Reported a history of PAF but it has never been documented. Maintaining NSR at this time. -Would not start anticoagulation until she has a recorded episode of PAF due to her frail appearing state and worsening dementia. No family is available at the time of this encounter to assess fall risk. It appears she was in a wheelchair at the time of her last visit with Dr. Mariah Milling. -Continue Lopressor  BID.  Poor prognosis   Signed, Eula Listen, PA-C Pager: [phone] 01/05/2016, 11:17 AM

## 2016-01-05 NOTE — Progress Notes (Signed)
[facility] [place] at [facility]   PATIENT NAME: Laura Jefferson    MR#:  [id number]  DATE OF BIRTH:  [birth year]  SUBJECTIVE:  Patient admitted 01/01/16 for shortness of breath requiring BiPAP therapy emergency department, she is on 1 L nasal cannulas at baseline,  she had evidence of congestive heart failure received IV diuresis. Clinically she has improved she is unable to provide any meaningful information given mental status/medical conditions, no complaints at this time 2 days ago she was taken off oxygen per nasal cannulas and was diaphoretic off oxygen, O2 sats were low in 70s on room air and oxygen was reapplied. Patient has been receiving nebulizers as needed, antibiotic Zithromax, prednisone. She is not able to tell me if she is improving Labs yesterday in the morning revealed a troponin level elevated at 43. Patient denied any chest pains or discomfort. EKG revealed multiple ST-T abnormalities. Cardiology consultation was obtained, echocardiogram was ordered, pending, cardiac catheterization depending on echocardiogram results. Physical therapist was not able to work with patient due to cardiac workup REVI EW OF SYSTEMS:  Unable to obtain given patient's mental status medical condition  DRUG ALLERGIES:  No Known Allergies  VITALS:  Blood pressure 127/72, pulse 80, temperature 98.1 F (36.7 C), temperature source Oral, resp. rate 21, height 5' (1.524 m), weight 48.58 kg (107 lb 1.6 oz), SpO2 96 %.  PHYSICAL EXAMINATION:   VITAL SIGNS: Filed Vitals:   01/05/16 0806 01/05/16 1124  BP: 118/56 127/72  Pulse: 76 80  Temp:  98.1 F (36.7 C)  Resp:  21   GENERAL:82 y.o.female moderate distress given mental status.  HEAD: Normocephalic, atraumatic.  EYES: Pupils equal, round, reactive to light. Unable to assess extraocular muscles given mental status/medical condition. No scleral icterus.  MOUTH: Dry mucosal membrane. Dentition intact. No abscess noted.   EAR, NOSE, THROAT: Clear without exudates. No external lesions.  NECK: Supple. No thyromegaly. No nodules. No JVD.  PULMONARY: Coarse rhonchi with scattered wheeze No use of accessory muscles , good respiratory effort, satisfactory air entry bilaterally CHEST: Nontender to palpation.  CARDIOVASCULAR: S1 and S2. Regular rate and rhythm. No murmurs, rubs, or gallops. Trace edema. Pedal pulses 2+ bilaterally.  GASTROINTESTINAL: Soft, nontender, nondistended. No masses. Positive bowel sounds. No hepatosplenomegaly.  MUSCULOSKELETAL: No swelling, clubbing, or edema. Range of motion full in all extremities.  NEUROLOGIC: Unable to assess given mental status/medical condition SKIN: Multiple areas of ecchymosis all extremities No ulceration, lesions, rashes, or cyanosis. Skin warm and dry. Turgor intact.  PSYCHIATRIC: Unable to assess given mental status/medical condition        LABORATORY PANEL:   CBC  Recent Labs Lab 01/05/16 0756  WBC 13.8*  HGB 7.6*  HCT 23.9*  PLT 217   ------------------------------------------------------------------------------------------------------------------  Chemistries   Recent Labs Lab 01/05/16 0756  NA 136  K 4.6  CL 105  CO2 23  GLUCOSE 208*  BUN 79*  CREATININE 2.19*  CALCIUM 8.6*   ------------------------------------------------------------------------------------------------------------------  Cardiac Enzymes  Recent Labs Lab 01/05/16 0756  TROPONINI 20.80*   ------------------------------------------------------------------------------------------------------------------  RADIOLOGY:  No results found.  EKG:   Orders placed or performed during the hospital encounter of 01/01/16  . EKG 12-Lead  . EKG 12-Lead  . EKG 12-Lead  . EKG 12-Lead    ASSESSMENT AND PLAN:  82 year old female DO NOT RESUSCITATE history of systolic congestive heart failure, COPD chronic respiratory failure 1 L nasal cannula who presented with  shortness of breath respiratory failure on2/2/17.  1.  Acute on chronic respiratory failure with hypoxia: Secondary to acute on chronic systolic congestive heart failure as well as COPD exacerbation: The patient has received IV diuresis with adequate urine output,  however her renal function  Worsened,  so diuresis was placed on hold. Continuous supplemental oxygen, and DuoNeb treatments,  prednisone, Zithromax, improved and stable clinically, repeating chest x-ray   2. COPD exacerbation: Supplemental oxygen, DuoNeb treatments, continue steroids ,  azithromycin, continue home medications, improved wheezing   3. Acute kidney injury on chronic kidney disease: Secondary to diuresis decreased diuresis follow urine output renal function. Kidney function is some better ,  minimal improvement, creatinine was 1.9-2.0 in August 2016   3. Type 2 diabetes with hyperglycemia, steroid-induced hyperglycemia: Decreased steroids  diabetic note appreciated, patient is initiated on insulin Lantus at 10 units daily at bedtime, advance to 16 tonight  Continue sliding scale coverage   4. Essential hypertension: Lopressor, blood pressure is well controlled 5. Anxiety not otherwise specified Valium 6. Venous thromboembolic prophylactic: Heparin subcutaneous 7. Generalized weakness, physical therapist recommends 24-hour supervision/assistance, we'll discussed with care management discharge planning 8. Non-Q-wave MI, appreciate cardiology's input, Plavix load is initiated. Continue patient on metoprolol , now on advanced dose,. Continue aspirin , Heparin subcutaneously. Nitroglycerin topically . Echo results are pending,decision making depending on cardiology's recommendations. Cardiologist discussed care with patient's family, medical management for now         All the records are reviewed and case discussed with Care Management/Social Workerr. Management plans discussed with the patient, family and they are in  agreement.  CODE STATUS: DO NOT RESUSCITATE  TOTAL TIME TAKING CARE OF THIS PATIEnt    POSSIBLE D/C IN 2-3 DAYS, DEPENDING ON CLINICAL CONDITION.   Katharina Caper M.D on 01/05/2016 at 2:41 PM  Between 7am to 6pm - Pager - [phone]  After 6pm: House Pager: - [phone]  [facility]  Office  [phone]  CC: Primary care physician; Jerl Mina, MD

## 2016-01-05 NOTE — Progress Notes (Signed)
SATURATION QUALIFICATIONS: (This note is used to comply with regulatory documentation for home oxygen)  Patient Saturations on Room Air at Rest = 83%  Patient Saturations on Room Air while Ambulating = N/A%  Patient Saturations on N/A Liters of oxygen while Ambulating = N/A%  Please briefly explain why patient needs home oxygen:

## 2016-01-05 NOTE — Progress Notes (Signed)
PT Cancellation Note  Patient Details Name: Laura Jefferson MRN: [id number] DOB: 1925/08/03   Cancelled Treatment:    Reason Eval/Treat Not Completed: Medical issues which prohibited therapy (Pt tropinin is high with possible NSTEMI on 2/5. Awaiting re)ults of echocardiogram for possible cardiac catherization. Will f/u and resume PT when appropriate.   Adelene Idler, PT, DPT  01/05/2016, 2:09 PM [phone]

## 2016-01-05 NOTE — Progress Notes (Signed)
Inpatient Diabetes Program Recommendations  AACE/ADA: New Consensus Statement on Inpatient Glycemic Control (2015)  Target Ranges:  Prepandial:   less than 140 mg/dL      Peak postprandial:   less than 180 mg/dL (1-2 hours)      Critically ill patients:  140 - 180 mg/dL   Review of Glycemic ControlResults for RISSA, FRALEIGH (MRN [id number]) as of 01/05/2016 09:52  Ref. Range 01/04/2016 07:21 01/04/2016 11:01 01/04/2016 16:22 01/04/2016 21:41 01/05/2016 07:26  Glucose-Capillary Latest Ref Range: 65-99 mg/dL 425 (H) 956 (H) 387 (H) 198 (H) 201 (H)   Inpatient Diabetes Program Recommendations:   Consider increasing Novolog correction to moderate while patient is on PO Prednisone.    Thanks, Beryl Meager, RN, BC-ADM Inpatient Diabetes Coordinator Pager [phone] (8a-5p)

## 2016-01-05 NOTE — Plan of Care (Signed)
Problem: Pain Managment: Goal: General experience of comfort will improve Outcome: Progressing Patient has not complained of any pain this shift.  Problem: Activity: Goal: Risk for activity intolerance will decrease Outcome: Progressing Patient up in chair for approx. 3 hours today, per encouragement of staff and daughter request.

## 2016-01-05 NOTE — Care Management Important Message (Signed)
Important Message  Patient Details  Name: Laura Jefferson MRN: [id number] Date of Birth: 1925/11/24   Medicare Important Message Given:  Yes    Olegario Messier A Kayleen Alig 01/05/2016, 3:29 PM

## 2016-01-05 NOTE — Progress Notes (Signed)
ANTICOAGULATION CONSULT NOTE - Initial Consult  Pharmacy Consult for Heparin Indication: chest pain/ACS  No Known Allergies  Patient Measurements: Height: 5' (152.4 cm) Weight: 107 lb 8 oz (48.762 kg) IBW/kg (Calculated) : 45.5 Heparin Dosing Weight: 48.8 kg  Vital Signs: Temp: 97.7 F (36.5 C) (02/06 0414) Temp Source: Oral (02/06 0414) BP: 133/67 mmHg (02/06 0414) Pulse Rate: 73 (02/06 0414)  Labs:  Recent Labs  01/02/16 0527  01/03/16 0735 01/04/16 0618 01/04/16 0819 01/04/16 1152 01/04/16 1757 01/05/16 0300  HGB 8.6*  --   --  7.9* 7.8*  --   --  7.4*  HCT 27.8*  --   --   --  25.5*  --   --  23.5*  PLT 209  --   --   --  224  --   --  211  APTT  --   --   --   --  36  --   --   --   LABPROT  --   --   --   --  13.6  --   --   --   INR  --   --   --   --  1.02  --   --   --   HEPARINUNFRC  --   --   --   --   --   --  0.67 0.63  CREATININE 2.41*  2.14*  --  2.39* 2.23*  --   --   --  2.43*  TROPONINI 0.07*  < >  --  42.45*  --  32.57* 28.59*  --   < > = values in this interval not displayed.  Estimated Creatinine Clearance: 11.5 mL/min (by C-G formula based on Cr of 2.43).   Assessment: 82 yo female with elevated troponin starting on heparin drip for ACS. Pt last received heparin 5000 units SQ at 0525 on  Baseline labs are as follows: Hgb 7.9 Plt 224  INR 1.02 APTT 36  Goal of Therapy:  Heparin level 0.3-0.7 units/ml Monitor platelets by anticoagulation protocol: Yes   Plan:  Heparin level therapeutic. Continue current rate. Pharmacy will continue to monitor daily.   Carola Frost, Pharm.D., BCPS Clinical Pharmacist 01/05/2016,4:16 AM

## 2016-01-06 ENCOUNTER — Inpatient Hospital Stay: Payer: Medicare PPO

## 2016-01-06 LAB — CBC
HEMATOCRIT: 25.6 % — AB (ref 35.0–47.0)
HEMOGLOBIN: 8.1 g/dL — AB (ref 12.0–16.0)
MCH: 25.2 pg — ABNORMAL LOW (ref 26.0–34.0)
MCHC: 31.6 g/dL — AB (ref 32.0–36.0)
MCV: 79.6 fL — ABNORMAL LOW (ref 80.0–100.0)
Platelets: 249 10*3/uL (ref 150–440)
RBC: 3.21 MIL/uL — ABNORMAL LOW (ref 3.80–5.20)
RDW: 17.9 % — ABNORMAL HIGH (ref 11.5–14.5)
WBC: 15 10*3/uL — ABNORMAL HIGH (ref 3.6–11.0)

## 2016-01-06 LAB — BLOOD GAS, ARTERIAL
ACID-BASE EXCESS: 0.5 mmol/L (ref 0.0–3.0)
Allens test (pass/fail): POSITIVE — AB
Bicarbonate: 24.6 mEq/L (ref 21.0–28.0)
FIO2: 0.28
O2 SAT: 94.3 %
PCO2 ART: 37 mmHg (ref 32.0–48.0)
PH ART: 7.43 (ref 7.350–7.450)
PO2 ART: 70 mmHg — AB (ref 83.0–108.0)
Patient temperature: 37

## 2016-01-06 LAB — GLUCOSE, CAPILLARY
GLUCOSE-CAPILLARY: 210 mg/dL — AB (ref 65–99)
Glucose-Capillary: 242 mg/dL — ABNORMAL HIGH (ref 65–99)
Glucose-Capillary: 229 mg/dL — ABNORMAL HIGH (ref 65–99)
Glucose-Capillary: 302 mg/dL — ABNORMAL HIGH (ref 65–99)

## 2016-01-06 LAB — BASIC METABOLIC PANEL
ANION GAP: 7 (ref 5–15)
BUN: 79 mg/dL — ABNORMAL HIGH (ref 6–20)
CO2: 28 mmol/L (ref 22–32)
Calcium: 8.8 mg/dL — ABNORMAL LOW (ref 8.9–10.3)
Chloride: 104 mmol/L (ref 101–111)
Creatinine, Ser: 2.21 mg/dL — ABNORMAL HIGH (ref 0.44–1.00)
GFR calc Af Amer: 22 mL/min — ABNORMAL LOW (ref >60)
GFR, EST NON AFRICAN AMERICAN: 19 mL/min — AB (ref >60)
GLUCOSE: 233 mg/dL — AB (ref 65–99)
POTASSIUM: 4.3 mmol/L (ref 3.5–5.1)
Sodium: 139 mmol/L (ref 135–145)

## 2016-01-06 MED ORDER — ATORVASTATIN CALCIUM 20 MG PO TABS
40.0000 mg | ORAL_TABLET | Freq: Every day | ORAL | Status: DC
  Administered 2016-01-06 – 2016-01-11 (×3): 40 mg via ORAL
  Filled 2016-01-06 (×3): qty 2

## 2016-01-06 MED ORDER — FUROSEMIDE 10 MG/ML IJ SOLN
20.0000 mg | Freq: Once | INTRAMUSCULAR | Status: AC
  Administered 2016-01-06: 20 mg via INTRAVENOUS
  Filled 2016-01-06: qty 2

## 2016-01-06 MED ORDER — PREDNISONE 20 MG PO TABS: 20.0000 mg | ORAL_TABLET | Freq: Once | ORAL | Status: DC

## 2016-01-06 MED ORDER — FUROSEMIDE 40 MG PO TABS
40.0000 mg | ORAL_TABLET | Freq: Every day | ORAL | Status: DC
  Administered 2016-01-06: 40 mg via ORAL
  Filled 2016-01-06: qty 1

## 2016-01-06 MED ORDER — INSULIN GLARGINE 100 UNIT/ML ~~LOC~~ SOLN
20.0000 [IU] | Freq: Every day | SUBCUTANEOUS | Status: DC
  Filled 2016-01-06: qty 0.2

## 2016-01-06 MED ORDER — NITROGLYCERIN 2 % TD OINT
1.0000 [in_us] | TOPICAL_OINTMENT | Freq: Three times a day (TID) | TRANSDERMAL | Status: DC
  Administered 2016-01-06 – 2016-01-13 (×18): 1 [in_us] via TOPICAL
  Filled 2016-01-06 (×19): qty 1

## 2016-01-06 MED ORDER — PREDNISONE 20 MG PO TABS
30.0000 mg | ORAL_TABLET | Freq: Once | ORAL | Status: DC
  Filled 2016-01-06: qty 1

## 2016-01-06 MED ORDER — GUAIFENESIN ER 600 MG PO TB12
600.0000 mg | ORAL_TABLET | Freq: Two times a day (BID) | ORAL | Status: DC
Start: 2016-01-06 — End: 2016-01-07
  Administered 2016-01-06: 600 mg via ORAL
  Filled 2016-01-06: qty 1

## 2016-01-06 MED ORDER — SENNA 8.6 MG PO TABS
1.0000 | ORAL_TABLET | Freq: Every day | ORAL | Status: DC
  Administered 2016-01-06 – 2016-01-12 (×5): 8.6 mg via ORAL
  Filled 2016-01-06 (×5): qty 1

## 2016-01-06 NOTE — Progress Notes (Addendum)
Physical Therapy Treatment Patient Details Name: Laura Jefferson MRN: [id number] DOB: Feb 11, 1925 Today's Date: 01/06/2016    History of Present Illness 82 yo F admitted for respiratory failure/ SOB, having experienced increasing hypoxia and congestion. She recently had an MI but has been cleared to participate in therapy per cardiology and RN.    PT Comments    Demonstrated stable vitals during session with SpO2 maintaining above 97% on 2L. HR maintained in lower 70s. Pt continued to have congested cough during the session. Daughter present. Ambulation deferred due to pt's fatigue. Tolerated standing well but began to fatigue after 2-3 minutes. STS performed with modA and VC to shift weight anteriorly to increase ease, with difficulty doing so.   Follow Up Recommendations  Supervision/Assistance - 24 hour;Home health PT (Family currently declining HHPT services, but would benefit.)     Equipment Recommendations       Recommendations for Other Services       Precautions / Restrictions Precautions Precautions: None Restrictions Weight Bearing Restrictions: No    Mobility  Bed Mobility               General bed mobility comments: Pt in chair, not tested.  Transfers Overall transfer level: Needs assistance Equipment used: Rolling walker (2 wheeled) Transfers: Sit to/from Stand Sit to Stand: Mod assist         General transfer comment: Moderate posterior lean causes LOB and increased need for A for balance. Able to correct some with VC.  Ambulation/Gait             General Gait Details: Not performed due to fatigue.   Stairs            Wheelchair Mobility    Modified Rankin (Stroke Patients Only)       Balance   Sitting-balance support: Feet supported Sitting balance-Leahy Scale: Fair Sitting balance - Comments: Posterior lean Postural control: Posterior lean Standing balance support: Bilateral upper extremity supported Standing  balance-Leahy Scale: Fair Standing balance comment: Moderate posterior lean causes LOB with assistance and VC for correction to improve balance.                    Cognition Arousal/Alertness: Awake/alert Behavior During Therapy: WFL for tasks assessed/performed Overall Cognitive Status: Within Functional Limits for tasks assessed                      Exercises Other Exercises Other Exercises: B LE therex: long sitting ankle pumps, SLR and hip abd/add x10 each; seated LAQ x10. VC/TC for technique and staying on task. Standing march with UE support x10. Other Exercises: Static standing for increased endurance and strength with FWW and minA x5 min and x3 min with maximal VC for postural correction.     General Comments        Pertinent Vitals/Pain Pain Assessment: No/denies pain    Home Living                      Prior Function            PT Goals (current goals can now be found in the care plan section) Acute Rehab PT Goals PT Goal Formulation: With patient/family Time For Goal Achievement: 01/16/16 Potential to Achieve Goals: Fair Progress towards PT goals: Progressing toward goals    Frequency  Min 2X/week    PT Plan Current plan remains appropriate    Co-evaluation  End of Session           Time: 4401-0272 PT Time Calculation (min) (ACUTE ONLY): 31 min  Charges:  $Therapeutic Exercise: 23-37 mins                    G Codes:      Adelene Idler, PT, DPT  01/06/2016, 3:53 PM [phone]

## 2016-01-06 NOTE — Progress Notes (Signed)
[facility]   PATIENT NAME: Laura Jefferson    MR#:  [id number]  DATE OF BIRTH:  25-Aug-1925  SUBJECTIVE:  Patient admitted 01/01/16 for shortness of breath requiring BiPAP therapy emergency department, she is on 1 L nasal cannulas at baseline,  she had evidence of congestive heart failure received IV diuresis. Clinically she has improved she is unable to provide any meaningful information given mental status/medical conditions, no complaints at this time Few  days ago she was taken off oxygen per nasal cannulas and was diaphoretic off oxygen, O2 sats were low in 70s on room air and oxygen was reapplied. Patient had received nebulizers as needed, antibiotic Zithromax, prednisone. She is not able to tell me if she is improving Labs 01/03/16 in the morning revealed a troponin level elevated at 43. Patient denied any chest pains or discomfort. EKG revealed multiple ST-T abnormalities. Cardiology consultation was obtained, echocardiogram was ordered, unremarkable, cardiac catheterization was  not recommended. Urine and Plavix were discontinued due to worsening anemia , but no active bleeding was noted . Physical therapist consult recommended home health services. However, patient's family refused it.  REVI EW OF SYSTEMS:  Unable to obtain given patient's mental status medical condition  DRUG ALLERGIES:  No Known Allergies  VITALS:  Blood pressure 160/62, pulse 73, temperature 97.8 F (36.6 C), temperature source Oral, resp. rate 18, height 5' (1.524 m), weight 48.943 kg (107 lb 14.4 oz), SpO2 100 %.  PHYSICAL EXAMINATION:   VITAL SIGNS: Filed Vitals:   01/06/16 1134 01/06/16 1529  BP: 160/62   Pulse: 74 73  Temp: 97.8 F (36.6 C)   Resp:     GENERAL:82 y.o.female moderate distress given mental status. Back on oxygen through nasal cannulas,  somewhat dyspneic while talking to me HEAD: Normocephalic, atraumatic.  EYES: Pupils equal, round,  reactive to light. Unable to assess extraocular muscles given mental status/medical condition. No scleral icterus.  MOUTH: Dry mucosal membrane. Dentition intact. No abscess noted.  EAR, NOSE, THROAT: Clear without exudates. No external lesions.  NECK: Supple. No thyromegaly. No nodules. No JVD.  PULMONARY: Coarse rhonchi with scattered crackles, diminished breath sounds, intermittent use of accessory muscles , good respiratory effort, poor air entry bilaterally, tachypneic  CHEST: Nontender to palpation.  CARDIOVASCULAR: S1 and S2. Regular rate and rhythm. No murmurs, rubs, or gallops. Trace edema. Pedal pulses 2+ bilaterally.  GASTROINTESTINAL: Soft, nontender, nondistended. No masses. Positive bowel sounds. No hepatosplenomegaly.  MUSCULOSKELETAL: No swelling, clubbing, or edema. Range of motion full in all extremities.  NEUROLOGIC: Unable to assess given mental status/medical condition SKIN: Multiple areas of ecchymosis all extremities No ulceration, lesions, rashes, or cyanosis. Skin warm and dry. Turgor intact.  PSYCHIATRIC: Unable to assess given mental status/medical condition        LABORATORY PANEL:   CBC  Recent Labs Lab 01/06/16 0544  WBC 15.0*  HGB 8.1*  HCT 25.6*  PLT 249   ------------------------------------------------------------------------------------------------------------------  Chemistries   Recent Labs Lab 01/06/16 0544  NA 139  K 4.3  CL 104  CO2 28  GLUCOSE 233*  BUN 79*  CREATININE 2.21*  CALCIUM 8.8*   ------------------------------------------------------------------------------------------------------------------  Cardiac Enzymes  Recent Labs Lab 01/05/16 0756  TROPONINI 20.80*   ------------------------------------------------------------------------------------------------------------------  RADIOLOGY:  Dg Chest Port 1 View  01/06/2016  CLINICAL DATA:  Congestive failure EXAM: PORTABLE CHEST 1 VIEW COMPARISON:  01/01/2016  FINDINGS: Cardiac shadow is stable. Small left-sided pleural effusion is now seen. Stable vascular congestion  is noted without significant interstitial edema. No focal infiltrate is seen. Aortic calcifications are again noted. IMPRESSION: Relatively stable appearance of the chest with slight increase in left pleural effusion. Electronically Signed   By: Mark  Lukens M.D.  Alcide Clever7/2017 07:54    EKG:   Orders placed or performed during the hospital encounter of 01/01/16  . EKG 12-Lead  . EKG 12-Lead  . EKG 12-Lead  . EKG 12-Lead    ASSESSMENT AND PLAN:  82 year old female DO NOT RESUSCITATE history of systolic congestive heart failure, COPD chronic respiratory failure 1 L nasal cannula who presented with shortness of breath respiratory failure on2/2/17.  1. Acute on chronic respiratory failure with hypoxia: Secondary to acute on chronic systolic congestive heart failure as well as COPD exacerbation: The patient has received IV diuresis with adequate urine output,  however her renal function  worsened,  so diuresis was placed on hold. Continuous supplemental oxygen, and DuoNeb treatments,  prednisone, Zithromax, improved and stable clinically,, repeated  x-ray revealed worsening left pleural effusion, resume Lasix orally   2. COPD exacerbation: Supplemental oxygen, DuoNeb treatments, continue steroids ,  azithromycin, continue home medications, wheezing resolved   3. Acute kidney injury on chronic kidney disease: Secondary to diuresis decreased diuresis follow urine output renal function. Kidney function has not  Improved much, creatinine was 1.9-2.0 in August 2016   3. Type 2 diabetes with hyperglycemia, steroid-induced hyperglycemia decreasing steroids  diabetic note appreciated, continue insulin Lantus at 20 tonight  Continue sliding scale coverage   4. Essential hypertension: Lopressor, blood pressure is well controlled  5. Anxiety not otherwise specified Valium  6. Venous  thromboembolic prophylactic: Heparin subcutaneous  7. Generalized weakness, physical therapist recommends 24-hour supervision/assistance, we'll discussed with care management discharge planning  8. Non-Q-wave MI, appreciate cardiology's input, Continue patient on metoprolol , now on advanced dose,. aspirin ,Nitroglycerin topically  , off Plavix and  Heparin due to anemia . Marland Kitchen Echis normal, cardiologist, as discussed this patient. Further care . 9. Anemia, etiology is unclear, although very likely anemia of chronic disease due to CK D, however, worsened with a heparin infusion, now holding heparin and Plavix, repeating a hemoglobin level tomorrow morning. If it is improved,  restarting Plavix again        All the records are reviewed and case discussed with Care Management/Social Workerr. Management plans discussed with the patient, family and they are in agreement.  CODE STATUS: DO NOT RESUSCITATE  TOTAL TIME TAKING CARE OF THIS PATIEnt    POSSIBLE D/C IN 2-3 DAYS, DEPENDING ON CLINICAL CONDITION.   Katharina Caper M.D on 01/06/2016 at 5:22 PM  Between 7am to 6pm - Pager - [phone]  After 6pm: House Pager: - [phone]  [facility]  Office  [phone]  CC: Primary care physician; Jerl Mina, MD

## 2016-01-06 NOTE — Progress Notes (Signed)
Inpatient Diabetes Program Recommendations  AACE/ADA: New Consensus Statement on Inpatient Glycemic Control (2015)  Target Ranges:  Prepandial:   less than 140 mg/dL      Peak postprandial:   less than 180 mg/dL (1-2 hours)      Critically ill patients:  140 - 180 mg/dL   Review of Glycemic Control  Results for Laura Jefferson, Laura Jefferson (MRN [id number]) as of 01/06/2016 09:19  Ref. Range 01/05/2016 07:26 01/05/2016 11:25 01/05/2016 16:49 01/05/2016 21:12 01/06/2016 07:38  Glucose-Capillary Latest Ref Range: 65-99 mg/dL 170 (H) 017 (H) 494 (H) 260 (H) 242 (H)    Diabetes history: DM2 Outpatient Diabetes medications: Glipizide XL 10 mg daily, Metformin 1000 mg QAM, Metformin 500 mg QPM Current orders for Inpatient glycemic control: Novolog 0-9 units TID with meals, Novolog 0-5 units QHS, Lantus 16 units qam, * prednisone 40 qam  Inpatient Diabetes Program Recommendations: Insulin - Basal: Fasting glucose 242 mg/dl this morning, consider increasing Lantus to 20 units starting tomorrow.    Susette Racer, RN, BA, MHA, CDE Diabetes Coordinator Inpatient Diabetes Program  [phone] (Team Pager) [phone] [facility] Office) 01/06/2016 9:24 AM

## 2016-01-06 NOTE — Progress Notes (Signed)
OOB in chair. Alarm in place. No disterss noted

## 2016-01-06 NOTE — Care Management (Signed)
Patient will also be followed by [facility]

## 2016-01-06 NOTE — Progress Notes (Signed)
Pt in bed HOB up . No distressnoted

## 2016-01-06 NOTE — Progress Notes (Signed)
Per patient family requested to assess patient status. Patient lungs sound like fluid present and like gargling when breathing. Pt more lethargic than prior baseline. Vital signs stable. MD notified. Acknowledged. New orders placed. Will continue to monitor.

## 2016-01-06 NOTE — Progress Notes (Signed)
Hospital Problem List     Principal Problem:   Acute on chronic respiratory failure with hypoxia (HCC) Active Problems:   Acute on chronic combined systolic and diastolic CHF (congestive heart failure) (HCC)   Paroxysmal a-fib   COPD exacerbation (HCC)   Type 2 diabetes mellitus (HCC)   Anxiety   Acute respiratory failure with hypoxia [facility])    Patient Profile:   82 y.o. female w/ PMH of chronic combined systolic and diastolic CHF (EF 61-53% by echo in 02/2015), COPD (on home O2), PAF, PVD, Stage 4 CKD, and Dementia who presented to [facility] on 01/01/2016 for worsening dyspnea. Troponin was checked this admission and elevated to 42 on 01/04/2016. This is being medically managed.  Subjective   The patient is sitting up in her chair, alert. The patient's daughter is present and has many questions concerning her recent MI and current respiratory status. The patient continues to deny any chest pain, radiating pain, or shortness of breath.  Inpatient Medications    . antiseptic oral rinse  7 mL Mouth Rinse BID  . docusate sodium  100 mg Oral BID  . furosemide  40 mg Oral Daily  . heparin subcutaneous  5,000 Units Subcutaneous 3 times per day  . insulin aspart  0-5 Units Subcutaneous QHS  . insulin aspart  0-9 Units Subcutaneous TID WC  . insulin glargine  16 Units Subcutaneous Daily  . metoprolol tartrate  25 mg Oral Q6H  . nitroGLYCERIN  0.5 inch Topical 3 times per day  . pantoprazole  40 mg Oral Daily  . PARoxetine  20 mg Oral Daily  . predniSONE  40 mg Oral Q breakfast  . simvastatin  20 mg Oral QHS  . sodium chloride flush  3 mL Intravenous Q12H  . tiotropium  1 capsule Inhalation Daily    Vital Signs    Filed Vitals:   01/05/16 1125 01/05/16 2004 01/06/16 0445 01/06/16 1134  BP:  142/61 145/60 160/62  Pulse:  76 77 74  Temp:  98.5 F (36.9 C) 97.4 F (36.3 C) 97.8 F (36.6 C)  TempSrc:  Oral  Oral  Resp:  18 18   Height:      Weight:   107 lb 14.4 oz (48.943 kg)     SpO2: 96% 100% 97% 96%    Intake/Output Summary (Last 24 hours) at 01/06/16 1435 Last data filed at 01/06/16 1339  Gross per 24 hour  Intake    720 ml  Output    850 ml  Net   -130 ml   Filed Weights   01/04/16 0332 01/05/16 0630 01/06/16 0445  Weight: 107 lb 8 oz (48.762 kg) 107 lb 1.6 oz (48.58 kg) 107 lb 14.4 oz (48.943 kg)    Physical Exam    General: Well developed, well nourished, female in no acute distress. Head: Normocephalic, atraumatic.  Neck: Supple without bruits, JVD not elevated. Lungs:  Resp regular and unlabored, rhonchi present with coarse breath sounds. No rales or wheezing appreciated. Heart: RRR, S1, S2, no S3, S4, or murmur; no rub. Abdomen: Soft, non-tender, non-distended with normoactive bowel sounds. No hepatomegaly. No rebound/guarding. No obvious abdominal masses. Extremities: No clubbing, cyanosis, or edema. Distal pedal pulses are 2+ bilaterally. Neuro: Alert and oriented X 3. Moves all extremities spontaneously. Psych: Normal affect.  Labs    CBC  Recent Labs  01/05/16 0756 01/06/16 0544  WBC 13.8* 15.0*  HGB 7.6* 8.1*  HCT 23.9* 25.6*  MCV 79.6* 79.6*  PLT 217 249   Basic Metabolic Panel  Recent Labs  01/05/16 0756 01/06/16 0544  NA 136 139  K 4.6 4.3  CL 105 104  CO2 23 28  GLUCOSE 208* 233*  BUN 79* 79*  CREATININE 2.19* 2.21*  CALCIUM 8.6* 8.8*   Cardiac Enzymes  Recent Labs  01/04/16 1152 01/04/16 1757 01/05/16 0756  TROPONINI 32.57* 28.59* 20.80*   Fasting Lipid Panel  Recent Labs  01/04/16 0802  CHOL 115  HDL 33*  LDLCALC 47  TRIG 161*  CHOLHDL 3.5     Telemetry    NSR, HR in 70's - 80's. 9 beats NSVT overnight.  ECG    No new tracing.   Cardiac Studies and Radiology    Dg Chest Port 1 View: 01/06/2016  CLINICAL DATA:  Congestive failure EXAM: PORTABLE CHEST 1 VIEW COMPARISON:  01/01/2016 FINDINGS: Cardiac shadow is stable. Small left-sided pleural effusion is now seen. Stable vascular  congestion is noted without significant interstitial edema. No focal infiltrate is seen. Aortic calcifications are again noted. IMPRESSION: Relatively stable appearance of the chest with slight increase in left pleural effusion. Electronically Signed   By: Alcide Clever M.D.   On: 01/06/2016 07:54   Dg Chest Port 1 View  01/01/2016  CLINICAL DATA:  Respiratory distress.  Shortness of breath. EXAM: PORTABLE CHEST 1 VIEW COMPARISON:  07/22/2015 FINDINGS: Stable cardiomegaly. Mild pulmonary edema, with decreased in the interim. Small pleural effusions suspected. No confluent airspace disease. No pneumothorax. Chronic changes about shoulders. IMPRESSION: Findings consistent with mild CHF. Electronically Signed   By: Rubye Oaks M.D.   On: 01/01/2016 23:24    Echocardiogram: 01/03/2016: Normal ejection fraction, mild to moderate mitral regurgitation, mildly dilated left atrium, and moderate to severely elevated PA pressure of 62.   Assessment & Plan    1. Acute ST elevation MI - patient denies ever having any chest pain. Troponin peaked at 42.47 on 01/04/2016. Down-trending to 20.80 on 01/05/2016. -Medically managed at this time per the families wishes with IV Heparin gtt, ASA and Plavix. Given the patient did not have any chest discomfort, current down trending troponin, and underlying comorbidities this treatment course is still felt to be appropriate. - Was started on Heparin along with  of Plavix on 01/04/2016. Hgb trended down to 7.4 on 01/05/2016 (was 9.0 on admission). This was discontinued on 01/05/2016 and her Hgb has improved to 8.1 today. - continue BB and statin. Hopefully will be able to resume ASA and Plavix on 01/07/2016 if Hgb continues to trend upwards.  2. Acute on chronic Anemia - Hgb 9.0 on admission. Dropped to 7.4 on 01/05/2016. Improved to 8.1 on 01/06/2016. - As above, hold aspirin, Plavix, and heparin gtt at this time. Might be able to restart ASA and Plavix on 01/07/2016 if Hgb continues  to trend upward.  3. Acute on chronic combined CHF -EF 40-45% by echo in 02/2015. -BNP 316 on admission (had been elevated to 4000 three months ago) and CXR showing mild CHF -Continue to hold IV Lasix given renal function.  4. Acute on CKD stage IV - Creatinine 2.03 on admission. Baseline 1.9 - 2.0 - elevated to 2.43 on 01/05/2016 following administration of IV Lasix. Trending down to 2.21 on 01/06/2016. - would continue to hold IV Lasix at this time. Recheck BMET in AM.  5. PAF - Reported a history of PAF but it has never been documented. Maintaining NSR at this time. - Would not start anticoagulation until she has  a recorded episode of PAF due to her frail appearing state and worsening dementia.  - Continue Lopressor  BID.  Signed, Ellsworth Lennox , PA-C 2:35 PM 01/06/2016 Pager: [phone]

## 2016-01-06 NOTE — Progress Notes (Signed)
  Echo was read by Dr. Mariah Milling on 01/03/2016 but did not transfer over to [facility]. Per Dr. Mariah Milling her echo shows normal ejection fraction, mild to moderate mitral regurgitation, mildly dilated left atrium, and moderate to severely elevated PA pressure of 62.   Signed, Ellsworth Lennox, PA-C 01/06/2016, 11:05 AM Pager: [phone]

## 2016-01-07 ENCOUNTER — Inpatient Hospital Stay: Payer: Medicare PPO

## 2016-01-07 DIAGNOSIS — I214 Non-ST elevation (NSTEMI) myocardial infarction: Secondary | ICD-10-CM | POA: Diagnosis present

## 2016-01-07 DIAGNOSIS — I480 Paroxysmal atrial fibrillation: Secondary | ICD-10-CM

## 2016-01-07 LAB — GLUCOSE, CAPILLARY
GLUCOSE-CAPILLARY: 207 mg/dL — AB (ref 65–99)
GLUCOSE-CAPILLARY: 137 mg/dL — AB (ref 65–99)
GLUCOSE-CAPILLARY: 142 mg/dL — AB (ref 65–99)
Glucose-Capillary: 181 mg/dL — ABNORMAL HIGH (ref 65–99)

## 2016-01-07 LAB — BRAIN NATRIURETIC PEPTIDE

## 2016-01-07 LAB — BASIC METABOLIC PANEL
ANION GAP: 9 (ref 5–15)
BUN: 81 mg/dL — ABNORMAL HIGH (ref 6–20)
CALCIUM: 8.9 mg/dL (ref 8.9–10.3)
CO2: 28 mmol/L (ref 22–32)
CREATININE: 2.29 mg/dL — AB (ref 0.44–1.00)
Chloride: 106 mmol/L (ref 101–111)
GFR, EST AFRICAN AMERICAN: 21 mL/min — AB (ref >60)
GFR, EST NON AFRICAN AMERICAN: 18 mL/min — AB (ref >60)
Glucose, Bld: 201 mg/dL — ABNORMAL HIGH (ref 65–99)
Potassium: 4.4 mmol/L (ref 3.5–5.1)
SODIUM: 143 mmol/L (ref 135–145)

## 2016-01-07 LAB — CBC
HCT: 26.2 % — ABNORMAL LOW (ref 35.0–47.0)
HEMOGLOBIN: 8.3 g/dL — AB (ref 12.0–16.0)
MCH: 25.1 pg — AB (ref 26.0–34.0)
MCHC: 31.5 g/dL — ABNORMAL LOW (ref 32.0–36.0)
MCV: 79.6 fL — ABNORMAL LOW (ref 80.0–100.0)
PLATELETS: 243 10*3/uL (ref 150–440)
RBC: 3.29 MIL/uL — AB (ref 3.80–5.20)
RDW: 18 % — ABNORMAL HIGH (ref 11.5–14.5)
WBC: 15.2 10*3/uL — AB (ref 3.6–11.0)

## 2016-01-07 LAB — TROPONIN I: Troponin I: 11.08 ng/mL — ABNORMAL HIGH (ref <0.031)

## 2016-01-07 LAB — PROCALCITONIN: PROCALCITONIN: 0.51 ng/mL

## 2016-01-07 LAB — MRSA PCR SCREENING: MRSA by PCR: POSITIVE — AB

## 2016-01-07 MED ORDER — BUDESONIDE 0.25 MG/2ML IN SUSP
0.2500 mg | Freq: Four times a day (QID) | RESPIRATORY_TRACT | Status: DC
Start: 2016-01-08 — End: 2016-01-09
  Administered 2016-01-08 – 2016-01-09 (×6): 0.25 mg via RESPIRATORY_TRACT
  Filled 2016-01-07 (×7): qty 2

## 2016-01-07 MED ORDER — VANCOMYCIN HCL IN DEXTROSE 750-5 MG/150ML-% IV SOLN
750.0000 mg | Freq: Once | INTRAVENOUS | Status: AC
  Administered 2016-01-07: 750 mg via INTRAVENOUS
  Filled 2016-01-07: qty 150

## 2016-01-07 MED ORDER — HYDRALAZINE HCL 20 MG/ML IJ SOLN: 10.0000 mg | INTRAMUSCULAR | Status: DC | PRN

## 2016-01-07 MED ORDER — METOPROLOL TARTRATE 1 MG/ML IV SOLN
INTRAVENOUS | Status: AC
  Administered 2016-01-07: 2.5 mg via INTRAVENOUS
  Filled 2016-01-07: qty 5

## 2016-01-07 MED ORDER — MUPIROCIN 2 % EX OINT
1.0000 "application " | TOPICAL_OINTMENT | Freq: Two times a day (BID) | CUTANEOUS | Status: AC
  Administered 2016-01-07 – 2016-01-11 (×10): 1 via NASAL
  Filled 2016-01-07: qty 22

## 2016-01-07 MED ORDER — ALBUTEROL SULFATE (2.5 MG/3ML) 0.083% IN NEBU
2.5000 mg | INHALATION_SOLUTION | RESPIRATORY_TRACT | Status: DC | PRN
  Administered 2016-01-10: 2.5 mg via RESPIRATORY_TRACT
  Filled 2016-01-07: qty 3

## 2016-01-07 MED ORDER — MORPHINE SULFATE (PF) 2 MG/ML IV SOLN
1.0000 mg | INTRAVENOUS | Status: DC | PRN
  Administered 2016-01-07: 1 mg via INTRAVENOUS
  Administered 2016-01-08 – 2016-01-10 (×2): 2 mg via INTRAVENOUS
  Administered 2016-01-11: 1 mg via INTRAVENOUS
  Filled 2016-01-07 (×4): qty 1

## 2016-01-07 MED ORDER — BUDESONIDE 0.25 MG/2ML IN SUSP
0.2500 mg | Freq: Four times a day (QID) | RESPIRATORY_TRACT | Status: DC
  Administered 2016-01-07: 0.25 mg via RESPIRATORY_TRACT
  Filled 2016-01-07: qty 2

## 2016-01-07 MED ORDER — PIPERACILLIN-TAZOBACTAM 3.375 G IVPB
3.3750 g | Freq: Two times a day (BID) | INTRAVENOUS | Status: DC
  Administered 2016-01-07 – 2016-01-12 (×11): 3.375 g via INTRAVENOUS
  Filled 2016-01-07 (×13): qty 50

## 2016-01-07 MED ORDER — METOPROLOL TARTRATE 1 MG/ML IV SOLN
2.5000 mg | Freq: Once | INTRAVENOUS | Status: AC
  Administered 2016-01-07: 2.5 mg via INTRAVENOUS

## 2016-01-07 MED ORDER — METHYLPREDNISOLONE SODIUM SUCC 125 MG IJ SOLR: 60.0000 mg | INTRAMUSCULAR | Status: DC

## 2016-01-07 MED ORDER — METOPROLOL TARTRATE 1 MG/ML IV SOLN
2.5000 mg | Freq: Three times a day (TID) | INTRAVENOUS | Status: DC
  Administered 2016-01-07: 2.5 mg via INTRAVENOUS

## 2016-01-07 MED ORDER — IPRATROPIUM-ALBUTEROL 0.5-2.5 (3) MG/3ML IN SOLN
3.0000 mL | Freq: Four times a day (QID) | RESPIRATORY_TRACT | Status: DC
  Administered 2016-01-07 – 2016-01-10 (×12): 3 mL via RESPIRATORY_TRACT
  Filled 2016-01-07 (×12): qty 3

## 2016-01-07 MED ORDER — METOPROLOL TARTRATE 1 MG/ML IV SOLN: 5.0000 mg | Freq: Three times a day (TID) | INTRAVENOUS | Status: DC

## 2016-01-07 MED ORDER — SODIUM CHLORIDE 0.9 % IV SOLN
500.0000 mg | INTRAVENOUS | Status: DC
  Administered 2016-01-08 – 2016-01-12 (×3): 500 mg via INTRAVENOUS
  Filled 2016-01-07 (×4): qty 500

## 2016-01-07 MED ORDER — CHLORHEXIDINE GLUCONATE CLOTH 2 % EX PADS
6.0000 | MEDICATED_PAD | Freq: Every day | CUTANEOUS | Status: DC
  Administered 2016-01-08 – 2016-01-11 (×4): 6 via TOPICAL

## 2016-01-07 MED ORDER — METOPROLOL TARTRATE 1 MG/ML IV SOLN
2.5000 mg | Freq: Three times a day (TID) | INTRAVENOUS | Status: DC
  Administered 2016-01-07 – 2016-01-09 (×5): 2.5 mg via INTRAVENOUS
  Filled 2016-01-07 (×4): qty 5

## 2016-01-07 MED ORDER — PANTOPRAZOLE SODIUM 40 MG IV SOLR
20.0000 mg | INTRAVENOUS | Status: DC
  Administered 2016-01-07 – 2016-01-08 (×2): 20 mg via INTRAVENOUS
  Filled 2016-01-07 (×2): qty 40

## 2016-01-07 MED ORDER — INSULIN ASPART 100 UNIT/ML ~~LOC~~ SOLN
0.0000 [IU] | SUBCUTANEOUS | Status: DC
  Administered 2016-01-07: 2 [IU] via SUBCUTANEOUS
  Administered 2016-01-07: 1 [IU] via SUBCUTANEOUS
  Administered 2016-01-08 (×3): 2 [IU] via SUBCUTANEOUS
  Administered 2016-01-08 (×2): 3 [IU] via SUBCUTANEOUS
  Administered 2016-01-08: 2 [IU] via SUBCUTANEOUS
  Administered 2016-01-09: 3 [IU] via SUBCUTANEOUS
  Administered 2016-01-09 (×2): 2 [IU] via SUBCUTANEOUS
  Administered 2016-01-09: 5 [IU] via SUBCUTANEOUS
  Administered 2016-01-09: 3 [IU] via SUBCUTANEOUS
  Administered 2016-01-09: 2 [IU] via SUBCUTANEOUS
  Administered 2016-01-09: 9 [IU] via SUBCUTANEOUS
  Administered 2016-01-10 (×2): 7 [IU] via SUBCUTANEOUS
  Administered 2016-01-10: 2 [IU] via SUBCUTANEOUS
  Filled 2016-01-07: qty 2
  Filled 2016-01-07 (×2): qty 3
  Filled 2016-01-07 (×2): qty 2
  Filled 2016-01-07: qty 5
  Filled 2016-01-07: qty 3
  Filled 2016-01-07 (×3): qty 2
  Filled 2016-01-07: qty 9
  Filled 2016-01-07: qty 7
  Filled 2016-01-07: qty 1
  Filled 2016-01-07: qty 3
  Filled 2016-01-07 (×3): qty 2
  Filled 2016-01-07: qty 3
  Filled 2016-01-07: qty 7

## 2016-01-07 MED ORDER — METHYLPREDNISOLONE SODIUM SUCC 40 MG IJ SOLR
40.0000 mg | Freq: Two times a day (BID) | INTRAMUSCULAR | Status: DC
  Administered 2016-01-07 – 2016-01-09 (×4): 40 mg via INTRAVENOUS
  Filled 2016-01-07 (×4): qty 1

## 2016-01-07 NOTE — Care Management (Signed)
Patient has received home health through [facility] in the past when discharged  03/2015 with similar sx and chose [facility]  agency as could transition to hospice plan of care if indicated.  She also had a hospital bed.  Spoke with daughter Briant Cedar regarding consideration for similar plan or hospice at home if patient able to discharge home.  [name] says that physicians have told her to call in the family .  She stated that patient is under comfort care.  Do not see comfort care orders.  Patient is DNR and remains on bipap

## 2016-01-07 NOTE — Progress Notes (Signed)
Pt drowsy and now on BIPAP and has scheduled PO metoprolol. Pt is unable to safely take PO meds at this time. Dr. Anne Hahn notified and gave verbal orders to hold PO metoprolol and give one time dose of 2.5mg  IV metoprolol. Will continue to monitor.

## 2016-01-07 NOTE — Care Management (Signed)
Patient was transferred to icu due to increased respiratory distress that required bipap.  She remains a DNR and is on chronic 02 in the home.  has been followed by [facility].  Patient would benefit from palliative care consult for goals of treatment.  Her echo results were essentially wnl.

## 2016-01-07 NOTE — Progress Notes (Signed)
eLink Physician-Brief Progress Note Patient Name: Laura Jefferson DOB: 02/09/1925 MRN: [id number]   Date of Service  01/07/2016  HPI/Events of Note  Discussion with Son, Daughter, & other family members via camera regarding goals of care. Explained the concept of "comfort care". Did offer to break her off BiPAP to allow better communications. Family in agreement with no aggressive interventions.  eICU Interventions  1. Continue BiPAP for patient's comfort 2. Continue PRN IV morphine 3. D/C AM labs for now     Intervention Category Major Interventions: Code management / supervision  Laura Jefferson 01/07/2016, 8:00 PM

## 2016-01-07 NOTE — Progress Notes (Signed)
Pt has had two runs of SVT. Not showing an signs of distress. Plan is to make pt full comfort care tomorrow 01/08/16. Morphine pushes available as needed. Dr. Belia Heman notified. No new orders given.

## 2016-01-07 NOTE — Consult Note (Signed)
PULMONARY / CRITICAL CARE MEDICINE   Name: Laura Jefferson MRN: [id number] DOB: August 04, 1925    ADMISSION DATE:  01/01/2016   CONSULTATION DATE:  01/07/2016  REFERRING MD:  Dr Ether Griffins  CHIEF COMPLAINT:  Acute respiratory dsitress  HISTORY OF PRESENT ILLNESS:   This is an 82 yo white female with a PMH of COPD on home O2, NSTEMI, dementia, Afib, CHF, hypertension, CKD, and type 2 DM who presented on 12/31/05 with acute shortness of breath and decreased LOC. Patient lives with her daughter from whom some of the history is obtained. Per daughter, patient was in her usual state of health when about a week ago she gradually became more short of breath, orthopniec and fatigued. She was seen by her PCP and started on Z-pak and prednisone for possible COPD exacerbation. Despite antibiotics, oxygen and duonebs, symptoms got worse on 02/02/17and EMS was called. Upon EMS arrival, patient unresponsive. She was placed on CPAP  and given duoneb and then brought to the ED. AT the ED her SPO2 was 90%. Her initial WBC was 19.2 and her CXR did not show any acute airspace disease.  Her mental status improved with BiPAP, lasix and nebs but on 02/04 morning, patient was noted to be diaphoretic and hypoxic with SPO2 of 75%. Apparently patient had taken her O2 off. She was placed back on O2 and subsequently transitioned to prn BiPAP. However, on 02/05, her troponin went from 0.03 upon admission to 42.45. She was started on heparin and plavix load on 02/05 but her hemoglobin dropped from 9.0 to 7.4 the next day hence both treatments were discontinued.  On 02/07, patient became more lethargic and required continuous BiPAP. She was transferred to the ICU and PCCM was consulted. She awakens to voice and touch but unable to follow commands.   PAST MEDICAL HISTORY :  She  has a past medical history of Muscle weakness; Non-ST elevated myocardial infarction [facility]); Humerus fracture; Dysphagia; Anemia; Paroxysmal a-fib ([place]); Chronic  systolic CHF (congestive heart failure) (HCC); COPD (chronic obstructive pulmonary disease) ([place]); Hypertensive chronic kidney disease; Diabetes mellitus without complication ([place]); PVD (peripheral vascular disease) ([place]); Dementia; and Anxiety disorder.  PAST SURGICAL HISTORY: She  has past surgical history that includes arm surgery; Peripheral arterial stent graft; and Esophagogastroduodenoscopy (egd) with propofol (N/A, 11/06/2015).  No Known Allergies  No current facility-administered medications on file prior to encounter.   Current Outpatient Prescriptions on File Prior to Encounter  Medication Sig  . albuterol (PROVENTIL HFA;VENTOLIN HFA) 108 (90 BASE) MCG/ACT inhaler Inhale 1 puff into the lungs every 6 (six) hours as needed for wheezing or shortness of breath.  Marland Kitchen aspirin EC 81 MG tablet Take 81 mg by mouth at bedtime.  . Cholecalciferol (VITAMIN D) 2000 UNITS tablet Take 2,000 Units by mouth daily.  . diazepam (VALIUM) 10 MG tablet Take 10 mg by mouth daily as needed for anxiety.  . docusate sodium (COLACE) 100 MG capsule Take 100-200 mg by mouth 2 (two) times daily as needed for mild constipation.  . folic acid (FOLVITE) 1 MG tablet Take 1 mg by mouth at bedtime.  . furosemide (LASIX) 40 MG tablet Take 40 mg by mouth daily.   Marland Kitchen glipiZIDE (GLUCOTROL XL) 10 MG 24 hr tablet Take 10 mg by mouth daily.  Marland Kitchen HYDROcodone-acetaminophen (NORCO/VICODIN) 5-325 MG per tablet Take 1 tablet by mouth every 6 (six) hours as needed for moderate pain.  . metFORMIN (GLUCOPHAGE) 500 MG tablet Take by mouth. Takes 1000 mg am and  500 mg pm daily.  . metoprolol tartrate (LOPRESSOR) 25 MG tablet Take 25 mg by mouth 2 (two) times daily.  . Omeprazole (PRILOSEC PO) Take by mouth daily.  Marland Kitchen PARoxetine (PAXIL) 20 MG tablet Take 20 mg by mouth daily.  . psyllium (METAMUCIL) 58.6 % packet Take 1 packet by mouth daily as needed (for constipation).  . risperiDONE (RISPERDAL) 0.5 MG tablet Take 0.5 mg by mouth at  bedtime.   . simvastatin (ZOCOR) 20 MG tablet Take 20 mg by mouth at bedtime.   . Tiotropium Bromide Monohydrate 2.5 MCG/ACT AERS Inhale 2 puffs into the lungs daily.   . vitamin B-12 (CYANOCOBALAMIN) 1000 MCG tablet Take 1,000 mcg by mouth daily.    FAMILY HISTORY:  Her indicated that her mother is deceased. She indicated that her father is deceased.   SOCIAL HISTORY: She  reports that she quit smoking about 10 months ago. Her smoking use included Cigarettes. She quit after 70 years of use. She does not have any smokeless tobacco history on file. She reports that she does not drink alcohol or use illicit drugs.  REVIEW OF SYSTEMS:   Unable to obtain  SUBJECTIVE:    VITAL SIGNS: BP 152/52 mmHg  Pulse 79  Temp(Src) 99 F (37.2 C) (Oral)  Resp 25  Ht 5' (1.524 m)  Wt 107 lb 14.4 oz (48.943 kg)  BMI 21.07 kg/m2  SpO2 92%  HEMODYNAMICS:    VENTILATOR SETTINGS:    INTAKE / OUTPUT: I/O last 3 completed shifts: In: 9024 [P.O.:840; IV Piggyback:200] Out: 650 [Urine:650]  PHYSICAL EXAMINATION: General: Frail, and moderate respiratory distress  Neuro: somnolent, not following commands  HEENT: BiPAP mask, PERRLA, oral mucosa pink Cardiovascular: RRR, S1/S2, no MRG Lungs: Labored, +rhonchi and crackles  Abdomen:  Soft, NT/ND, +BS Musculoskeletal: No joint deformities   Skin:  Warm, no cyanosis  LABS:  BMET  Recent Labs Lab 01/05/16 0756 01/06/16 0544 01/07/16 0426  NA 136 139 143  K 4.6 4.3 4.4  CL 105 104 106  CO2 23 28 28   BUN 79* 79* 81*  CREATININE 2.19* 2.21* 2.29*  GLUCOSE 208* 233* 201*    Electrolytes  Recent Labs Lab 01/05/16 0756 01/06/16 0544 01/07/16 0426  CALCIUM 8.6* 8.8* 8.9    CBC  Recent Labs Lab 01/05/16 0756 01/06/16 0544 01/07/16 0426  WBC 13.8* 15.0* 15.2*  HGB 7.6* 8.1* 8.3*  HCT 23.9* 25.6* 26.2*  PLT 217 249 243    Coag's  Recent Labs Lab 01/04/16 0819  APTT 36  INR 1.02    Sepsis Markers No results for  input(s): LATICACIDVEN, PROCALCITON, O2SATVEN in the last 168 hours.  ABG  Recent Labs Lab 01/06/16 2040  PHART 7.43  PCO2ART 37  PO2ART 70*    Liver Enzymes No results for input(s): AST, ALT, ALKPHOS, BILITOT, ALBUMIN in the last 168 hours.  Cardiac Enzymes  Recent Labs Lab 01/04/16 1757 01/05/16 0756 01/07/16 0426  TROPONINI 28.59* 20.80* 11.08*    Glucose  Recent Labs Lab 01/05/16 2112 01/06/16 0738 01/06/16 1103 01/06/16 1615 01/06/16 2129 01/07/16 0753  GLUCAP 260* 242* 229* 210* 302* 207*    Imaging Dg Chest 1 View  01/07/2016  CLINICAL DATA:  Acute onset of shortness of breath. Initial encounter. EXAM: CHEST 1 VIEW COMPARISON:  Chest radiograph performed 01/06/2016 FINDINGS: The lungs are well-aerated. Vascular congestion is noted. Bibasilar airspace opacities may reflect pulmonary edema or possibly pneumonia. Small bilateral pleural effusions are suspected. There is no evidence of pneumothorax. The  cardiomediastinal silhouette is borderline enlarged. No acute osseous abnormalities are seen. There is chronic deformity involving the humeral heads bilaterally. IMPRESSION: Vascular congestion and borderline cardiomegaly. Bibasilar airspace opacities may reflect pulmonary edema or possibly pneumonia. Small bilateral pleural effusions suspected. Electronically Signed   By: Garald Balding M.D.   On: 01/07/2016 00:53     STUDIES:  EF 40-45% by echo in 02/2015 02/04 ECHO pending  CULTURES: MRSA screen positive  ANTIBIOTICS: Vancomycin 02/08>> Zosyn 02/08>>  SIGNIFICANT EVENTS: 02/02>admitted with acute on chronic respiratory failure and generalized weakness 02/05>STEMI>anticoagulation started 02/06>anticoagulation discontinued d/t drop in Hg 02/08>worsening respiratory distress requiring continuous BIPAP>ICU>Comfort care   LINES/TUBES: PIVs  DISCUSSION:  82 yo female presenting with acute on chronic hypoxic respiratory failure, acute on chronic systolic  and diastolic heart failure and new acute STEMI. Per cardiology, patient is not a candidate for any invasive intervention. Overall prognosis is poor  ASSESSMENT / PLAN:  PULMONARY A: Acute hypoxic respiratory failure-Precipitated by STEMI/Worsening heart failure  Acute COPD exacerbation Doubt Pneumonia-Afebrile, Elevated WBC likely reactive due to MI P:   -BiPAP prn for comfort and titrate to [place] or VM as tolerated or if uncomfortable with BiPAP mask -Albuterol and duoneb prn -Oral care -Morphine for shortness of breath -Pulmicort neb q6h -Prednisone 66m Q12H  CARDIOVASCULAR A:  Acute STEMI Acute on chronic systolic and diastolic heart failure; [id number] P:  -Cardiology following -Continue lopressor -Hydralazine prn for SBP>160 -f/u 2d echo -Hold lasix due to worsening creatinine -Nitroglycerin patch per cardiology  RENAL A:   Acute on chronic CKD; baseline creatinine 1.9; creatinine up to 2.3 today P:   -Renally dose vancomycin -Hold diuresis today  GASTROINTESTINAL A:   ? GI blood loss- no bloody stools/or emesis while on anticoagulation but there was concern due to drop in hemoglobin on plavix load and heparin Dysphagia s/p endoscopy on 11/16/15 P:   -PPI for GI prophylaxis -Aspiration precautions  HEMATOLOGIC A:   Acute on chronic anemia P:  -Monitor CBC  INFECTIOUS A:   Leukocytosis without fever; procalcitonin level 0.51.  ?HCAP P:   - Empiric antibiotics till 02/09 and discontinue  - Culture if febrile  ENDOCRINE A:   Steroid induced hyperglycemia P:   -Blood glucose monitoring with sensitive SSI coverage  NEUROLOGIC A:   Acute hypoxic encephalopathy Dementia P:   RASS goal: n/a -Monitor -Supplemental O2 -Morphine for pain and comfort   FAMILY  - Updates: Dr. SAlva Garnetand I met with patient's son and daughter. We discussed at length regarding prognosis, current treatment options, and overall goals of care. Dr. SAlva Garnetexplained to  them that based on her troponin level, she probably had an extensive MI. Given her multiple comorbidities, advanced age and frailty, she was not deemed appropriate for any invasive interventions as this will be futile. Her poor cardiac function and baseline COPD has complicated her respiratory status. Keeping her on BiPAP for a prolonged period of time is not likely going to improve her outcome. The family agreed that they would like her goal of care should be comfort. BiPAP is to be used fr comfort only and be discontinued if interfering with patient's comfort. All questions answered, support provided  - Inter-disciplinary family meet or Palliative Care meeting due by:  02/13    Magdalene S. Tukov ANP-BC [facility] and [facility] 01/07/2016, 10:18 AM  PCCM ATTENDING: I have reviewed pt's initial presentation, consultants notes and hospital database in detail.  The above assessment and plan  was formulated under my direction.  In summary: Advanced age and frailty with declining physical function prior to admission.  5 days of hospitalization with no improvement, in fact, has deteriorated in last 48 hrs Recent AMI - apparently large based on extent of elevation of troponin I Acute respiratory failure with bilateral basilar infiltrates  As noted above, we clarified goals and are emphasizing comfort as highest priority   35 minutes of independent CCM time was provided by me   Merton Border, MD;  PCCM service; Mobile [phone]

## 2016-01-07 NOTE — Progress Notes (Signed)
Alerted by patient family that patient does not seem right. Pt had become more lethargic and less responsive than prior assessment. Patient responding to voice but not talking. MD notified. Acknowledged. New orders placed. Family updated about patient condition and transfer to CCU.

## 2016-01-07 NOTE — Progress Notes (Signed)
Chaplain rounded in the unit and provided a compasioate presence and support to the patient. Said silent prayer. Chaplain Alvia Jablonski [phone] 

## 2016-01-07 NOTE — Progress Notes (Signed)
Pt taken off of BIPAP after an episode of emesis. PRN ondansetron given. Now on 3L [place] and tolerating well. Dr. Anne Hahn notified. MD reviewed chart and based off of recent CxR and elevated WBC gave verbal orders for pharmacy consult to manage Vancomycin and Zosyn to treat probable pneumonia. Will continue to monitor Pt closely.

## 2016-01-07 NOTE — Progress Notes (Signed)
PT Cancellation Note  Patient Details Name: Laura Jefferson MRN: [id number] DOB: 1925-09-23   Cancelled Treatment:    Reason Eval/Treat Not Completed: Medical issues which prohibited therapy. Pt developed acute respiratory distress overnight and became more lethargic and was transferred to CCU early on 01/07/16. Current PT orders will be discontinued and will need new PT orders when pt is medically appropriate to continue therapy.   Adelene Idler, PT, DPT  01/07/2016, 8:33 AM [phone]

## 2016-01-07 NOTE — Progress Notes (Signed)
ANTIBIOTIC CONSULT NOTE - INITIAL  Pharmacy Consult for vancomycin and Zosyn dosing Indication: pneumonia  No Known Allergies  Patient Measurements: Height: 5' (152.4 cm) Weight: 107 lb 14.4 oz (48.943 kg) IBW/kg (Calculated) : 45.5 Adjusted Body Weight: 48.9kg   Vital Signs: Temp: 97.8 F (36.6 C) (02/08 0040) Temp Source: Axillary (02/08 0040) BP: 147/49 mmHg (02/08 0300) Pulse Rate: 75 (02/08 0300) Intake/Output from previous day: 02/07 0701 - 02/08 0700 In: 840 [P.O.:840] Out: -  Intake/Output from this shift:    Labs:  Recent Labs  01/05/16 0300 01/05/16 0756 01/06/16 0544  WBC 12.0* 13.8* 15.0*  HGB 7.4* 7.6* 8.1*  PLT 211 217 249  CREATININE 2.43* 2.19* 2.21*   Estimated Creatinine Clearance: 12.6 mL/min (by C-G formula based on Cr of 2.21). No results for input(s): VANCOTROUGH, VANCOPEAK, VANCORANDOM, GENTTROUGH, GENTPEAK, GENTRANDOM, TOBRATROUGH, TOBRAPEAK, TOBRARND, AMIKACINPEAK, AMIKACINTROU, AMIKACIN in the last 72 hours.   Microbiology: No results found for this or any previous visit (from the past 720 hour(s)).  Medical History: Past Medical History  Diagnosis Date  . Muscle weakness   . Non-ST elevated myocardial infarction [facility])     a. likely demand ischemia 02/2015 in the setting of ARF and acute on chronic CHF  . Humerus fracture     a. left, b. 02/2015; c. in the setting of increased weakness  . Dysphagia   . Anemia   . Paroxysmal a-fib (HCC)     a. reported a-fib, no documented EKGs showing this  . Chronic systolic CHF (congestive heart failure) (HCC)     a. echo 02/2015: EF 40-45%, mod dilated LA, mildly dilated RA, mod MR/TR, mildly elevated PASP 43 mm Hg, no evidence of intracardiac shunting, intact interatrial and interventricular septa  . COPD (chronic obstructive pulmonary disease) (HCC)     a. on home O2  . Hypertensive chronic kidney disease     a. Stage 4  . Diabetes mellitus without complication (HCC)   . PVD (peripheral  vascular disease) (HCC)   . Dementia   . Anxiety disorder     Medications:   Assessment: CXR: Bibasilar opacities, possible pneumonia No micro  Goal of Therapy:  Vancomycin trough level 15-20 mcg/ml  Plan:  Vancomycin 750 mg X1 ordered with 500 mg q 48 hours to follow as stacked dosing, Level before 4th dose, not at Css.  Zosyn 3.375 grams q 12 hours ordered.  Torez Beauregard S 01/07/2016,3:36 AM

## 2016-01-07 NOTE — Progress Notes (Signed)
[facility] - Branch at [facility]   PATIENT NAME: Laura Jefferson    MR#:  [id number]  DATE OF BIRTH:  May 26, 1925  SUBJECTIVE:  Patient admitted 01/01/16 for shortness of breath requiring BiPAP therapy emergency department, she is on 1 L nasal cannulas at baseline,  she had evidence of congestive heart failure received IV diuresis. Clinically she has improved she is unable to provide any meaningful information given mental status/medical conditions, no complaints at this time Few  days ago she was taken off oxygen per nasal cannulas and was diaphoretic off oxygen, O2 sats were low in 70s on room air and oxygen was reapplied. Patient had received nebulizers as needed, antibiotic Zithromax, prednisone. She is not able to tell me if she is improving Labs 01/03/16 in the morning revealed a troponin level elevated at 43. Patient denied any chest pains or discomfort. EKG revealed multiple ST-T abnormalities. Cardiology consultation was obtained, echocardiogram was ordered, unremarkable, cardiac catheterization was  not recommended. Heparin and Plavix were discontinued due to worsening anemia , but no active bleeding was noted .  Patient was noted to have progressive cough and rattling in the chest, shortness of breath became hypoxic and was transferred to intensive care unit for BiPAP application, she is more responsive now and not able to review systems REVI EW OF SYSTEMS:  Unable to obtain given patient's mental status medical condition  DRUG ALLERGIES:  No Known Allergies  VITALS:  Blood pressure 126/64, pulse 72, temperature 98.2 F (36.8 C), temperature source Axillary, resp. rate 30, height 5' (1.524 m), weight 48.943 kg (107 lb 14.4 oz), SpO2 94 %.  PHYSICAL EXAMINATION:   VITAL SIGNS: Filed Vitals:   01/07/16 1400 01/07/16 1500  BP: 111/58 126/64  Pulse: 28 72  Temp:    Resp: 27 30   GENERAL:82 y.o.female in moderate to severe respiratory distress , now on  BiPAP, using accessory muscles and is tachypneic HEAD: Normocephalic, atraumatic.  EYES: Pupils equal, round, reactive to light. Unable to assess extraocular muscles given mental status/medical condition. No scleral icterus.  MOUTH: Dry mucosal membrane. Dentition intact. No abscess noted.  EAR, NOSE, THROAT: Clear without exudates. No external lesions. Patient is on BiPAP, difficult to examine NECK: Supple. No thyromegaly. No nodules. No JVD.  PULMONARY: Coarse rhonchi with scattered crackles, diminished breath sounds, especially at bases bilaterally, intermittent use of accessory muscles , good respiratory effort, poor air entry bilaterally, tachypneic  CHEST: Nontender to palpation.  CARDIOVASCULAR: S1 and S2. Regular rate and rhythm. No murmurs, rubs, or gallops. No edema. Pedal pulses 2+ bilaterally.  GASTROINTESTINAL: Soft, nontender, nondistended. No masses. Positive bowel sounds. No hepatosplenomegaly.  MUSCULOSKELETAL: No swelling, clubbing, or edema. Range of motion full in all extremities.  NEUROLOGIC: Unable to assess given mental status/medical condition SKIN: Multiple areas of ecchymosis all extremities No ulceration, lesions, rashes, or cyanosis. Skin warm and dry. Turgor intact.  PSYCHIATRIC: Unable to assess given mental status/medical condition        LABORATORY PANEL:   CBC  Recent Labs Lab 01/07/16 0426  WBC 15.2*  HGB 8.3*  HCT 26.2*  PLT 243   ------------------------------------------------------------------------------------------------------------------  Chemistries   Recent Labs Lab 01/07/16 0426  NA 143  K 4.4  CL 106  CO2 28  GLUCOSE 201*  BUN 81*  CREATININE 2.29*  CALCIUM 8.9   ------------------------------------------------------------------------------------------------------------------  Cardiac Enzymes  Recent Labs Lab 01/07/16 0426  TROPONINI 11.08*    ------------------------------------------------------------------------------------------------------------------  RADIOLOGY:  Dg Chest 1 View  01/07/2016  CLINICAL DATA:  Acute onset of shortness of breath. Initial encounter. EXAM: CHEST 1 VIEW COMPARISON:  Chest radiograph performed 01/06/2016 FINDINGS: The lungs are well-aerated. Vascular congestion is noted. Bibasilar airspace opacities may reflect pulmonary edema or possibly pneumonia. Small bilateral pleural effusions are suspected. There is no evidence of pneumothorax. The cardiomediastinal silhouette is borderline enlarged. No acute osseous abnormalities are seen. There is chronic deformity involving the humeral heads bilaterally. IMPRESSION: Vascular congestion and borderline cardiomegaly. Bibasilar airspace opacities may reflect pulmonary edema or possibly pneumonia. Small bilateral pleural effusions suspected. Electronically Signed   By: Roanna Raider M.D.   On: 01/07/2016 00:53   Dg Chest Port 1 View  01/06/2016  CLINICAL DATA:  Congestive failure EXAM: PORTABLE CHEST 1 VIEW COMPARISON:  01/01/2016 FINDINGS: Cardiac shadow is stable. Small left-sided pleural effusion is now seen. Stable vascular congestion is noted without significant interstitial edema. No focal infiltrate is seen. Aortic calcifications are again noted. IMPRESSION: Relatively stable appearance of the chest with slight increase in left pleural effusion. Electronically Signed   By: Alcide Clever M.D.   On: 01/06/2016 07:54    EKG:   Orders placed or performed during the hospital encounter of 01/01/16  . EKG 12-Lead  . EKG 12-Lead  . EKG 12-Lead  . EKG 12-Lead    ASSESSMENT AND PLAN:  82 year old female DO NOT RESUSCITATE history of systolic congestive heart failure, COPD chronic respiratory failure 1 L nasal cannula who presented with shortness of breath respiratory failure on2/2/17.  1. Acute on chronic respiratory failure with hypoxia: Secondary to acute on  chronic systolic congestive heart failure as well as COPD exacerbation, suspected bilateral pneumonia: The patient has received IV diuresis with adequate urine output,  however her renal function  worsened,  so diuresis was placed on hold. Now patient is initiated on broad-spectrum antibiotic therapy which patient's family desires to keep . Patient's family does not want her to continue Lasix , as per discussion with Dr. Darrol Angel. Continue supplemental oxygen, BiPAP, DuoNeb treatments,  steroids, IV, Zosyn and vancomycin , repeated  x-ray revealed worsening vascular congestion, bibasilar airspace opacities, likely pulmonary edema versus pneumonia, small bilateral pleural effusions, patient's family decided against Lasix. She is DO NOT RESUSCITATE   2. COPD exacerbation: Supplemental oxygen, BiPAP, DuoNeb treatments, continue steroids ,  antibiotics, continue home medications, wheezing resolved   3. Acute kidney injury on chronic kidney disease: Secondary to diuresis,  diuresis was stopped due to worsening renal failure . Kidney function has not  Improved much, creatinine was 1.9-2.0 in August 2016 , it is 2.3 today  3. Type 2 diabetes with hyperglycemia, steroid-induced hyperglycemia,  improved while steroids were decreased  diabetic note appreciated, continue insulin Lantus at 20 daily, following patient's glucose levels closely. Patient is not able to take orally due to BiPAP Continue sliding scale coverage   4. Essential hypertension: Lopressor, blood pressure is well controlled  5. Anxiety not otherwise specified Valium  6. Venous thromboembolic prophylactic: Heparin subcutaneous  7. Generalized weakness, physical therapist recommends 24-hour supervision/assistance, family initially refused home health services 8. Non-Q-wave MI, appreciate cardiology's input, Continue patient on metoprolol ,  aspirin ,Nitroglycerin topically  , off Plavix and  Heparin due to anemia . Marland Kitchen Echo is normal, per  cardiologist. Troponin is declining despite  worsening respiratory status and congestive heart failure . 9. Anemia, etiology is unclear, although very likely anemia of chronic disease due to CK D, however, worsened with a heparin infusion, now holding heparin and  Plavix, repeated a hemoglobin level today is better than it was yesterday       All the records are reviewed and case discussed with Care Management/Social Workerr. Management plans discussed with the patient, family and they are in agreement.  CODE STATUS: DO NOT RESUSCITATE  TOTAL CRITICAL CARE TIME TAKING CARE OF THIS PATIEnt  40 minutes  Discussed with patient's family POSSIBLE D/C IN 2-3 DAYS, DEPENDING ON CLINICAL CONDITION.   Katharina Caper M.D on 01/07/2016 at 3:43 PM  Between 7am to 6pm - Pager - [phone]  After 6pm: House Pager: - [phone]  [facility]  Office  [phone]  CC: Primary care physician; Jerl Mina, MD

## 2016-01-07 NOTE — Progress Notes (Signed)
Patient continues on BIPAP, Dr Sung Amabile and Dr Winona Legato have made rounds and updated. DR Sung Amabile has talked with family extensively regarding pt's status and plan of care. Daughter wants pt to remain DNR heading to comfort care, priority at this time is keeping pt comfortable. Patient is comfortable, has no ss of pain or distress. Continues to be NPO. Will monitor closely.

## 2016-01-07 NOTE — Progress Notes (Addendum)
Hospital Problem List     Principal Problem:   Acute on chronic respiratory failure with hypoxia (HCC) Active Problems:   Acute on chronic combined systolic and diastolic CHF (congestive heart failure) (HCC)   Paroxysmal a-fib   COPD exacerbation (HCC)   Type 2 diabetes mellitus (HCC)   Anxiety   Acute respiratory failure with hypoxia [facility])     Patient Profile:   82 y.o. female w/ PMH of chronic combined systolic and diastolic CHF (EF 16-10% by echo in 02/2015), COPD (on home O2), PAF, PVD, Stage 4 CKD, and Dementia who presented to [facility] on 01/01/2016 for worsening dyspnea. Troponin was checked this admission and elevated to 42 on 01/04/2016. This is being medically managed.  Subjective   Developed acute respiratory distress overnight and became more lethargic. Required BiPAP and was transferred to ICU. Currently on 3L [place]. CXR obtained overnight and showed bibasilar airspace opacities concerning for pulmonary edema or PNA, not noted on previous imaging 01/06/2016. Was started on Vancomycin and Zosyn for possible PNA. This morning, the patient is awake but not responsive to questions.   Inpatient Medications    . antiseptic oral rinse  7 mL Mouth Rinse BID  . atorvastatin  40 mg Oral q1800  . Chlorhexidine Gluconate Cloth  6 each Topical Q0600  . docusate sodium  100 mg Oral BID  . guaiFENesin  600 mg Oral BID  . heparin subcutaneous  5,000 Units Subcutaneous 3 times per day  . insulin aspart  0-5 Units Subcutaneous QHS  . insulin aspart  0-9 Units Subcutaneous TID WC  . insulin glargine  20 Units Subcutaneous Daily  . metoprolol  2.5 mg Intravenous 3 times per day  . mupirocin ointment  1 application Nasal BID  . nitroGLYCERIN  1 inch Topical 3 times per day  . pantoprazole  40 mg Oral Daily  . PARoxetine  20 mg Oral Daily  . piperacillin-tazobactam (ZOSYN)  IV  3.375 g Intravenous Q12H  . [START ON 01/08/2016] predniSONE  20 mg Oral Once  . predniSONE  30 mg Oral Once  . senna   1 tablet Oral Daily  . sodium chloride flush  3 mL Intravenous Q12H  . tiotropium  1 capsule Inhalation Daily  . [START ON 01/08/2016] vancomycin  500 mg Intravenous Q48H    Vital Signs    Filed Vitals:   01/07/16 0400 01/07/16 0430 01/07/16 0500 01/07/16 0600  BP: 141/47  144/54 143/59  Pulse: 77  77 84  Temp:  99.4 F (37.4 C)    TempSrc:  Oral    Resp: 27  27 32  Height:      Weight:      SpO2: 92%  96% 95%    Intake/Output Summary (Last 24 hours) at 01/07/16 0754 Last data filed at 01/07/16 0551  Gross per 24 hour  Intake   1040 ml  Output      0 ml  Net   1040 ml   Filed Weights   01/04/16 0332 01/05/16 0630 01/06/16 0445  Weight: 107 lb 8 oz (48.762 kg) 107 lb 1.6 oz (48.58 kg) 107 lb 14.4 oz (48.943 kg)    Physical Exam    General: Well developed, well nourished, female in no acute distress. Head: Normocephalic, atraumatic.  Neck: Supple without bruits, JVD not elevated. Lungs:  Resp regular and unlabored, currently on 3L . Rhonchi present in upper lung fields and gurgling present. Heart: RRR, S1, S2, no S3, S4, or murmur;  no rub. Abdomen: Soft, non-tender, non-distended with normoactive bowel sounds. No hepatomegaly. No rebound/guarding. No obvious abdominal masses. Extremities: No clubbing, cyanosis, or edema. Distal pedal pulses are 2+ bilaterally. Neuro: Moves all extremities spontaneously. Not responding to questions. Psych: Normal affect.  Labs    CBC  Recent Labs  01/06/16 0544 01/07/16 0426  WBC 15.0* 15.2*  HGB 8.1* 8.3*  HCT 25.6* 26.2*  MCV 79.6* 79.6*  PLT 249 243   Basic Metabolic Panel  Recent Labs  01/06/16 0544 01/07/16 0426  NA 139 143  K 4.3 4.4  CL 104 106  CO2 28 28  GLUCOSE 233* 201*  BUN 79* 81*  CREATININE 2.21* 2.29*  CALCIUM 8.8* 8.9   Cardiac Enzymes  Recent Labs  01/04/16 1757 01/05/16 0756 01/07/16 0426  TROPONINI 28.59* 20.80* 11.08*   Fasting Lipid Panel  Recent Labs  01/04/16 0802  CHOL 115    HDL 33*  LDLCALC 47  TRIG 119*  CHOLHDL 3.5     Telemetry    NSR, frequent PVC's. HR in 70's - 80's.  ECG    No new tracings.   Cardiac Studies and Radiology    Dg Chest 1 View: 01/07/2016  CLINICAL DATA:  Acute onset of shortness of breath. Initial encounter. EXAM: CHEST 1 VIEW COMPARISON:  Chest radiograph performed 01/06/2016 FINDINGS: The lungs are well-aerated. Vascular congestion is noted. Bibasilar airspace opacities may reflect pulmonary edema or possibly pneumonia. Small bilateral pleural effusions are suspected. There is no evidence of pneumothorax. The cardiomediastinal silhouette is borderline enlarged. No acute osseous abnormalities are seen. There is chronic deformity involving the humeral heads bilaterally. IMPRESSION: Vascular congestion and borderline cardiomegaly. Bibasilar airspace opacities may reflect pulmonary edema or possibly pneumonia. Small bilateral pleural effusions suspected. Electronically Signed   By: Roanna Raider M.D.   On: 01/07/2016 00:53   Dg Chest Port 1 View: 01/06/2016  CLINICAL DATA:  Congestive failure EXAM: PORTABLE CHEST 1 VIEW COMPARISON:  01/01/2016 FINDINGS: Cardiac shadow is stable. Small left-sided pleural effusion is now seen. Stable vascular congestion is noted without significant interstitial edema. No focal infiltrate is seen. Aortic calcifications are again noted. IMPRESSION: Relatively stable appearance of the chest with slight increase in left pleural effusion. Electronically Signed   By: Alcide Clever M.D.   On: 01/06/2016 07:54   Dg Chest Port 1 View: 01/01/2016  CLINICAL DATA:  Respiratory distress.  Shortness of breath. EXAM: PORTABLE CHEST 1 VIEW COMPARISON:  07/22/2015 FINDINGS: Stable cardiomegaly. Mild pulmonary edema, with decreased in the interim. Small pleural effusions suspected. No confluent airspace disease. No pneumothorax. Chronic changes about shoulders. IMPRESSION: Findings consistent with mild CHF. Electronically Signed   By:  Rubye Oaks M.D.   On: 01/01/2016 23:24    Assessment & Plan    1. Acute ST elevation MI - patient denies ever having any chest pain. Troponin peaked at 42.47 on 01/04/2016. Down-trending to 20.80 on 01/05/2016. -Medically managed at this time per the families wishes with IV Heparin gtt, ASA and Plavix. Given the patient did not have any chest discomfort, current down trending troponin, and underlying comorbidities this treatment course is still felt to be appropriate. - Was started on Heparin along with  of Plavix on 01/04/2016. Hgb trended down to 7.4 on 01/05/2016 (was 9.0 on admission). This was discontinued on 01/05/2016 and her Hgb has improved to 8.3 today. - Was wanting to resume ASA and Plavix today, but she is currently not taking PO medications. Was on Lopressor  PO  Q6H. Will start Lopressor 2.5mg  IV Q8H while off PO medications with hold parameters in place for HR and BP. -- PRN Hydralazine 10 mg for SBP > 160 mmHg -- would not pursue invasive evaluation at this time given absence of symptoms & family wishes.  Especially, with recent bleed.  2. Acute on chronic Anemia - Hgb 9.0 on admission. Dropped to 7.4 on 01/05/2016. Improved to 8.3 on 01/07/2016. - Would restart ASA and Plavix once back taking PO medications.  3. Acute on chronic combined CHF -EF 40-45% by echo in 02/2015. -BNP 316 on admission (had been elevated to 4000 three months ago) and CXR showing mild CHF -Continue to hold IV Lasix given renal function. -- defer to PCCM, who feel that the acute decompensation was not related to Pulmonary Edema  4. Acute on CKD stage IV - Creatinine 2.03 on admission. Baseline 1.9 - 2.0 - elevated to 2.43 on 01/05/2016 following administration of IV Lasix. Trending down to 2.29 on 01/07/2016. - would continue to hold IV Lasix at this time.   5. PAF - Reported a history of PAF but it has never been documented. Maintaining NSR at this time. - Would not start anticoagulation until she  has a recorded episode of PAF due to her frail appearing state and worsening dementia.  - Continue Lopressor as above.  6. Acute Respiratory Distress/ PNA - Developed acute respiratory distress overnight on 01/06/2016 and became more lethargic. Required BiPAP and was transferred to ICU. CXR showed bibasilar airspace opacities concerning for pulmonary edema or PNA, not noted on previous imaging 01/06/2016. - WBC trending up to 15.2 on 01/07/2016. - Started on Vancomycin and Zosyn for possible PNA. This morning, the patient is awake but not responsive to questions. - per admitting team / PCCM  Signed, Ellsworth Lennox , PA-C 7:54 AM 01/07/2016 Pager: [phone]   I have seen, examined and evaluated the patient this AM along with Ms. Iran Ouch, PA-C.  After reviewing all the available data and chart,  I agree with her findings, examination as well as impression recommendations.  Elderly woman with presentation of A on C CHF in setting of large Troponin leak -- no suggestion of anginal pain, but clearly ischemia mediated.  Based upon initial conversations & recommendations, would not pursue invasive evaluation.  Family wished medical management & with Hgb drop, would be reluctant to anticoagulate for cath/PCI. -- Continue BB (converted to IV for now) -- Statin -- ASA & Plavix once hgb stable & taking PO -- Echo done - report pending read.  -- suspect that this will be worse; not planning more aggressive diuresis based upon clinical scenario of current exacerbation & renal insufficiency  Will follow along at a distance.   Marykay Lex, M.D., M.S. Interventional Cardiologist   Pager # [phone] Phone # [phone] [address] [place], [place] [address]

## 2016-01-07 NOTE — Progress Notes (Signed)
Nutrition Brief Note  Pt triggered for assessment due to LOS. Chart reviewed. Pt DNR, heading towards comfort care with priority keeping pt comfortable. Currently NPO, on Bipap.  No further nutrition interventions warranted at this time due to current poc.  Will sign off, Please re-consult as needed.   Romelle Starcher MS, RD, LDN [phone] Pager  [phone] Weekend/On-Call Pager

## 2016-01-08 ENCOUNTER — Inpatient Hospital Stay: Payer: Medicare PPO

## 2016-01-08 LAB — BLOOD GAS, ARTERIAL
ACID-BASE EXCESS: 0.5 mmol/L (ref 0.0–3.0)
Allens test (pass/fail): POSITIVE — AB
BICARBONATE: 24.2 meq/L (ref 21.0–28.0)
FIO2: 0.36
O2 Saturation: 99.5 %
PATIENT TEMPERATURE: 37
pCO2 arterial: 34 mmHg (ref 32.0–48.0)
pH, Arterial: 7.46 — ABNORMAL HIGH (ref 7.350–7.450)
pO2, Arterial: 158 mmHg — ABNORMAL HIGH (ref 83.0–108.0)

## 2016-01-08 LAB — GLUCOSE, CAPILLARY
GLUCOSE-CAPILLARY: 189 mg/dL — AB (ref 65–99)
GLUCOSE-CAPILLARY: 198 mg/dL — AB (ref 65–99)
GLUCOSE-CAPILLARY: 216 mg/dL — AB (ref 65–99)
GLUCOSE-CAPILLARY: 239 mg/dL — AB (ref 65–99)
Glucose-Capillary: 180 mg/dL — ABNORMAL HIGH (ref 65–99)
Glucose-Capillary: 186 mg/dL — ABNORMAL HIGH (ref 65–99)
Glucose-Capillary: 224 mg/dL — ABNORMAL HIGH (ref 65–99)

## 2016-01-08 MED ORDER — METOPROLOL TARTRATE 1 MG/ML IV SOLN
5.0000 mg | Freq: Once | INTRAVENOUS | Status: AC
  Administered 2016-01-08: 5 mg via INTRAVENOUS
  Filled 2016-01-08: qty 5

## 2016-01-08 MED ORDER — DILTIAZEM HCL 25 MG/5ML IV SOLN
15.0000 mg | Freq: Once | INTRAVENOUS | Status: AC
  Administered 2016-01-08: 15 mg via INTRAVENOUS

## 2016-01-08 MED ORDER — DILTIAZEM HCL 25 MG/5ML IV SOLN
INTRAVENOUS | Status: AC
  Administered 2016-01-08: 15 mg via INTRAVENOUS
  Filled 2016-01-08: qty 5

## 2016-01-08 MED ORDER — CLOPIDOGREL BISULFATE 75 MG PO TABS
75.0000 mg | ORAL_TABLET | Freq: Every day | ORAL | Status: DC
  Administered 2016-01-09 – 2016-01-12 (×4): 75 mg via ORAL
  Filled 2016-01-08 (×5): qty 1

## 2016-01-08 MED ORDER — ASPIRIN 81 MG PO CHEW
81.0000 mg | CHEWABLE_TABLET | Freq: Every day | ORAL | Status: DC
Start: 2016-01-08 — End: 2016-01-12
  Administered 2016-01-09 – 2016-01-12 (×4): 81 mg via ORAL
  Filled 2016-01-08 (×4): qty 1

## 2016-01-08 MED ORDER — FUROSEMIDE 10 MG/ML IJ SOLN
40.0000 mg | Freq: Two times a day (BID) | INTRAMUSCULAR | Status: DC
  Administered 2016-01-08 – 2016-01-09 (×2): 40 mg via INTRAVENOUS
  Filled 2016-01-08 (×2): qty 4

## 2016-01-08 MED ORDER — METOPROLOL TARTRATE 1 MG/ML IV SOLN
10.0000 mg | INTRAVENOUS | Status: DC | PRN
  Filled 2016-01-08 (×2): qty 10

## 2016-01-08 NOTE — Progress Notes (Signed)
Patient accepted from room 219.  Assessment complete, see flowsheet.  VSS.  Patient does not appear to be in any distress.  Family brought to bedside and updated. Nurse will continue to monitor.

## 2016-01-08 NOTE — Progress Notes (Signed)
Brief was moderately saturated

## 2016-01-08 NOTE — Progress Notes (Signed)
[facility] [place] [facility]   PATIENT NAME: Laura Jefferson    MR#:  [id number]  DATE OF BIRTH:  02-17-1925  SUBJECTIVE:  CHIEF COMPLAINT:   Chief Complaint  Patient presents with  . Respiratory Distress   - patient was on Bipap yesterday in ICU and got sent to floor as family wanted to see how she improves and possible comfort care today - However patient got more alert through the day and now very tachypneic and appears to be in distress and family requests to treat her as she might be improving - discussed again with son and daughter and also intensivist Dr. Bard Herbert- agreed to transfer her to ICU step down and use Bipap as needed - ABG and CXR ordered  REVIEW OF SYSTEMS:  Review of Systems  Unable to perform ROS: critical illness    DRUG ALLERGIES:  No Known Allergies  VITALS:  Blood pressure 161/95, pulse 110, temperature 100 F (37.8 C), temperature source Axillary, resp. rate 29, height 5' (1.524 m), weight 46.7 kg (102 lb 15.3 oz), SpO2 100 %.  PHYSICAL EXAMINATION:  Physical Exam  GENERAL:  82 y.o.-year-old patient lying in the bed with mild respiratory distress.  EYES: Pupils equal, round, reactive to light and accommodation. No scleral icterus. Extraocular muscles intact.  HEENT: Head atraumatic, normocephalic. Oropharynx and nasopharynx clear.  NECK:  Supple, no jugular venous distention. No thyroid enlargement, no tenderness.  LUNGS: scattered wheezing with occasional crackles at the bases. No rhonchi, gurgling secretions noted . Using accessory muscles to breathe. CARDIOVASCULAR: S1, S2 normal. No rubs, or gallops. 2/6 systolic murmur present,. ABDOMEN: Soft, nontender, nondistended. Bowel sounds present. No organomegaly or mass.  EXTREMITIES: No pedal edema, cyanosis, or clubbing.  NEUROLOGIC: Alert and following commands, appears to be in distress and unable to do a complete neuro exam. PSYCHIATRIC: The patient is alert.  SKIN:  No obvious rash, lesion, or ulcer.    LABORATORY PANEL:   CBC  Recent Labs Lab 01/07/16 0426  WBC 15.2*  HGB 8.3*  HCT 26.2*  PLT 243   ------------------------------------------------------------------------------------------------------------------  Chemistries   Recent Labs Lab 01/07/16 0426  NA 143  K 4.4  CL 106  CO2 28  GLUCOSE 201*  BUN 81*  CREATININE 2.29*  CALCIUM 8.9   ------------------------------------------------------------------------------------------------------------------  Cardiac Enzymes  Recent Labs Lab 01/07/16 0426  TROPONINI 11.08*   ------------------------------------------------------------------------------------------------------------------  RADIOLOGY:  Dg Chest 1 View  01/07/2016  CLINICAL DATA:  Acute onset of shortness of breath. Initial encounter. EXAM: CHEST 1 VIEW COMPARISON:  Chest radiograph performed 01/06/2016 FINDINGS: The lungs are well-aerated. Vascular congestion is noted. Bibasilar airspace opacities may reflect pulmonary edema or possibly pneumonia. Small bilateral pleural effusions are suspected. There is no evidence of pneumothorax. The cardiomediastinal silhouette is borderline enlarged. No acute osseous abnormalities are seen. There is chronic deformity involving the humeral heads bilaterally. IMPRESSION: Vascular congestion and borderline cardiomegaly. Bibasilar airspace opacities may reflect pulmonary edema or possibly pneumonia. Small bilateral pleural effusions suspected. Electronically Signed   By: Roanna Raider M.D.   On: 01/07/2016 00:53   Dg Chest Port 1 View  01/08/2016  CLINICAL DATA:  Respiratory failure with increased shortness of breath EXAM: PORTABLE CHEST 1 VIEW COMPARISON:  January 07, 2016 FINDINGS: There is persistent cardiomegaly with mild pulmonary venous hypertension. There is airspace consolidation in the left base with small left effusion. There is atelectatic change in the right base. There is  extensive atherosclerotic change in the aorta. There  is evidence of old trauma involving the proximal right humeral metaphysis. Bones are osteoporotic. IMPRESSION: Persistent pulmonary vascular congestion. Airspace consolidation left base with small left effusion. Suspect pneumonia superimposed on a degree of congestive heart failure. There is atelectatic change in the right base. Electronically Signed   By: Bretta Bang III M.D.   On: 01/08/2016 15:31    EKG:   Orders placed or performed during the hospital encounter of 01/01/16  . EKG 12-Lead  . EKG 12-Lead  . EKG 12-Lead  . EKG 12-Lead    ASSESSMENT AND PLAN:   82 year old female with history of systolic congestive heart failure, COPD chronic respiratory failure on 1 L nasal cannula who presented with shortness of breath respiratory failure on2/2/17.  1. Acute on chronic respiratory failure with hypoxia:  - Secondary to acute on chronic systolic congestive heart failure as well as COPD exacerbation, - suspected bilateral pneumonia:  - Family now wants to treat her and no comfort care- CXR with bilateral interstitial markings - ABG with no elevated co2 - discussed with Intensivist- watch in stepdown tonight for prn Bipap - Continue supplemental oxygen, DuoNeb treatments, steroids - on  IV, Zosyn and vancomycin - IV lasix restarted   2. Non-Q-wave MI, appreciate cardiology's input, Continue patient on metoprolol ,aspirin ,Nitroglycerin topically - off Plavix and Heparin due to anemia . Restart plavix - appreciate cardiology consult. - Echo is normal, per cardiologist. F/u report - significantly elevated troponin  3. Acute kidney injury on chronic kidney disease:   creatinine was 1.9-2.0 in August 2016 , - monitor as lasix being restarted  3. Type 2 diabetes with hyperglycemia, steroid-induced hyperglycemia, improved while steroids were decreased   Hold lantus as patient is NPO pending swallow eval. Continue sliding  scale coverage   4. Essential hypertension: Lopressor, blood pressure is well controlled  5. Anxiety -PRN  Valium  6. Venous thromboembolic prophylactic: Heparin subcutaneous  7. Anemia,- likely anemia of chronic disease - hb dropped with loading dose of plavix and heparin drip for NSTEMI - stable at 8.0 now, restarted plavix and also aspirin - monitor for now- transfuse as needed if hb<8.0 due to acute MI  Physical therapy consult once medically stable. Palliative care consult as well.   All the records are reviewed and case discussed with Care Management/Social Workerr. Management plans discussed with the patient, family and they are in agreement.  CODE STATUS: DNR  TOTAL CRITICAL CARE TIME SPENT IN TAKING CARE OF THIS PATIENT: 42 minutes.     Enid Baas M.D on 01/08/2016 at 6:08 PM  Between 7am to 6pm - Pager - [phone]  After 6pm go to [email or web address] - password [email or web address] [facility]  [facility]  Office  [phone]  CC: Primary care physician; Jerl Mina, MD

## 2016-01-08 NOTE — Progress Notes (Deleted)
°   01/07/16 1600  Clinical Encounter Type  Visited With Patient and family together;Health care provider  Visit Type Initial  Referral From Nurse  Consult/Referral To Chaplain  Spiritual Encounters  Spiritual Needs Emotional  Stress Factors  Family Stress Factors Loss;Major life changes  Staff asked that I speak with patient's family as they arrived in ICU.  Offered pastoral care and presence concerning end of life issues and grief of family.

## 2016-01-08 NOTE — Care Management Important Message (Signed)
Important Message  Patient Details  Name: FERRARI ZEHM MRN: [id number] Date of Birth: September 22, 1925   Medicare Important Message Given:  Yes    Berna Bue, RN 01/08/2016, 9:25 AM

## 2016-01-08 NOTE — Progress Notes (Signed)
01/08/2016  5:22 PM  Called report to receiving RN Fleet Contras at ICU/Stepdown unit and gave report, answered ll questions to her satisfaction and will now be sending pt to the unit.  Bradly Chris, RN

## 2016-01-08 NOTE — Progress Notes (Signed)
Speech Therapy Note: received order, consulted NSG then MD consulted SLP. Pt may be moved back to CCU per MD sec. to medical status. Pt is currently NPO. BSE order will be followed up on once pt is stabilized, and post potential return to CCU. MD agreed w/ meds in puree w/ possible ice chips for pleasure w/ aspiration precautions as appropriate. ST will f/u tomorrow.

## 2016-01-08 NOTE — Progress Notes (Signed)
Dr. Jeanene Erb and notified of elevated irregular heart rate.  Orders received.

## 2016-01-08 NOTE — Progress Notes (Signed)
Patient with sustained irregular heart rate 150-160's.  EKG completed showing a-fib RVR.  Order obtained.

## 2016-01-09 ENCOUNTER — Inpatient Hospital Stay: Payer: Medicare PPO

## 2016-01-09 DIAGNOSIS — Z87891 Personal history of nicotine dependence: Secondary | ICD-10-CM

## 2016-01-09 DIAGNOSIS — I2510 Atherosclerotic heart disease of native coronary artery without angina pectoris: Secondary | ICD-10-CM

## 2016-01-09 DIAGNOSIS — Z7982 Long term (current) use of aspirin: Secondary | ICD-10-CM

## 2016-01-09 DIAGNOSIS — Z515 Encounter for palliative care: Secondary | ICD-10-CM

## 2016-01-09 DIAGNOSIS — J449 Chronic obstructive pulmonary disease, unspecified: Secondary | ICD-10-CM

## 2016-01-09 DIAGNOSIS — Z8781 Personal history of (healed) traumatic fracture: Secondary | ICD-10-CM

## 2016-01-09 DIAGNOSIS — R1310 Dysphagia, unspecified: Secondary | ICD-10-CM

## 2016-01-09 DIAGNOSIS — E119 Type 2 diabetes mellitus without complications: Secondary | ICD-10-CM

## 2016-01-09 DIAGNOSIS — F0390 Unspecified dementia without behavioral disturbance: Secondary | ICD-10-CM

## 2016-01-09 DIAGNOSIS — I129 Hypertensive chronic kidney disease with stage 1 through stage 4 chronic kidney disease, or unspecified chronic kidney disease: Secondary | ICD-10-CM

## 2016-01-09 DIAGNOSIS — D649 Anemia, unspecified: Secondary | ICD-10-CM

## 2016-01-09 DIAGNOSIS — F419 Anxiety disorder, unspecified: Secondary | ICD-10-CM

## 2016-01-09 DIAGNOSIS — N184 Chronic kidney disease, stage 4 (severe): Secondary | ICD-10-CM

## 2016-01-09 DIAGNOSIS — Z79899 Other long term (current) drug therapy: Secondary | ICD-10-CM

## 2016-01-09 DIAGNOSIS — I5023 Acute on chronic systolic (congestive) heart failure: Secondary | ICD-10-CM

## 2016-01-09 DIAGNOSIS — D72829 Elevated white blood cell count, unspecified: Secondary | ICD-10-CM

## 2016-01-09 DIAGNOSIS — I4891 Unspecified atrial fibrillation: Secondary | ICD-10-CM

## 2016-01-09 DIAGNOSIS — J189 Pneumonia, unspecified organism: Secondary | ICD-10-CM

## 2016-01-09 DIAGNOSIS — I739 Peripheral vascular disease, unspecified: Secondary | ICD-10-CM

## 2016-01-09 DIAGNOSIS — I252 Old myocardial infarction: Secondary | ICD-10-CM

## 2016-01-09 LAB — CBC
HEMATOCRIT: 26.9 % — AB (ref 35.0–47.0)
Hemoglobin: 8.4 g/dL — ABNORMAL LOW (ref 12.0–16.0)
MCH: 25.3 pg — ABNORMAL LOW (ref 26.0–34.0)
MCHC: 31.2 g/dL — AB (ref 32.0–36.0)
MCV: 81.2 fL (ref 80.0–100.0)
Platelets: 267 10*3/uL (ref 150–440)
RBC: 3.31 MIL/uL — ABNORMAL LOW (ref 3.80–5.20)
RDW: 19 % — AB (ref 11.5–14.5)
WBC: 15 10*3/uL — ABNORMAL HIGH (ref 3.6–11.0)

## 2016-01-09 LAB — GLUCOSE, CAPILLARY
GLUCOSE-CAPILLARY: 181 mg/dL — AB (ref 65–99)
GLUCOSE-CAPILLARY: 225 mg/dL — AB (ref 65–99)
Glucose-Capillary: 181 mg/dL — ABNORMAL HIGH (ref 65–99)
Glucose-Capillary: 187 mg/dL — ABNORMAL HIGH (ref 65–99)
Glucose-Capillary: 248 mg/dL — ABNORMAL HIGH (ref 65–99)
Glucose-Capillary: 271 mg/dL — ABNORMAL HIGH (ref 65–99)
Glucose-Capillary: 376 mg/dL — ABNORMAL HIGH (ref 65–99)

## 2016-01-09 LAB — BASIC METABOLIC PANEL
Anion gap: 13 (ref 5–15)
BUN: 96 mg/dL — AB (ref 6–20)
CALCIUM: 8.7 mg/dL — AB (ref 8.9–10.3)
CO2: 26 mmol/L (ref 22–32)
CREATININE: 2.77 mg/dL — AB (ref 0.44–1.00)
Chloride: 115 mmol/L — ABNORMAL HIGH (ref 101–111)
GFR calc non Af Amer: 14 mL/min — ABNORMAL LOW (ref >60)
GFR, EST AFRICAN AMERICAN: 17 mL/min — AB (ref >60)
Glucose, Bld: 211 mg/dL — ABNORMAL HIGH (ref 65–99)
Potassium: 3.8 mmol/L (ref 3.5–5.1)
Sodium: 154 mmol/L — ABNORMAL HIGH (ref 135–145)

## 2016-01-09 LAB — BRAIN NATRIURETIC PEPTIDE

## 2016-01-09 LAB — TROPONIN I: Troponin I: 3.38 ng/mL — ABNORMAL HIGH (ref <0.031)

## 2016-01-09 LAB — PROCALCITONIN: Procalcitonin: 7.46 ng/mL

## 2016-01-09 MED ORDER — ENSURE ENLIVE PO LIQD
237.0000 mL | Freq: Three times a day (TID) | ORAL | Status: DC
  Administered 2016-01-09 – 2016-01-12 (×6): 237 mL via ORAL

## 2016-01-09 MED ORDER — HEPARIN SODIUM (PORCINE) 5000 UNIT/ML IJ SOLN: 5000.0000 [IU] | Freq: Three times a day (TID) | INTRAMUSCULAR | Status: DC

## 2016-01-09 MED ORDER — DIAZEPAM 2 MG PO TABS: 2.0000 mg | ORAL_TABLET | Freq: Four times a day (QID) | ORAL | Status: DC | PRN

## 2016-01-09 MED ORDER — ENOXAPARIN SODIUM 30 MG/0.3ML ~~LOC~~ SOLN
30.0000 mg | SUBCUTANEOUS | Status: DC
  Administered 2016-01-09: 30 mg via SUBCUTANEOUS
  Filled 2016-01-09: qty 0.3

## 2016-01-09 MED ORDER — CETYLPYRIDINIUM CHLORIDE 0.05 % MT LIQD
7.0000 mL | Freq: Two times a day (BID) | OROMUCOSAL | Status: DC
  Administered 2016-01-09 – 2016-01-12 (×6): 7 mL via OROMUCOSAL

## 2016-01-09 MED ORDER — HEPARIN SODIUM (PORCINE) 5000 UNIT/ML IJ SOLN
5000.0000 [IU] | Freq: Two times a day (BID) | INTRAMUSCULAR | Status: DC
  Administered 2016-01-09 – 2016-01-12 (×6): 5000 [IU] via SUBCUTANEOUS
  Filled 2016-01-09 (×7): qty 1

## 2016-01-09 MED ORDER — METOPROLOL TARTRATE 25 MG PO TABS
12.5000 mg | ORAL_TABLET | Freq: Two times a day (BID) | ORAL | Status: DC
  Administered 2016-01-09 (×2): 12.5 mg via ORAL
  Filled 2016-01-09 (×2): qty 1

## 2016-01-09 MED ORDER — BUDESONIDE 0.25 MG/2ML IN SUSP
0.2500 mg | Freq: Two times a day (BID) | RESPIRATORY_TRACT | Status: DC
  Administered 2016-01-09 – 2016-01-12 (×6): 0.25 mg via RESPIRATORY_TRACT
  Filled 2016-01-09 (×6): qty 2

## 2016-01-09 MED ORDER — DEXTROSE 5 % IV SOLN
INTRAVENOUS | Status: DC
  Administered 2016-01-09: 10:00:00 via INTRAVENOUS

## 2016-01-09 MED ORDER — CHLORHEXIDINE GLUCONATE 0.12 % MT SOLN
15.0000 mL | Freq: Two times a day (BID) | OROMUCOSAL | Status: DC
  Administered 2016-01-09 – 2016-01-12 (×7): 15 mL via OROMUCOSAL
  Filled 2016-01-09 (×10): qty 15

## 2016-01-09 NOTE — Progress Notes (Signed)
Pt transferred back to ICU/SDU with tachyarrhythmias and increased dyspnea but never required BiPAP. Presently, very comfortable on [place] O2 @ 2 lpm. Voices no new complaints. Denies CP  Filed Vitals:   01/09/16 0750 01/09/16 0800 01/09/16 0900 01/09/16 1000  BP:  152/54 143/72 154/76  Pulse:  83 38 79  Temp: 96.9 F (36.1 C)     TempSrc: Axillary     Resp:  20 18 19   Height:      Weight:      SpO2:  94% 98% 98%   NAD Bibasliar crackles, no wheezes Reg, no M  NABS, soft No LE edema  BMP Latest Ref Rng 01/09/2016 01/07/2016 01/06/2016  Glucose 65 - 99 mg/dL 702(O) 378(H) 885(O)  BUN 6 - 20 mg/dL 27(X) 41(O) 87(O)  Creatinine 0.44 - 1.00 mg/dL 6.76(H) 2.09(O) 7.09(G)  Sodium 135 - 145 mmol/L 154(H) 143 139  Potassium 3.5 - 5.1 mmol/L 3.8 4.4 4.3  Chloride 101 - 111 mmol/L 115(H) 106 104  CO2 22 - 32 mmol/L 26 28 28   Calcium 8.9 - 10.3 mg/dL 2.8(Z) 8.9 6.6(Q)    CBC Latest Ref Rng 01/09/2016 01/07/2016 01/06/2016  WBC 3.6 - 11.0 K/uL 15.0(H) 15.2(H) 15.0(H)  Hemoglobin 12.0 - 16.0 g/dL 9.4(T) 8.3(L) 8.1(L)  Hematocrit 35.0 - 47.0 % 26.9(L) 26.2(L) 25.6(L)  Platelets 150 - 440 K/uL 267 243 249   PCT: 7.46 BNP > 4500  CXR 02/09: Persistent pulmonary vascular congestion. Airspace consolidation left base with small left effusion  IMPRESSION: Acute hypoxic respiratory failure - multifactorial Acute MI COPD Probable component of pulmonary edema - mild New LLL consolidation with mildly elevated PCT - probable PNA Hypernatremia  PLAN/REC: Monitor in SDU today - if not requiring BiPAP, would move to medical floor tomorrow Continue nebulized steroids, bronchodilators Cont current abx - narrow as able. Suggest 10 days total D5W ordered - DC once Na normalizes and pt taking sufficient POs  Billy Fischer, MD PCCM service Mobile [phone] Pager [phone]

## 2016-01-09 NOTE — Care Management (Signed)
Palliative care consult is pending.   At present, still do not see specific "comofrt care order." as was verbally reported to CM

## 2016-01-09 NOTE — Progress Notes (Signed)
ANTIBIOTIC CONSULT NOTE - INITIAL  Pharmacy Consult for vancomycin and Zosyn dosing Indication: pneumonia  No Known Allergies  Patient Measurements: Height: 5' (152.4 cm) Weight: 102 lb 11.8 oz (46.6 kg) IBW/kg (Calculated) : 45.5 Adjusted Body Weight: 48.9kg   Vital Signs: Temp: 96.9 F (36.1 C) (02/10 0750) Temp Source: Axillary (02/10 0750) BP: 152/62 mmHg (02/10 1500) Pulse Rate: 91 (02/10 1500) Intake/Output from previous day: 02/09 0701 - 02/10 0700 In: 203 [I.V.:3; IV Piggyback:200] Out: -  Intake/Output from this shift: Total I/O In: 256.7 [I.V.:256.7] Out: -   Labs:  Recent Labs  01/07/16 0426 01/09/16 0603  WBC 15.2* 15.0*  HGB 8.3* 8.4*  PLT 243 267  CREATININE 2.29* 2.77*   Estimated Creatinine Clearance: 10.1 mL/min (by C-G formula based on Cr of 2.77). No results for input(s): VANCOTROUGH, VANCOPEAK, VANCORANDOM, GENTTROUGH, GENTPEAK, GENTRANDOM, TOBRATROUGH, TOBRAPEAK, TOBRARND, AMIKACINPEAK, AMIKACINTROU, AMIKACIN in the last 72 hours.   Microbiology: Recent Results (from the past 720 hour(s))  MRSA PCR Screening     Status: Abnormal   Collection Time: 01/07/16 12:45 AM  Result Value Ref Range Status   MRSA by PCR POSITIVE (A) NEGATIVE Final    Comment: CRITICAL RESULT CALLED TO, READ BACK BY AND VERIFIED WITH: ZACHERY ALLEN AT 0437 ON 01/07/16 BY VAB        The GeneXpert MRSA Assay (FDA approved for NASAL specimens only), is one component of a comprehensive MRSA colonization surveillance program. It is not intended to diagnose MRSA infection nor to guide or monitor treatment for MRSA infections.     Medical History: Past Medical History  Diagnosis Date  . Muscle weakness   . Non-ST elevated myocardial infarction [facility])     a. likely demand ischemia 02/2015 in the setting of ARF and acute on chronic CHF  . Humerus fracture     a. left, b. 02/2015; c. in the setting of increased weakness  . Dysphagia   . Anemia   . Paroxysmal a-fib  (HCC)     a. reported a-fib, no documented EKGs showing this  . Chronic systolic CHF (congestive heart failure) (HCC)     a. echo 02/2015: EF 40-45%, mod dilated LA, mildly dilated RA, mod MR/TR, mildly elevated PASP 43 mm Hg, no evidence of intracardiac shunting, intact interatrial and interventricular septa  . COPD (chronic obstructive pulmonary disease) (HCC)     a. on home O2  . Hypertensive chronic kidney disease     a. Stage 4  . Diabetes mellitus without complication (HCC)   . PVD (peripheral vascular disease) (HCC)   . Dementia   . Anxiety disorder     Medications:   Assessment: 82 y/o F with respiratory failure due to CHF/COPD exacerbations as well as PNA on empiric abx.   Goal of Therapy:  Vancomycin trough level 15-20 mcg/ml  Plan:  Continue vancomycin 500 mg iv q 48 hours. Level before 4th dose, not at Css.  Continue Zosyn 3.375 grams q 12 hours ordered.  Laura Jefferson D 01/09/2016,3:31 PM

## 2016-01-09 NOTE — Progress Notes (Signed)
EKG completed

## 2016-01-09 NOTE — Progress Notes (Signed)
[facility] - [place] at [facility]   PATIENT NAME: Laura Jefferson    MR#:  [id number]  DATE OF BIRTH:  [birth year]  SUBJECTIVE:  CHIEF COMPLAINT:   Chief Complaint  Patient presents with  . Respiratory Distress   - Moved to ICU yesterday as tachypneic yesterday. After diuresis breathing improved some and now on 2L nasal cannula - Didn't need the Bipap so far - Daughter at bedside. Hypernatremic this morning- started on D5W  REVIEW OF SYSTEMS:  Review of Systems  Unable to perform ROS: critical illness    DRUG ALLERGIES:  No Known Allergies  VITALS:  Blood pressure 152/62, pulse 91, temperature 96.9 F (36.1 C), temperature source Axillary, resp. rate 23, height 5' (1.524 m), weight 46.6 kg (102 lb 11.8 oz), SpO2 96 %.  PHYSICAL EXAMINATION:  Physical Exam  GENERAL:  82 y.o.-year-old patient lying in the bed not in any acute distress.  EYES: Pupils equal, round, reactive to light and accommodation. No scleral icterus. Extraocular muscles intact.  HEENT: Head atraumatic, normocephalic. Oropharynx and nasopharynx clear.  NECK:  Supple, no jugular venous distention. No thyroid enlargement, no tenderness.  LUNGS: scattered wheezing with occasional crackles at the bases. No rhonchi, gurgling secretions noted . Not using accessory muscles to breathe. CARDIOVASCULAR: S1, S2 normal. No rubs, or gallops. 2/6 systolic murmur present,. ABDOMEN: Soft, nontender, nondistended. Bowel sounds present. No organomegaly or mass.  EXTREMITIES: No pedal edema, cyanosis, or clubbing.  NEUROLOGIC: Alert and following commands, able to move all extremities in bed PSYCHIATRIC: The patient is alert.  SKIN: No obvious rash, lesion, or ulcer.    LABORATORY PANEL:   CBC  Recent Labs Lab 01/09/16 0603  WBC 15.0*  HGB 8.4*  HCT 26.9*  PLT 267    ------------------------------------------------------------------------------------------------------------------  Chemistries   Recent Labs Lab 01/09/16 0603  NA 154*  K 3.8  CL 115*  CO2 26  GLUCOSE 211*  BUN 96*  CREATININE 2.77*  CALCIUM 8.7*   ------------------------------------------------------------------------------------------------------------------  Cardiac Enzymes  Recent Labs Lab 01/09/16 0603  TROPONINI 3.38*   ------------------------------------------------------------------------------------------------------------------  RADIOLOGY:  Dg Chest Port 1 View  01/09/2016  CLINICAL DATA:  CHF, productive cough, wheezing, COPD, hypertension, diabetes mellitus, dementia EXAM: PORTABLE CHEST 1 VIEW COMPARISON:  Portable exam 1228 hours compared to 01/08/2016 at 1508 hours. FINDINGS: Enlargement of cardiac silhouette with pulmonary vascular congestion. Aspect calcification aorta. Perihilar infiltrates compatible pulmonary edema and CHF little changed. More focal opacity in the retrocardiac LEFT lower lobe could represent asymmetric edema or developing consolidation. No gross pleural effusion or pneumothorax. Bones demineralized with old posttraumatic deformity of proximal RIGHT humerus. IMPRESSION: Enlargement of cardiac silhouette with pulmonary vascular congestion and probable pulmonary edema, little changed. Persistent LEFT lower lobe opacity question asymmetric edema versus pneumonia. Electronically Signed   By: Ulyses Southward M.D.   On: 01/09/2016 12:47   Dg Chest Port 1 View  01/08/2016  CLINICAL DATA:  Respiratory failure with increased shortness of breath EXAM: PORTABLE CHEST 1 VIEW COMPARISON:  January 07, 2016 FINDINGS: There is persistent cardiomegaly with mild pulmonary venous hypertension. There is airspace consolidation in the left base with small left effusion. There is atelectatic change in the right base. There is extensive atherosclerotic change in the  aorta. There is evidence of old trauma involving the proximal right humeral metaphysis. Bones are osteoporotic. IMPRESSION: Persistent pulmonary vascular congestion. Airspace consolidation left base with small left effusion. Suspect pneumonia superimposed on a degree of congestive heart failure. There is atelectatic  change in the right base. Electronically Signed   By: Bretta Bang III M.D.   On: 01/08/2016 15:31    EKG:   Orders placed or performed during the hospital encounter of 01/01/16  . EKG 12-Lead  . EKG 12-Lead  . EKG 12-Lead  . EKG 12-Lead  . EKG 12-Lead  . EKG 12-Lead  . EKG 12-Lead  . EKG 12-Lead  . EKG 12-Lead  . EKG 12-Lead    ASSESSMENT AND PLAN:   82 year old female with history of systolic congestive heart failure, COPD chronic respiratory failure on 1 L nasal cannula who presented with shortness of breath respiratory failure on2/2/17.  1. Acute on chronic respiratory failure with hypoxia:  - Secondary to acute on chronic systolic congestive heart failure as well as COPD exacerbation, - bilateral pneumonia:  - ABG with no elevated co2 - Continue supplemental oxygen, DuoNeb treatments, steroids - on  IV, Zosyn, and vancomycin,  blood cultures negative. - lasix on hold  2. Non-Q-wave MI, appreciate cardiology's input, Continue patient on metoprolol ,aspirin ,Nitroglycerin topically - off Plavix and Heparin due to anemia . Restarted plavix - appreciate cardiology consult. - Echo is normal, per cardiologist. F/u report - significantly elevated troponin  3. Acute kidney injury on chronic kidney disease:   creatinine was 1.9-2.0 in August 2016 , - worsened renal function due to diuresis  3. Type 2 diabetes with hyperglycemia, steroid-induced hyperglycemia, improved while steroids were decreased   Hold lantus as patient is NPO pending swallow eval. Continue sliding scale coverage   4. Essential hypertension: Lopressor, blood pressure is well  controlled  5. Anxiety -PRN  Valium  6. Venous thromboembolic prophylactic: Heparin subcutaneous  7. Anemia,- likely anemia of chronic disease - hb dropped with loading dose of plavix and heparin drip for NSTEMI - stable at 8.0 now, restarted plavix and also aspirin - monitor for now- transfuse as needed if hb<8.0 due to acute MI  Physical therapy consult once medically stable. Palliative care consulted  Lasix cannot be given due to worsening renal function, however doubt that pulmonary congestion might be returning and patient could be just having back and forth respiratory distress. For now BiPAP when necessary. -Discussed with daughter that long-term plan needs to be discussed. Palliative care consulted for the same.   All the records are reviewed and case discussed with Care Management/Social Workerr. Management plans discussed with the patient, family and they are in agreement.  CODE STATUS: DNR  TOTAL CRITICAL CARE TIME SPENT IN TAKING CARE OF THIS PATIENT: 42 minutes.     Damyen Knoll M.D on 01/09/2016 at 3:25 PM  Between 7am to 6pm - Pager - [phone]  After 6pm go to [email or web address] - password [email or web address] [facility]  [facility]  Office  [phone]  CC: Primary care physician; Jerl Mina, MD

## 2016-01-09 NOTE — Evaluation (Signed)
Clinical/Bedside Swallow Evaluation Patient Details  Name: Laura Jefferson MRN: [id number] Date of Birth: 07-11-1925  Today's Date: 01/09/2016 Time: SLP Start Time (ACUTE ONLY): 1320 SLP Stop Time (ACUTE ONLY): 1420 SLP Time Calculation (min) (ACUTE ONLY): 60 min  Past Medical History:  Past Medical History  Diagnosis Date  . Muscle weakness   . Non-ST elevated myocardial infarction [facility])     a. likely demand ischemia 02/2015 in the setting of ARF and acute on chronic CHF  . Humerus fracture     a. left, b. 02/2015; c. in the setting of increased weakness  . Dysphagia   . Anemia   . Paroxysmal a-fib (HCC)     a. reported a-fib, no documented EKGs showing this  . Chronic systolic CHF (congestive heart failure) (HCC)     a. echo 02/2015: EF 40-45%, mod dilated LA, mildly dilated RA, mod MR/TR, mildly elevated PASP 43 mm Hg, no evidence of intracardiac shunting, intact interatrial and interventricular septa  . COPD (chronic obstructive pulmonary disease) (HCC)     a. on home O2  . Hypertensive chronic kidney disease     a. Stage 4  . Diabetes mellitus without complication (HCC)   . PVD (peripheral vascular disease) (HCC)   . Dementia   . Anxiety disorder    Past Surgical History:  Past Surgical History  Procedure Laterality Date  . Arm surgery    . Peripheral arterial stent graft    . Esophagogastroduodenoscopy (egd) with propofol N/A 11/06/2015    Procedure: ESOPHAGOGASTRODUODENOSCOPY (EGD) WITH PROPOFOL;  Surgeon: Elnita Maxwell, MD;  Location: [facility] ENDOSCOPY;  Service: Endoscopy;  Laterality: N/A;   HPI:  Pt is a 82 y.o. female w/ h/o Dementia, CHF, COPD, anxiety, dysphagia, muscle weakness who presents with acute respiratory failure. Patient is on BiPAP, and is unable to provide much of her own history today. Her daughter is at bedside with her in the ED and provides much of the history. Her daughter is her caregiver at home. She states that for the past week or so she's been  having progressive symptoms, slowly increasing shortness of breath and fatigue. As well as some increasing orthopnea. She went to her outpatient physician and was given a Z-Pak, which she has taken about half, with the thought that this might be due to her COPD. However, tonight she had acutely worse and they brought her to the ED for evaluation. She was hypoxic on arrival, chest x-rays consistent with basilar congestion and CHF. Patient does have an elevated white blood cell count at 19, though she denies any infectious symptoms. Chest x-ray did not show any pneumonia. Patient required BiPAP in order to improve her O2 sats. Pt had a recent decline in status requiring transfer to CCU but has stabilized and more awake. She has been using BiPAP for respiratory support; has been NPO since first of the week.    Assessment / Plan / Recommendation Clinical Impression  Pt appeared to tolerate trials of thin liquids and purees/soft solids monitored strictly w/ no immediate s/s of aspiration noted; no decline in O2 sats (98%) but noted min. increased respiratory effort w/ trials similar to her baseline. Given rest breaks, her breathing calmed to its baseline. During the oral phase, pt was able to even masticate soft solids moistened well but appeared to fatigue easily from the exertion - this could increase risk for aspiration. Pt required max. feeding support and rest breaks during eating/drinking. Pt appears at min.+ increased risk for  aspiration at this time and would benefit from a modified diet consistency w/ 100% feeding support; aspiration precautions; meds in Puree - crushed as able(family reported pt was already holding pills orally intermittently at home). Pt does have Dementia baseline which can increase risk for oropharyngeal phase dysphagia.    Aspiration Risk  Mild aspiration risk (-moderate risk)    Diet Recommendation  Dys. 1 w/ thin liquids; aspiration precautions; feeding assistance to include rest  breaks during meals to avoid SOB/WOB.   Medication Administration: Crushed with puree    Other  Recommendations Recommended Consults:  (Dietician) Oral Care Recommendations: Oral care BID;Staff/trained caregiver to provide oral care   Follow up Recommendations  Skilled Nursing facility (TBD)    Frequency and Duration min 3x week  2 weeks       Prognosis Prognosis for Safe Diet Advancement: Fair Barriers to Reach Goals: Cognitive deficits;Severity of deficits      Swallow Study   General Date of Onset: 01/01/16 HPI: Pt is a 82 y.o. female w/ h/o Dementia, CHF, COPD, anxiety, dysphagia, muscle weakness who presents with acute respiratory failure. Patient is on BiPAP, and is unable to provide much of her own history today. Her daughter is at bedside with her in the ED and provides much of the history. Her daughter is her caregiver at home. She states that for the past week or so she's been having progressive symptoms, slowly increasing shortness of breath and fatigue. As well as some increasing orthopnea. She went to her outpatient physician and was given a Z-Pak, which she has taken about half, with the thought that this might be due to her COPD. However, tonight she had acutely worse and they brought her to the ED for evaluation. She was hypoxic on arrival, chest x-rays consistent with basilar congestion and CHF. Patient does have an elevated white blood cell count at 19, though she denies any infectious symptoms. Chest x-ray did not show any pneumonia. Patient required BiPAP in order to improve her O2 sats. Pt had a recent decline in status requiring transfer to CCU but has stabilized and more awake. She has been using BiPAP for respiratory support; has been NPO since first of the week.  Type of Study: Bedside Swallow Evaluation Previous Swallow Assessment: none indicated this admission Diet Prior to this Study: NPO Temperature Spikes Noted:  (temps 100.3-96.9; wbc 15.0) Respiratory Status:  Nasal cannula (2 liters) History of Recent Intubation: No (BiPAP) Behavior/Cognition: Cooperative;Pleasant mood;Alert;Confused;Requires cueing;Distractible (min. verbal but followed few basic commands) Oral Cavity Assessment: Dry (difficult to assess ) Oral Care Completed by SLP: Recent completion by staff Oral Cavity - Dentition: Adequate natural dentition Vision:  (n/a) Self-Feeding Abilities: Total assist Patient Positioning: Upright in bed Baseline Vocal Quality: Low vocal intensity Volitional Cough: Congested Volitional Swallow: Unable to elicit    Oral/Motor/Sensory Function Overall Oral Motor/Sensory Function: Within functional limits   Ice Chips Ice chips: Within functional limits Presentation: Spoon (fed; 5 trials)   Thin Liquid Thin Liquid: Impaired Presentation: Cup (assisted heavily w/ feeding; 8 trials) Oral Phase Impairments: Poor awareness of bolus (lips to cup; distracted) Oral Phase Functional Implications:  (none) Pharyngeal  Phase Impairments:  (no coughing; min. increased respiratory effort w/ trials)    Nectar Thick Nectar Thick Liquid: Not tested   Honey Thick Honey Thick Liquid: Not tested   Puree Puree: Within functional limits Presentation: Spoon (fed; 6 trials) Other Comments: min. increased respiratory effort w/ trials   Solid   GO   Solid:  Within functional limits (soft solids moistened) Presentation:  (fed; 4 trials) Other Comments: min. increased respiratory effort w/ trials       Jerilynn Som, MS, CCC-SLP  Watson,Katherine 01/09/2016,2:37 PM

## 2016-01-09 NOTE — Progress Notes (Signed)
Initial Nutrition Assessment    INTERVENTION:   Meals and Snacks: Cater to patient preferences; discussed with pt's family Medical Food Supplement Therapy: family would like for pt to receive nutritional supplements; pt likes Ensure; will send between meals. Pt also likes ice cream, family interested in pt trying Magic Cup, will send BID  NUTRITION DIAGNOSIS:   None at this time  GOAL:   Patient will meet greater than or equal to 90% of their needs   MONITOR:    (Energy Intake, Pulmonary, Glucose Profile, Electrolyte/Renal Profile)  REASON FOR ASSESSMENT:   Consult Assessment of nutrition requirement/status  ASSESSMENT:    Dietitian re-consulted for Assessment. Screened pt on 01/07/16 for LOS and signed off on patient as poc at that time was to focus on comfort. Pt remains DNR, not currently comfort care but per Sarah RN the primary goal continues for pt to be comfortable. Pt admitted with acute respiratory failure with acute on chronic CHF, COPD exacerbation and bilateral pneumonia, Bipap prn, NSTEMI, acute on CKD  Past Medical History  Diagnosis Date  . Muscle weakness   . Non-ST elevated myocardial infarction [facility])     a. likely demand ischemia 02/2015 in the setting of ARF and acute on chronic CHF  . Humerus fracture     a. left, b. 02/2015; c. in the setting of increased weakness  . Dysphagia   . Anemia   . Paroxysmal a-fib (HCC)     a. reported a-fib, no documented EKGs showing this  . Chronic systolic CHF (congestive heart failure) (HCC)     a. echo 02/2015: EF 40-45%, mod dilated LA, mildly dilated RA, mod MR/TR, mildly elevated PASP 43 mm Hg, no evidence of intracardiac shunting, intact interatrial and interventricular septa  . COPD (chronic obstructive pulmonary disease) (HCC)     a. on home O2  . Hypertensive chronic kidney disease     a. Stage 4  . Diabetes mellitus without complication (HCC)   . PVD (peripheral vascular disease) (HCC)   . Dementia   .  Anxiety disorder      Diet Order:  DIET - DYS 1 Room service appropriate?: Yes with Assist; Fluid consistency:: Thin   Energy Intake: diet advanced s/p SLP evaluation today; no po intake except bites and sips thus far today; recorded po intake 81% of meals on average between 2/4-01/06/16  Glucose Profile:   Recent Labs  01/09/16 0401 01/09/16 0750 01/09/16 1140  GLUCAP 181* 181* 225*    Recent Labs Lab 01/06/16 0544 01/07/16 0426 01/09/16 0603  NA 139 143 154*  K 4.3 4.4 3.8  CL 104 106 115*  CO2 28 28 26   BUN 79* 81* 96*  CREATININE 2.21* 2.29* 2.77*  CALCIUM 8.8* 8.9 8.7*  GLUCOSE 233* 201* 211*    Meds: ss novolog, D5 at 50 ml/hr  Height:   Ht Readings from Last 1 Encounters:  01/01/16 5' (1.524 m)    Weight:   Wt Readings from Last 1 Encounters:  01/09/16 102 lb 11.8 oz (46.6 kg)   BMI:  Body mass index is 20.06 kg/(m^2).  LOW Care Level due to [facility] MS, RD, LDN [phone] Pager  [phone] Weekend/On-Call Pager

## 2016-01-09 NOTE — Progress Notes (Signed)
Notified Dr. Kirke Corin of critical troponin value of 3.38 (down from 11.08 and higher values, medically managed NSTEMI/STEMI). No new orders at this time.

## 2016-01-09 NOTE — Progress Notes (Signed)
Patient ordered enoxaparin 30mg  SQ Q24hr.   Patient's CrCl is 8mL/min. Per protocol, will transition patient to heparin 5000 units SQ Q 12hr.   Pharmacy will continue to monitor and adjust per protocol.

## 2016-01-09 NOTE — Progress Notes (Signed)
Spoke with Intensivist/Pulmonolgist, Dr. Belia Heman, about patient's change in condition. Patient rapidly dropped O2 sats into low 80s (see vital sign flowsheets) after spending morning comfortable and with O2 sats in mid to high 90s on 1L nasal cannula. Patient did not appear to be working to breathe but pulse ox in good position and had an excellent waveform. RN titrated oxygen up with nasal cannula, then venturi mask, and then non-rebreather to finally get O2 sat 92 and above. Throughout all patient resting comfortable, did answer pain question and denied pain. Lungs more diminished, especially on left where RN heard only very faint sounds despite symmetrical chest movement. No new orders from MD, plan to titrate O2 back down as tolerated. Patient had had oral intake earlier with ST and then RN supervision, no signs of distress, no coughing, no dropping of O2 sats and no oral intake or apparent change in condition right before this event. RT aware of plan and condition as well. Family at bedside and updated as events occurred.

## 2016-01-09 NOTE — Consult Note (Signed)
Palliative Medicine Inpatient Consult Note   Name: Laura Jefferson Date: 01/09/2016 MRN: [id number]  DOB: [birth year]  Referring Physician: Gladstone Lighter, MD  Palliative Care consult requested for this 82 y.o. female for goals of medical therapy in patient with acute on chronic respiratory failure.    TODAY'S DISCUSSIONS AND DECISIONS: I met with pt's son, daughter, and other relatives. We talked for over an hour this evening.  I was able to listen to their stories and also to hear grandson's concerns about 'doping her up on morphine when she was sitting there looking at the tv doctor'.  Concerns and questions about 'comfort care', hospice care, terminal care, and all kinds of care were addressed.   The family seems inclined to want to pursue comfort care --but they now have a better understanding that this does not mean 'pulling the plug' as she is not actually on a machine that has a plug to be pulled.  We can customize what comfort care might look like, but we have to have time to discuss the details in order to do so.  Family agrees to continue current care as long as she is stable to improving--until Monday. This is b/c several more relatives are coming in town Sunday evening.  I will plan on meeting with them again on Monday.  They now understand that morphine is not used to 'push her off the cliff' but rather to address air hunger.  They also know she is going to have a 'roller coaster' kind of a course where she has good days and bad days but its like a roller coaster going from [place] to [facility] --the overall direction is [place] but it is up and down on the way there.    IF she 'crashes' in the interim, they would most likely be ready to allow her to pass away without further aggressive interventions, but that would (of course) have to be addressed at that time.  They are hopeful that she will be stable to improved over the next couple of days --so we can talk about specifics of a  more palliative approach to her care starting Monday.     BRIEF HISTORY: Pt was admitted on 01/02/16 with shortness of breath. She was placed on BIPAP.  Daughter is caregiver at home.  Pt had symptoms of shortness of breath for about a week at home.  She had been treated with Zpak for one week at home but symptoms were thought to be due to COPD exacerbation.  On arrival, she was hypoxic with bibasilar congestion and CHF diagnosed. She had leukocytosis with WBC of 19.  CXR did not show over pneumonia.  BIPAP was needed.  She uses Ox at night at home usually.  She was diuresed but renal function worsened.  She had a cardiology consult and assessment was that this was more pulmonary than cardiology problem. On 2/5, her troponin was elevated at 42. 45.  She had ST -T changes on EKG. She was started on a heparin drip. An echo showed a nl EF with mild to mod MR and mildly dilated left atrium and mod to sev elevated PA pressure of 62.  Cath was not recommended.  Plavix was DCd due to worsening anemia.  Home Health Pt was rec but family declined this offer.  No active bleeding.  She later had some emesis.  She was started on ABX for probable pneumonia.  She has gone back and forth from ICU to floor to ICU  and she is again improved and off of BIPAP.  Confused but alert and smiling.    IMPRESSION: COPD exacerbation and chronic COPD Acute on chronic Resp flre with hypoxia due to acute on chronic systolic CHF and COPD exac and possible bilat pneumonia  NSTEMI CAD with h/o NSTEMI Dysphagia Anemia Paroxysmal Afib Chronic Systolic and diastolic CHF with EF of 85-88%, mod MR and TR 4/16 echo AFib RVR 150-160 HTN  CKD PVD Dementia Anxiety  Former Smoker --39 yrs smoking--quit 10 mos ago DM2 Dysphagia (diet d-1 with thins)   REVIEW OF SYSTEMS:  Patient is not able to provide ROS due to lethargy and critical illness.   SPIRITUAL SUPPORT SYSTEM: Yes.  SOCIAL HISTORY:  reports that she quit smoking about  10 months ago. Her smoking use included Cigarettes. She quit after 70 years of use. She does not have any smokeless tobacco history on file. She reports that she does not drink alcohol or use illicit drugs.  She has an adult son and adult daughter who are involved in her life and her care.  Former Production manager pt.    LEGAL DOCUMENTS:  None  CODE STATUS: DNR  PAST MEDICAL HISTORY: Past Medical History  Diagnosis Date  . Muscle weakness   . Non-ST elevated myocardial infarction [facility])     a. likely demand ischemia 02/2015 in the setting of ARF and acute on chronic CHF  . Humerus fracture     a. left, b. 02/2015; c. in the setting of increased weakness  . Dysphagia   . Anemia   . Paroxysmal a-fib (HCC)     a. reported a-fib, no documented EKGs showing this  . Chronic systolic CHF (congestive heart failure) ([place])     a. echo 02/2015: EF 40-45%, mod dilated LA, mildly dilated RA, mod MR/TR, mildly elevated PASP 43 mm Hg, no evidence of intracardiac shunting, intact interatrial and interventricular septa  . COPD (chronic obstructive pulmonary disease) ([place])     a. on home O2  . Hypertensive chronic kidney disease     a. Stage 4  . Diabetes mellitus without complication ([place])   . PVD (peripheral vascular disease) ([place])   . Dementia   . Anxiety disorder     PAST SURGICAL HISTORY:  Past Surgical History  Procedure Laterality Date  . Arm surgery    . Peripheral arterial stent graft    . Esophagogastroduodenoscopy (egd) with propofol N/A 11/06/2015    Procedure: ESOPHAGOGASTRODUODENOSCOPY (EGD) WITH PROPOFOL;  Surgeon: Josefine Class, MD;  Location: [facility];  Service: Endoscopy;  Laterality: N/A;    ALLERGIES:  has No Known Allergies.  MEDICATIONS:  Current Facility-Administered Medications  Medication Dose Route Frequency Provider Last Rate Last Dose  . acetaminophen (TYLENOL) tablet 650 mg  650 mg Oral Q6H PRN Lance Coon, MD      . albuterol (PROVENTIL) (2.5 MG/3ML) 0.083%  nebulizer solution 2.5 mg  2.5 mg Nebulization Q3H PRN Wilhelmina Mcardle, MD      . antiseptic oral rinse (CPC / CETYLPYRIDINIUM CHLORIDE 0.05%) solution 7 mL  7 mL Mouth Rinse q12n4p Gladstone Lighter, MD   7 mL at 01/09/16 1600  . aspirin chewable tablet 81 mg  81 mg Oral Daily Gladstone Lighter, MD   81 mg at 01/09/16 1428  . atorvastatin (LIPITOR) tablet 40 mg  40 mg Oral q1800 Theodoro Grist, MD   40 mg at 01/09/16 1816  . budesonide (PULMICORT) nebulizer solution 0.25 mg  0.25 mg Nebulization BID Shanon Brow  Achille Rich, MD      . chlorhexidine (PERIDEX) 0.12 % solution 15 mL  15 mL Mouth Rinse BID Gladstone Lighter, MD   15 mL at 01/09/16 1315  . Chlorhexidine Gluconate Cloth 2 % PADS 6 each  6 each Topical Q0600 Theodoro Grist, MD   6 each at 01/09/16 0600  . clopidogrel (PLAVIX) tablet 75 mg  75 mg Oral Daily Gladstone Lighter, MD   75 mg at 01/09/16 1429  . dextrose 5 % solution   Intravenous Continuous Wilhelmina Mcardle, MD 50 mL/hr at 01/09/16 [phone]    . diazepam (VALIUM) tablet 2 mg  2 mg Oral Q6H PRN Wilhelmina Mcardle, MD      . docusate sodium (COLACE) capsule 100 mg  100 mg Oral BID Theodoro Grist, MD   Stopped at 01/08/16 1000  . feeding supplement (ENSURE ENLIVE) (ENSURE ENLIVE) liquid 237 mL  237 mL Oral TID BM Gladstone Lighter, MD   237 mL at 01/09/16 1500  . heparin injection 5,000 Units  5,000 Units Subcutaneous Q12H [facility] N[facility], [facility]      . insulin aspart (novoLOG) injection 0-9 Units  0-9 Units Subcutaneous 6 times per day Wilhelmina Mcardle, MD   3 Units at 01/09/16 2006  . ipratropium-albuterol (DUONEB) 0.5-2.5 (3) MG/3ML nebulizer solution 3 mL  3 mL Nebulization Q6H Wilhelmina Mcardle, MD   3 mL at 01/09/16 1438  . metoprolol tartrate (LOPRESSOR) tablet 12.5 mg  12.5 mg Oral BID Wilhelmina Mcardle, MD   12.5 mg at 01/09/16 1428  . morphine 2 MG/ML injection 1-2 mg  1-2 mg Intravenous Q1H PRN Wilhelmina Mcardle, MD   2 mg at 01/08/16 1954  . mupirocin ointment (BACTROBAN) 2 % 1 application  1  application Nasal BID Theodoro Grist, MD   1 application at 62/[birth year]/33 [phone]  . nitroGLYCERIN (NITROGLYN) 2 % ointment 1 inch  1 inch Topical 3 times per day Theodoro Grist, MD   1 inch at 01/09/16 1400  . ondansetron (ZOFRAN) tablet 4 mg  4 mg Oral Q6H PRN Lance Coon, MD       Or  . ondansetron [facility]) injection 4 mg  4 mg Intravenous Q6H PRN Lance Coon, MD      . piperacillin-tazobactam (ZOSYN) IVPB 3.375 g  3.375 g Intravenous Q12H Lance Coon, MD   3.375 g at 01/09/16 1636  . senna (SENOKOT) tablet 8.6 mg  1 tablet Oral Daily Theodoro Grist, MD   8.6 mg at 01/09/16 1427  . sodium chloride flush (NS) 0.9 % injection 3 mL  3 mL Intravenous Q12H Lance Coon, MD   3 mL at 01/08/16 2204  . vancomycin (VANCOCIN) 500 mg in sodium chloride 0.9 % 100 mL IVPB  500 mg Intravenous Q48H Lance Coon, MD   500 mg at 01/08/16 0651    Vital Signs: BP 149/54 mmHg  Pulse 80  Temp(Src) 98.9 F (37.2 C) (Axillary)  Resp 15  Ht 5' (1.524 m)  Wt 46.6 kg (102 lb 11.8 oz)  BMI 20.06 kg/m2  SpO2 99% Filed Weights   01/06/16 0445 01/08/16 0449 01/09/16 0500  Weight: 48.943 kg (107 lb 14.4 oz) 46.7 kg (102 lb 15.3 oz) 46.6 kg (102 lb 11.8 oz)    Estimated body mass index is 20.06 kg/(m^2) as calculated from the following:   Height as of this encounter: 5' (1.524 m).   Weight as of this encounter: 46.6 kg (102 lb 11.8 oz).  PERFORMANCE STATUS (ECOG) :  4 - Bedbound  PHYSICAL EXAM: Lethargic Eyes closed Some temporal wasting noted No JVD or Tm Hrt rrr with irreg beats --frequent Bigeminy on tele Lungs cta ant  Abd soft and NT Ext no mottling or cyanosis Skin warm and dr--red under breasts. Has some echymoses.   LABS: CBC:    Component Value Date/Time   WBC 15.0* 01/09/2016 0603   WBC 10.2 03/11/2015 0749   HGB 8.4* 01/09/2016 0603   HGB 8.2* 03/11/2015 0749   HCT 26.9* 01/09/2016 0603   HCT 26.4* 03/11/2015 0749   PLT 267 01/09/2016 0603   PLT 244 03/11/2015 0749   MCV 81.2 01/09/2016  0603   MCV 93 03/11/2015 0749   NEUTROABS 14.2* 01/01/2016 2247   NEUTROABS 7.4* 03/11/2015 0749   LYMPHSABS 2.7 01/01/2016 2247   LYMPHSABS 1.6 03/11/2015 0749   MONOABS 1.4* 01/01/2016 2247   MONOABS 0.8 03/11/2015 0749   EOSABS 0.8* 01/01/2016 2247   EOSABS 0.3 03/11/2015 0749   BASOSABS 0.2* 01/01/2016 2247   BASOSABS 0.0 03/11/2015 0749   Comprehensive Metabolic Panel:    Component Value Date/Time   NA 154* 01/09/2016 0603   NA CANCELED 04/29/2015 1612   NA 143 03/11/2015 0749   K 3.8 01/09/2016 0603   K 3.9 03/11/2015 0749   CL 115* 01/09/2016 0603   CL 109 03/11/2015 0749   CO2 26 01/09/2016 0603   CO2 27 03/11/2015 0749   BUN 96* 01/09/2016 0603   BUN CANCELED 04/29/2015 1612   BUN 47* 03/11/2015 0749   CREATININE 2.77* 01/09/2016 0603   CREATININE 1.48* 03/11/2015 0749   GLUCOSE 211* 01/09/2016 0603   GLUCOSE CANCELED 04/29/2015 1612   GLUCOSE 209* 03/11/2015 0749   CALCIUM 8.7* 01/09/2016 0603   CALCIUM 8.0* 03/11/2015 0749   AST 22 07/22/2015 2116   AST 18 03/07/2015 1730   ALT 13* 07/22/2015 2116   ALT 12* 03/07/2015 1730   ALKPHOS 60 07/22/2015 2116   ALKPHOS 68 03/07/2015 1730   BILITOT 0.2* 07/22/2015 2116   BILITOT 0.4 03/07/2015 1730   PROT 7.4 07/22/2015 2116   PROT 6.1* 03/07/2015 1730   ALBUMIN 3.5 07/22/2015 2116   ALBUMIN 2.5* 03/07/2015 1730     More than 50% of the visit was spent in counseling/coordination of care: Yes  Time Spent: 80 minutes

## 2016-01-09 NOTE — Progress Notes (Signed)
Inpatient Diabetes Program Recommendations  AACE/ADA: New Consensus Statement on Inpatient Glycemic Control (2015)  Target Ranges:  Prepandial:   less than 140 mg/dL      Peak postprandial:   less than 180 mg/dL (1-2 hours)      Critically ill patients:  140 - 180 mg/dL   Review of Glycemic Control  Results for Laura Jefferson, Laura Jefferson (MRN [id number]) as of 01/09/2016 11:21  Ref. Range 01/08/2016 11:57 01/08/2016 21:48 01/09/2016 00:06 01/09/2016 04:01 01/09/2016 07:50  Glucose-Capillary Latest Ref Range: 65-99 mg/dL 564 (H) 332 (H) 951 (H) 181 (H) 181 (H)    Diabetes history: DM2 Outpatient Diabetes medications: Glipizide XL 10 mg daily, Metformin 1000 mg QAM, Metformin 500 mg QPM Current orders for Inpatient glycemic control: Novolog 0-9 units q4h  Inpatient Diabetes Program Recommendations: Steroids now discontinued.  Because of poor renal function, may want to consider decreasing Novolog correction to q6h.    If blood sugars elevate, consider Lantus 5 units qday as a safer alternative than increasing Novolog correction insulin above the 0-9units.  Susette Racer, RN, BA, MHA, CDE Diabetes Coordinator Inpatient Diabetes Program  [phone] (Team Pager) [phone] [facility] Office) 01/09/2016 11:25 AM

## 2016-01-10 DIAGNOSIS — L8990 Pressure ulcer of unspecified site, unspecified stage: Secondary | ICD-10-CM | POA: Insufficient documentation

## 2016-01-10 LAB — BASIC METABOLIC PANEL
Anion gap: 13 (ref 5–15)
BUN: 98 mg/dL — AB (ref 6–20)
CALCIUM: 8.4 mg/dL — AB (ref 8.9–10.3)
CO2: 28 mmol/L (ref 22–32)
CREATININE: 2.46 mg/dL — AB (ref 0.44–1.00)
Chloride: 114 mmol/L — ABNORMAL HIGH (ref 101–111)
GFR calc Af Amer: 19 mL/min — ABNORMAL LOW (ref >60)
GFR calc non Af Amer: 16 mL/min — ABNORMAL LOW (ref >60)
GLUCOSE: 235 mg/dL — AB (ref 65–99)
Potassium: 3 mmol/L — ABNORMAL LOW (ref 3.5–5.1)
Sodium: 155 mmol/L — ABNORMAL HIGH (ref 135–145)

## 2016-01-10 LAB — CBC
HCT: 25.4 % — ABNORMAL LOW (ref 35.0–47.0)
HEMOGLOBIN: 7.8 g/dL — AB (ref 12.0–16.0)
MCH: 25 pg — AB (ref 26.0–34.0)
MCHC: 30.7 g/dL — AB (ref 32.0–36.0)
MCV: 81.4 fL (ref 80.0–100.0)
PLATELETS: 244 10*3/uL (ref 150–440)
RBC: 3.13 MIL/uL — ABNORMAL LOW (ref 3.80–5.20)
RDW: 18.7 % — ABNORMAL HIGH (ref 11.5–14.5)
WBC: 18 10*3/uL — ABNORMAL HIGH (ref 3.6–11.0)

## 2016-01-10 LAB — GLUCOSE, CAPILLARY
GLUCOSE-CAPILLARY: 329 mg/dL — AB (ref 65–99)
GLUCOSE-CAPILLARY: 391 mg/dL — AB (ref 65–99)
Glucose-Capillary: 175 mg/dL — ABNORMAL HIGH (ref 65–99)
Glucose-Capillary: 413 mg/dL — ABNORMAL HIGH (ref 65–99)
Glucose-Capillary: 434 mg/dL — ABNORMAL HIGH (ref 65–99)

## 2016-01-10 MED ORDER — BISACODYL 10 MG RE SUPP
10.0000 mg | Freq: Every day | RECTAL | Status: DC | PRN
  Administered 2016-01-10: 10 mg via RECTAL
  Filled 2016-01-10: qty 1

## 2016-01-10 MED ORDER — POTASSIUM CHLORIDE 20 MEQ PO PACK
40.0000 meq | PACK | Freq: Once | ORAL | Status: AC
  Administered 2016-01-10: 40 meq via ORAL
  Filled 2016-01-10: qty 2

## 2016-01-10 MED ORDER — INSULIN ASPART 100 UNIT/ML ~~LOC~~ SOLN
0.0000 [IU] | Freq: Three times a day (TID) | SUBCUTANEOUS | Status: DC
  Administered 2016-01-10: 15 [IU] via SUBCUTANEOUS
  Administered 2016-01-11: 8 [IU] via SUBCUTANEOUS
  Administered 2016-01-11: 3 [IU] via SUBCUTANEOUS
  Administered 2016-01-11: 15 [IU] via SUBCUTANEOUS
  Administered 2016-01-12: 11 [IU] via SUBCUTANEOUS
  Administered 2016-01-12: 3 [IU] via SUBCUTANEOUS
  Filled 2016-01-10: qty 15
  Filled 2016-01-10: qty 8
  Filled 2016-01-10: qty 15
  Filled 2016-01-10: qty 11
  Filled 2016-01-10: qty 15
  Filled 2016-01-10: qty 3

## 2016-01-10 MED ORDER — PREDNISONE 50 MG PO TABS
50.0000 mg | ORAL_TABLET | Freq: Every day | ORAL | Status: DC
  Administered 2016-01-10: 50 mg via ORAL
  Filled 2016-01-10: qty 2
  Filled 2016-01-10: qty 1

## 2016-01-10 MED ORDER — ALBUTEROL SULFATE (2.5 MG/3ML) 0.083% IN NEBU
2.5000 mg | INHALATION_SOLUTION | Freq: Four times a day (QID) | RESPIRATORY_TRACT | Status: DC
  Administered 2016-01-10 – 2016-01-13 (×10): 2.5 mg via RESPIRATORY_TRACT
  Filled 2016-01-10 (×12): qty 3

## 2016-01-10 MED ORDER — INSULIN GLARGINE 100 UNIT/ML ~~LOC~~ SOLN
15.0000 [IU] | Freq: Every day | SUBCUTANEOUS | Status: DC
  Administered 2016-01-10: 15 [IU] via SUBCUTANEOUS
  Filled 2016-01-10 (×2): qty 0.15

## 2016-01-10 MED ORDER — INSULIN ASPART 100 UNIT/ML ~~LOC~~ SOLN
0.0000 [IU] | Freq: Every day | SUBCUTANEOUS | Status: DC
  Administered 2016-01-10 – 2016-01-11 (×2): 5 [IU] via SUBCUTANEOUS
  Filled 2016-01-10 (×2): qty 5

## 2016-01-10 MED ORDER — METOPROLOL TARTRATE 25 MG PO TABS
25.0000 mg | ORAL_TABLET | Freq: Two times a day (BID) | ORAL | Status: DC
  Administered 2016-01-10 (×2): 25 mg via ORAL
  Filled 2016-01-10 (×3): qty 1

## 2016-01-10 MED ORDER — INSULIN GLARGINE 100 UNIT/ML ~~LOC~~ SOLN
10.0000 [IU] | Freq: Every day | SUBCUTANEOUS | Status: DC
  Filled 2016-01-10: qty 0.1

## 2016-01-10 NOTE — Progress Notes (Signed)
Flutter valve given to patient with instructions. Pt. Is unable to perform.

## 2016-01-10 NOTE — Progress Notes (Signed)
md paged to notify of fsbs of 434. Awaiting returned page

## 2016-01-10 NOTE — Progress Notes (Signed)
Report called to Dava RN on 1A. Patient to move to room 151. Daughter and sons called and updated about move. Patient resting comfortably on 2 L nasal cannula. Potassium replaced orally per MD.

## 2016-01-10 NOTE — Progress Notes (Signed)
fsbs 320 . rn spoke with dr Nemiah Commander [id number] blood sugars .  Pt on d51/2 ns for hypernatremia . md to add lantus

## 2016-01-10 NOTE — Progress Notes (Signed)
[facility] [place] at [facility]   PATIENT NAME: Laura Jefferson    MR#:  [id number]  DATE OF BIRTH:  Jul 02, 1925  SUBJECTIVE:  CHIEF COMPLAINT:   Chief Complaint  Patient presents with  . Respiratory Distress   - Patient remained stable on 2 L nasal cannula. -One episode of decompensation yesterday that improved on nonrebreather mask without any intervention. -Does not appear to be tachypneic. Alert but confused  REVIEW OF SYSTEMS:  Review of Systems  Unable to perform ROS: dementia    DRUG ALLERGIES:  No Known Allergies  VITALS:  Blood pressure 146/93, pulse 76, temperature 98 F (36.7 C), temperature source Axillary, resp. rate 18, height 5' (1.524 m), weight 46.1 kg (101 lb 10.1 oz), SpO2 99 %.  PHYSICAL EXAMINATION:  Physical Exam  GENERAL:  82 y.o.-year-old patient lying in the bed not in any acute distress.  EYES: Pupils equal, round, reactive to light and accommodation. No scleral icterus. Extraocular muscles intact.  HEENT: Head atraumatic, normocephalic. Oropharynx and nasopharynx clear.  NECK:  Supple, no jugular venous distention. No thyroid enlargement, no tenderness.  LUNGS: scattered wheezing. No rhonchi, diminished breath sounds at the bases. No crackles or rales . Not using accessory muscles to breathe. CARDIOVASCULAR: S1, S2 normal. No rubs, or gallops. 2/6 systolic murmur present,. ABDOMEN: Soft, nontender, nondistended. Bowel sounds present. No organomegaly or mass.  EXTREMITIES: No pedal edema, cyanosis, or clubbing.  NEUROLOGIC: Alert and following commands, able to move all extremities in bed PSYCHIATRIC: The patient is alert. Not oriented SKIN: No obvious rash, lesion, or ulcer.    LABORATORY PANEL:   CBC  Recent Labs Lab 01/10/16 0647  WBC 18.0*  HGB 7.8*  HCT 25.4*  PLT 244   ------------------------------------------------------------------------------------------------------------------  Chemistries    Recent Labs Lab 01/09/16 0603  NA 154*  K 3.8  CL 115*  CO2 26  GLUCOSE 211*  BUN 96*  CREATININE 2.77*  CALCIUM 8.7*   ------------------------------------------------------------------------------------------------------------------  Cardiac Enzymes  Recent Labs Lab 01/09/16 0603  TROPONINI 3.38*   ------------------------------------------------------------------------------------------------------------------  RADIOLOGY:  Dg Chest Port 1 View  01/09/2016  CLINICAL DATA:  CHF, productive cough, wheezing, COPD, hypertension, diabetes mellitus, dementia EXAM: PORTABLE CHEST 1 VIEW COMPARISON:  Portable exam 1228 hours compared to 01/08/2016 at 1508 hours. FINDINGS: Enlargement of cardiac silhouette with pulmonary vascular congestion. Aspect calcification aorta. Perihilar infiltrates compatible pulmonary edema and CHF little changed. More focal opacity in the retrocardiac LEFT lower lobe could represent asymmetric edema or developing consolidation. No gross pleural effusion or pneumothorax. Bones demineralized with old posttraumatic deformity of proximal RIGHT humerus. IMPRESSION: Enlargement of cardiac silhouette with pulmonary vascular congestion and probable pulmonary edema, little changed. Persistent LEFT lower lobe opacity question asymmetric edema versus pneumonia. Electronically Signed   By: Ulyses Southward M.D.   On: 01/09/2016 12:47   Dg Chest Port 1 View  01/08/2016  CLINICAL DATA:  Respiratory failure with increased shortness of breath EXAM: PORTABLE CHEST 1 VIEW COMPARISON:  January 07, 2016 FINDINGS: There is persistent cardiomegaly with mild pulmonary venous hypertension. There is airspace consolidation in the left base with small left effusion. There is atelectatic change in the right base. There is extensive atherosclerotic change in the aorta. There is evidence of old trauma involving the proximal right humeral metaphysis. Bones are osteoporotic. IMPRESSION: Persistent  pulmonary vascular congestion. Airspace consolidation left base with small left effusion. Suspect pneumonia superimposed on a degree of congestive heart failure. There is atelectatic change in the right  base. Electronically Signed   By: Bretta Bang III M.D.   On: 01/08/2016 15:31    EKG:   Orders placed or performed during the hospital encounter of 01/01/16  . EKG 12-Lead  . EKG 12-Lead  . EKG 12-Lead  . EKG 12-Lead  . EKG 12-Lead  . EKG 12-Lead  . EKG 12-Lead  . EKG 12-Lead  . EKG 12-Lead  . EKG 12-Lead    ASSESSMENT AND PLAN:   82 year old female with history of systolic congestive heart failure, COPD chronic respiratory failure on 1 L nasal cannula who presented with shortness of breath respiratory failure on2/2/17.  1. Acute on chronic respiratory failure with hypoxia:  - Secondary to acute on chronic systolic congestive heart failure as well as COPD exacerbation, - bilateral pneumonia:  - ABG with no elevated co2 - Continue supplemental oxygen, DuoNeb treatments, steroids - on  IV Zosyn,  blood cultures negative.  - lasix on hold - flutter valve today  2. Non-Q-wave MI, appreciate cardiology's input, Continue patient on metoprolol ,aspirin ,Nitroglycerin topically - on asa and plavix - appreciate cardiology consult. - Echo is normal, per cardiologist. F/u report - significantly elevated troponin  3. Acute kidney injury on chronic kidney disease:   creatinine was 1.9-2.0 in August 2016 , - worsened renal function due to diuresis  3. Type 2 diabetes with hyperglycemia, steroid-induced hyperglycemia, - started lantus and also on Continue sliding scale coverage   4. Essential hypertension: Lopressor, blood pressure is well controlled  5. Hypernatremia- due to diuresis yesterday, on D5 1/2 NS F/u today  6. Venous thromboembolic prophylactic: Heparin subcutaneous  7. Anemia,- likely anemia of chronic disease - hb dropped with loading dose of plavix and  heparin drip for NSTEMI - stable around 8.0 now, on plavix and also aspirin - monitor for now- transfuse as needed if hb<8.0 due to acute MI  Physical therapy consult once medically stable. Palliative care consulted- plan to continue current care over the weekend and monitor    All the records are reviewed and case discussed with Care Management/Social Workerr. Management plans discussed with the patient, family and they are in agreement.  CODE STATUS: DNR  TOTAL  TIME SPENT IN TAKING CARE OF THIS PATIENT: 41 minutes.     Enid Baas M.D on 01/10/2016 at 8:22 AM  Between 7am to 6pm - Pager - [phone]  After 6pm go to [email or web address] - password [email or web address] [facility]  [facility]  Office  [phone]  CC: Primary care physician; Jerl Mina, MD

## 2016-01-10 NOTE — Progress Notes (Signed)
Pt with no bm since 01/01/16. rn spoke with dr Nemiah Commander  Who ordered dulcolax suppqd prn

## 2016-01-11 ENCOUNTER — Inpatient Hospital Stay: Payer: Medicare PPO

## 2016-01-11 LAB — BASIC METABOLIC PANEL
ANION GAP: 11 (ref 5–15)
Anion gap: 11 (ref 5–15)
BUN: 103 mg/dL — AB (ref 6–20)
BUN: 94 mg/dL — ABNORMAL HIGH (ref 6–20)
CALCIUM: 8.4 mg/dL — AB (ref 8.9–10.3)
CALCIUM: 8.6 mg/dL — AB (ref 8.9–10.3)
CHLORIDE: 119 mmol/L — AB (ref 101–111)
CHLORIDE: 123 mmol/L — AB (ref 101–111)
CO2: 28 mmol/L (ref 22–32)
CO2: 27 mmol/L (ref 22–32)
CREATININE: 2.66 mg/dL — AB (ref 0.44–1.00)
Creatinine, Ser: 2.59 mg/dL — ABNORMAL HIGH (ref 0.44–1.00)
GFR calc Af Amer: 17 mL/min — ABNORMAL LOW (ref >60)
GFR calc Af Amer: 18 mL/min — ABNORMAL LOW (ref >60)
GFR calc non Af Amer: 15 mL/min — ABNORMAL LOW (ref >60)
GFR calc non Af Amer: 15 mL/min — ABNORMAL LOW (ref >60)
GLUCOSE: 463 mg/dL — AB (ref 65–99)
Glucose, Bld: 552 mg/dL (ref 65–99)
Potassium: 4 mmol/L (ref 3.5–5.1)
Potassium: 3.8 mmol/L (ref 3.5–5.1)
SODIUM: 158 mmol/L — AB (ref 135–145)
Sodium: 161 mmol/L (ref 135–145)

## 2016-01-11 LAB — GLUCOSE, CAPILLARY
GLUCOSE-CAPILLARY: 533 mg/dL — AB (ref 65–99)
GLUCOSE-CAPILLARY: 496 mg/dL — AB (ref 65–99)
GLUCOSE-CAPILLARY: 528 mg/dL — AB (ref 65–99)
GLUCOSE-CAPILLARY: 196 mg/dL — AB (ref 65–99)
Glucose-Capillary: 313 mg/dL — ABNORMAL HIGH (ref 65–99)
Glucose-Capillary: 361 mg/dL — ABNORMAL HIGH (ref 65–99)
Glucose-Capillary: 364 mg/dL — ABNORMAL HIGH (ref 65–99)
Glucose-Capillary: 433 mg/dL — ABNORMAL HIGH (ref 65–99)
Glucose-Capillary: 458 mg/dL — ABNORMAL HIGH (ref 65–99)
Glucose-Capillary: 482 mg/dL — ABNORMAL HIGH (ref 65–99)
Glucose-Capillary: 506 mg/dL — ABNORMAL HIGH (ref 65–99)
Glucose-Capillary: 541 mg/dL — ABNORMAL HIGH (ref 65–99)

## 2016-01-11 LAB — CBC
HCT: 26.3 % — ABNORMAL LOW (ref 35.0–47.0)
Hemoglobin: 7.9 g/dL — ABNORMAL LOW (ref 12.0–16.0)
MCH: 24.9 pg — AB (ref 26.0–34.0)
MCHC: 30.1 g/dL — ABNORMAL LOW (ref 32.0–36.0)
MCV: 82.7 fL (ref 80.0–100.0)
PLATELETS: 228 10*3/uL (ref 150–440)
RBC: 3.18 MIL/uL — ABNORMAL LOW (ref 3.80–5.20)
RDW: 19.7 % — AB (ref 11.5–14.5)
WBC: 13.4 10*3/uL — ABNORMAL HIGH (ref 3.6–11.0)

## 2016-01-11 LAB — HEMOGLOBIN A1C: HEMOGLOBIN A1C: 6.5 % — AB (ref 4.0–6.0)

## 2016-01-11 MED ORDER — AMLODIPINE BESYLATE 5 MG PO TABS
5.0000 mg | ORAL_TABLET | Freq: Every day | ORAL | Status: DC
  Administered 2016-01-11 – 2016-01-12 (×2): 5 mg via ORAL
  Filled 2016-01-11 (×2): qty 1

## 2016-01-11 MED ORDER — INSULIN ASPART 100 UNIT/ML ~~LOC~~ SOLN
5.0000 [IU] | Freq: Three times a day (TID) | SUBCUTANEOUS | Status: DC
  Administered 2016-01-11 – 2016-01-12 (×3): 5 [IU] via SUBCUTANEOUS
  Filled 2016-01-11 (×3): qty 5

## 2016-01-11 MED ORDER — INSULIN DETEMIR 100 UNIT/ML ~~LOC~~ SOLN
25.0000 [IU] | Freq: Two times a day (BID) | SUBCUTANEOUS | Status: DC
  Administered 2016-01-11: 25 [IU] via SUBCUTANEOUS
  Filled 2016-01-11 (×2): qty 0.25

## 2016-01-11 MED ORDER — METOPROLOL TARTRATE 50 MG PO TABS
50.0000 mg | ORAL_TABLET | Freq: Two times a day (BID) | ORAL | Status: DC
  Administered 2016-01-11 – 2016-01-12 (×4): 50 mg via ORAL
  Filled 2016-01-11 (×4): qty 1

## 2016-01-11 MED ORDER — INSULIN DETEMIR 100 UNIT/ML ~~LOC~~ SOLN
12.0000 [IU] | Freq: Two times a day (BID) | SUBCUTANEOUS | Status: DC
Start: 2016-01-11 — End: 2016-01-12
  Administered 2016-01-11 – 2016-01-12 (×2): 12 [IU] via SUBCUTANEOUS
  Filled 2016-01-11 (×3): qty 0.12

## 2016-01-11 MED ORDER — INSULIN ASPART 100 UNIT/ML ~~LOC~~ SOLN
10.0000 [IU] | Freq: Once | SUBCUTANEOUS | Status: AC
  Administered 2016-01-11: 10 [IU] via SUBCUTANEOUS
  Filled 2016-01-11: qty 10

## 2016-01-11 MED ORDER — LACTULOSE 10 GM/15ML PO SOLN
20.0000 g | Freq: Two times a day (BID) | ORAL | Status: DC | PRN
  Administered 2016-01-11: 20 g via ORAL
  Filled 2016-01-11: qty 30

## 2016-01-11 MED ORDER — INSULIN ASPART 100 UNIT/ML ~~LOC~~ SOLN
8.0000 [IU] | Freq: Three times a day (TID) | SUBCUTANEOUS | Status: DC
  Administered 2016-01-11: 8 [IU] via SUBCUTANEOUS
  Filled 2016-01-11: qty 8

## 2016-01-11 MED ORDER — DEXTROSE 5 % IV SOLN
INTRAVENOUS | Status: DC
  Administered 2016-01-11: 09:00:00 via INTRAVENOUS

## 2016-01-11 MED ORDER — DEXTROSE 5 % IV SOLN: INTRAVENOUS | Status: DC

## 2016-01-11 MED ORDER — INSULIN ASPART 100 UNIT/ML ~~LOC~~ SOLN
10.0000 [IU] | Freq: Once | SUBCUTANEOUS | Status: AC
Start: 2016-01-11 — End: 2016-01-11
  Administered 2016-01-11: 10 [IU] via SUBCUTANEOUS
  Filled 2016-01-11: qty 10

## 2016-01-11 MED ORDER — FUROSEMIDE 10 MG/ML IJ SOLN
40.0000 mg | Freq: Once | INTRAMUSCULAR | Status: AC
  Administered 2016-01-11: 40 mg via INTRAVENOUS
  Filled 2016-01-11: qty 4

## 2016-01-11 NOTE — Progress Notes (Addendum)
Pt with noted increased effort to breathe, coarse crackles auscultated to right lobe. MD notified. New order for chest xray.

## 2016-01-11 NOTE — Care Management Note (Signed)
Case Management Note  Patient Details  Name: Laura Jefferson MRN: [id number] Date of Birth: February 24, 1925  Subjective/Objective:           Per Palliative Care, Dr Blair Dolphin note, she plans to meet with family on Monday 01/12/16 to discuss discharge planning.          Action/Plan:   Expected Discharge Date:                  Expected Discharge Plan:     In-House Referral:     Discharge planning Services     Post Acute Care Choice:    Choice offered to:     DME Arranged:    DME Agency:     HH Arranged:    HH Agency:     Status of Service:     Medicare Important Message Given:  Yes Date Medicare IM Given:    Medicare IM give by:    Date Additional Medicare IM Given:    Additional Medicare Important Message give by:     If discussed at Long Length of Stay Meetings, dates discussed:    Additional Comments:  Shellie Goettl A, RN 01/11/2016, 4:56 PM

## 2016-01-11 NOTE — Progress Notes (Signed)
MD returned page. Orders received

## 2016-01-11 NOTE — Progress Notes (Signed)
Md returned page. Orders received in regards to FSBS. Give 5 units of ssi and nighttime dose of Lantus

## 2016-01-11 NOTE — Progress Notes (Signed)
md paged for recheck fsbs of 496. No new orders at this time

## 2016-01-11 NOTE — Progress Notes (Signed)
ANTIBIOTIC CONSULT NOTE- follow up  Pharmacy Consult for vancomycin and Zosyn dosing Indication: pneumonia  No Known Allergies  Patient Measurements: Height: 5' (152.4 cm) Weight: 99 lb 7 oz (45.105 kg) IBW/kg (Calculated) : 45.5 Adjusted Body Weight: 48.9kg   Vital Signs: Temp: 99.5 F (37.5 C) (02/12 1055) Temp Source: Axillary (02/12 1055) BP: 157/42 mmHg (02/12 1055) Pulse Rate: 50 (02/12 1055) Intake/Output from previous day: 02/11 0701 - 02/12 0700 In: 1331.7 [P.O.:350; I.V.:881.7; IV Piggyback:100] Out: 0  Intake/Output from this shift: Total I/O In: 200 [P.O.:200] Out: -   Labs:  Recent Labs  01/09/16 0603 01/10/16 0647 01/11/16 0507  WBC 15.0* 18.0* 13.4*  HGB 8.4* 7.8* 7.9*  PLT 267 244 228  CREATININE 2.77* 2.46* 2.66*   Estimated Creatinine Clearance: 10.4 mL/min (by C-G formula based on Cr of 2.66).   Microbiology: Recent Results (from the past 720 hour(s))  MRSA PCR Screening     Status: Abnormal   Collection Time: 01/07/16 12:45 AM  Result Value Ref Range Status   MRSA by PCR POSITIVE (A) NEGATIVE Final    Comment: CRITICAL RESULT CALLED TO, READ BACK BY AND VERIFIED WITH: ZACHERY ALLEN AT 0437 ON 01/07/16 BY VAB        The GeneXpert MRSA Assay (FDA approved for NASAL specimens only), is one component of a comprehensive MRSA colonization surveillance program. It is not intended to diagnose MRSA infection nor to guide or monitor treatment for MRSA infections.     Medical History: Past Medical History  Diagnosis Date  . Muscle weakness   . Non-ST elevated myocardial infarction [facility])     a. likely demand ischemia 02/2015 in the setting of ARF and acute on chronic CHF  . Humerus fracture     a. left, b. 02/2015; c. in the setting of increased weakness  . Dysphagia   . Anemia   . Paroxysmal a-fib (HCC)     a. reported a-fib, no documented EKGs showing this  . Chronic systolic CHF (congestive heart failure) (HCC)     a. echo 02/2015: EF  40-45%, mod dilated LA, mildly dilated RA, mod MR/TR, mildly elevated PASP 43 mm Hg, no evidence of intracardiac shunting, intact interatrial and interventricular septa  . COPD (chronic obstructive pulmonary disease) (HCC)     a. on home O2  . Hypertensive chronic kidney disease     a. Stage 4  . Diabetes mellitus without complication (HCC)   . PVD (peripheral vascular disease) (HCC)   . Dementia   . Anxiety disorder     Medications:   Assessment: 82 y/o F with respiratory failure due to CHF/COPD exacerbations as well as PNA on empiric abx.   MRSA PCR +  Goal of Therapy:  Vancomycin trough level 15-20 mcg/ml  Plan:  Continue vancomycin 500 mg iv q 48 hours. Level before 4th dose, not at Css.  Continue Zosyn 3.375 grams q 12 hours ordered.  2/12 Scr fluctuating some 2.77>2.46>2.66.  Vancomycin trough in am 2/13.  Florence Yeung A 01/11/2016,11:09 AM

## 2016-01-11 NOTE — Progress Notes (Signed)
Pt continued with elevated BG this shift. Elevated sodium levels.  Decreased appetite. Noted increased effort to breathe, MD notified, chest x-rays showed bilateral pleural effusion. Fluids stopped pending results of BMP.

## 2016-01-11 NOTE — Progress Notes (Signed)
md paged and returned call. Notified of critical bs of 552 on labs. Orders received

## 2016-01-11 NOTE — Progress Notes (Signed)
Lab called to report critical value. Sodium level is 161. MD notified by oncoming nurse.

## 2016-01-11 NOTE — Progress Notes (Addendum)
RN called to notify the patient is breathing heavily chest x-ray has revealed enlarging pleural effusions. Lasix iv given ,  Continue D5 IV fluids and ordered repeat BMP . Nephrology consult is placed

## 2016-01-11 NOTE — Progress Notes (Signed)
[facility] - [place] at [facility]   PATIENT NAME: Laura Jefferson    MR#:  [id number]  DATE OF BIRTH:  28-Oct-1925  SUBJECTIVE:  CHIEF COMPLAINT:   Chief Complaint  Patient presents with  . Respiratory Distress   - The stent is alert but not following commands today. Stating acute phase. -Breathing comfortably. Remains on 2 L oxygen. -Sodium is increasing, at 158 this morning. Sugars were also elevated greater than 500 this morning  REVIEW OF SYSTEMS:  Review of Systems  Unable to perform ROS: dementia    DRUG ALLERGIES:  No Known Allergies  VITALS:  Blood pressure 176/74, pulse 82, temperature 98.6 F (37 C), temperature source Axillary, resp. rate 19, height 5' (1.524 m), weight 45.105 kg (99 lb 7 oz), SpO2 98 %.  PHYSICAL EXAMINATION:  Physical Exam  GENERAL:  82 y.o.-year-old patient lying in the bed not in any acute distress.  EYES: Pupils equal, round, reactive to light and accommodation. No scleral icterus. Extraocular muscles intact.  HEENT: Head atraumatic, normocephalic. Oropharynx and nasopharynx clear.  NECK:  Supple, no jugular venous distention. No thyroid enlargement, no tenderness.  LUNGS: scattered wheezing. No rhonchi, diminished breath sounds at the bases. No crackles or rales . Not using accessory muscles to breathe. CARDIOVASCULAR: S1, S2 normal. No rubs, or gallops. 2/6 systolic murmur present,. ABDOMEN: Soft, nontender, nondistended. Bowel sounds present. No organomegaly or mass.  EXTREMITIES: No pedal edema, cyanosis, or clubbing.  NEUROLOGIC: Alert and following commands, able to move all extremities in bed PSYCHIATRIC: The patient is alert. Not oriented SKIN: No obvious rash, lesion, or ulcer.    LABORATORY PANEL:   CBC  Recent Labs Lab 01/11/16 0507  WBC 13.4*  HGB 7.9*  HCT 26.3*  PLT 228    ------------------------------------------------------------------------------------------------------------------  Chemistries   Recent Labs Lab 01/11/16 0507  NA 158*  K 4.0  CL 119*  CO2 28  GLUCOSE 552*  BUN 103*  CREATININE 2.66*  CALCIUM 8.4*   ------------------------------------------------------------------------------------------------------------------  Cardiac Enzymes  Recent Labs Lab 01/09/16 0603  TROPONINI 3.38*   ------------------------------------------------------------------------------------------------------------------  RADIOLOGY:  Dg Chest Port 1 View  01/09/2016  CLINICAL DATA:  CHF, productive cough, wheezing, COPD, hypertension, diabetes mellitus, dementia EXAM: PORTABLE CHEST 1 VIEW COMPARISON:  Portable exam 1228 hours compared to 01/08/2016 at 1508 hours. FINDINGS: Enlargement of cardiac silhouette with pulmonary vascular congestion. Aspect calcification aorta. Perihilar infiltrates compatible pulmonary edema and CHF little changed. More focal opacity in the retrocardiac LEFT lower lobe could represent asymmetric edema or developing consolidation. No gross pleural effusion or pneumothorax. Bones demineralized with old posttraumatic deformity of proximal RIGHT humerus. IMPRESSION: Enlargement of cardiac silhouette with pulmonary vascular congestion and probable pulmonary edema, little changed. Persistent LEFT lower lobe opacity question asymmetric edema versus pneumonia. Electronically Signed   By: Ulyses Southward M.D.   On: 01/09/2016 12:47    EKG:   Orders placed or performed during the hospital encounter of 01/01/16  . EKG 12-Lead  . EKG 12-Lead  . EKG 12-Lead  . EKG 12-Lead  . EKG 12-Lead  . EKG 12-Lead  . EKG 12-Lead  . EKG 12-Lead  . EKG 12-Lead  . EKG 12-Lead    ASSESSMENT AND PLAN:   82 year old female with history of systolic congestive heart failure, COPD chronic respiratory failure on 1 L nasal cannula who presented with  shortness of breath respiratory failure on2/2/17.  1. Acute on chronic respiratory failure with hypoxia:  - Secondary to acute on chronic systolic  congestive heart failure as well as COPD exacerbation, - bilateral pneumonia:  - ABG with no elevated co2 - Continue supplemental oxygen, DuoNeb treatments,discontinue prednisone today - on  IV Zosyn,  blood cultures negative.  - lasix on hold - flutter valve  2. Non-Q-wave MI, appreciate cardiology's input, Continue patient on metoprolol ,aspirin ,Nitroglycerin topically - on asa and plavix - appreciate cardiology consult. - Echo is normal, per cardiologist. F/u report - significantly elevated troponin  3. Acute kidney injury on chronic kidney disease:   creatinine was 1.9-2.0 in August 2016 , - worsened renal function and also hypernatremia. -Continue to evaluate the need for nephrology consult after talking to family because they might be interested in palliative services only  3. Type 2 diabetes with hyperglycemia-check A1c. Sugars are significantly elevated. Partly because of patient on D5 and also receiving prednisone. -We will need to D5 because of her sodium. Discontinue prednisone today. -Added increased doses of Levemir twice a day and also NovoLog pre-meals. -Continue to monitor and assess need for insulin drip for hyperglycemia  4. Essential hypertension: Lopressor, added Norvasc as well  5. Hypernatremia- continue D5 half normal saline that was stopped yesterday due to elevated sugars. As sodium is still elevated and at 158 at this time   6. Venous thromboembolic prophylactic: Heparin subcutaneous  7. Anemia,- likely anemia of chronic disease - hb dropped with loading dose of plavix and heparin drip for NSTEMI - stable around 8.0 now, on plavix and also aspirin - monitor for now- transfuse as needed  Palliative care consulted- plan to continue current care over the weekend and monitor Overall prognosis appears poor at  this time   All the records are reviewed and case discussed with Care Management/Social Workerr. Management plans discussed with the patient, family and they are in agreement.  CODE STATUS: DNR  TOTAL  TIME SPENT IN TAKING CARE OF THIS PATIENT: 41 minutes.     Enid Baas M.D on 01/11/2016 at 7:54 AM  Between 7am to 6pm - Pager - [phone]  After 6pm go to [email or web address] - password [email or web address] [facility]  [facility]  Office  [phone]  CC: Primary care physician; Jerl Mina, MD

## 2016-01-11 NOTE — Progress Notes (Signed)
Md paged for fsbs results. Awaiting returned call

## 2016-01-12 LAB — CBC
HCT: 29.1 % — ABNORMAL LOW (ref 35.0–47.0)
HEMOGLOBIN: 8.8 g/dL — AB (ref 12.0–16.0)
MCH: 24.7 pg — AB (ref 26.0–34.0)
MCHC: 30.3 g/dL — ABNORMAL LOW (ref 32.0–36.0)
MCV: 81.6 fL (ref 80.0–100.0)
PLATELETS: 209 10*3/uL (ref 150–440)
RBC: 3.57 MIL/uL — AB (ref 3.80–5.20)
RDW: 18.5 % — ABNORMAL HIGH (ref 11.5–14.5)
WBC: 20.2 10*3/uL — ABNORMAL HIGH (ref 3.6–11.0)

## 2016-01-12 LAB — BASIC METABOLIC PANEL
Anion gap: 6 (ref 5–15)
BUN: 96 mg/dL — ABNORMAL HIGH (ref 6–20)
CHLORIDE: 123 mmol/L — AB (ref 101–111)
CO2: 36 mmol/L — AB (ref 22–32)
CREATININE: 2.56 mg/dL — AB (ref 0.44–1.00)
Calcium: 8.8 mg/dL — ABNORMAL LOW (ref 8.9–10.3)
GFR calc non Af Amer: 16 mL/min — ABNORMAL LOW (ref >60)
GFR, EST AFRICAN AMERICAN: 18 mL/min — AB (ref >60)
Glucose, Bld: 376 mg/dL — ABNORMAL HIGH (ref 65–99)
Potassium: 4.3 mmol/L (ref 3.5–5.1)
Sodium: 165 mmol/L (ref 135–145)

## 2016-01-12 LAB — GLUCOSE, CAPILLARY: Glucose-Capillary: 197 mg/dL — ABNORMAL HIGH (ref 65–99)

## 2016-01-12 LAB — VANCOMYCIN, TROUGH: Vancomycin Tr: 11 ug/mL (ref 10–20)

## 2016-01-12 MED ORDER — DEXTROSE 5 % IV SOLN
INTRAVENOUS | Status: DC
  Administered 2016-01-12: 13:00:00 via INTRAVENOUS

## 2016-01-12 MED ORDER — ACETAMINOPHEN 325 MG PO TABS: 650.0000 mg | ORAL_TABLET | ORAL | Status: AC | PRN

## 2016-01-12 MED ORDER — LORAZEPAM 0.5 MG PO TABS: 0.5000 mg | ORAL_TABLET | ORAL | Status: DC | PRN

## 2016-01-12 MED ORDER — MORPHINE SULFATE (CONCENTRATE) 10 MG/0.5ML PO SOLN
5.0000 mg | Freq: Three times a day (TID) | ORAL | Status: DC
  Administered 2016-01-12 – 2016-01-13 (×3): 5 mg via ORAL
  Filled 2016-01-12 (×3): qty 1

## 2016-01-12 MED ORDER — GLUCERNA 1.2 CAL PO LIQD: 1000.0000 mL | ORAL | Status: DC

## 2016-01-12 MED ORDER — MORPHINE SULFATE (CONCENTRATE) 10 MG/0.5ML PO SOLN: 5.0000 mg | ORAL | Status: AC | PRN

## 2016-01-12 MED ORDER — LACTULOSE 10 GM/15ML PO SOLN: 20.0000 g | Freq: Two times a day (BID) | ORAL | Status: AC | PRN

## 2016-01-12 MED ORDER — BISACODYL 10 MG RE SUPP: 10.0000 mg | Freq: Every day | RECTAL | Status: DC | PRN

## 2016-01-12 MED ORDER — GLUCERNA SHAKE PO LIQD
237.0000 mL | Freq: Three times a day (TID) | ORAL | Status: DC
  Administered 2016-01-12 (×2): 237 mL via ORAL

## 2016-01-12 MED ORDER — GLUCERNA 1.5 CAL PO LIQD: 1000.0000 mL | ORAL | Status: DC

## 2016-01-12 MED ORDER — PROCHLORPERAZINE 25 MG RE SUPP
25.0000 mg | Freq: Three times a day (TID) | RECTAL | Status: DC | PRN
Start: 2016-01-12 — End: 2016-01-13
  Filled 2016-01-12: qty 1

## 2016-01-12 MED ORDER — MORPHINE SULFATE (CONCENTRATE) 10 MG/0.5ML PO SOLN: 5.0000 mg | ORAL | Status: DC | PRN

## 2016-01-12 MED ORDER — PROCHLORPERAZINE 25 MG RE SUPP: 25.0000 mg | Freq: Three times a day (TID) | RECTAL | Status: AC | PRN

## 2016-01-12 MED ORDER — VANCOMYCIN HCL 500 MG IV SOLR: 500.0000 mg | INTRAVENOUS | Status: DC

## 2016-01-12 MED ORDER — BISACODYL 10 MG RE SUPP: 10.0000 mg | Freq: Every day | RECTAL | Status: AC | PRN

## 2016-01-12 MED ORDER — LORAZEPAM 0.5 MG PO TABS: 0.5000 mg | ORAL_TABLET | ORAL | Status: AC | PRN

## 2016-01-12 MED ORDER — ALBUTEROL SULFATE (2.5 MG/3ML) 0.083% IN NEBU: 2.5000 mg | INHALATION_SOLUTION | RESPIRATORY_TRACT | Status: AC | PRN

## 2016-01-12 MED ORDER — MORPHINE SULFATE (CONCENTRATE) 10 MG/0.5ML PO SOLN: 5.0000 mg | Freq: Three times a day (TID) | ORAL | Status: AC

## 2016-01-12 NOTE — Progress Notes (Signed)
ccu tech called to notify of pt rate changing to afib with rvr. Asked what pt was doing.pt resting quietly with eyes closed, resp even and unlabored. Tech stated pt back into SR at 80. Looked back in chart and pt has had the same rate change this admission. Will cont to monitor

## 2016-01-12 NOTE — Progress Notes (Signed)
Report called to [place] on 1C. Son aware of patient going to room #103. Will call orderly for transport.

## 2016-01-12 NOTE — Progress Notes (Signed)
Palliative Medicine Inpatient Consult Follow Up Note   Name: Laura Jefferson Date: 01/12/2016 MRN: [id number]  DOB: 25-Aug-1925  Referring Physician: Altamese Dilling, MD  Palliative Care consult requested for this 82 y.o. female for goals of medical therapy in patient with acute on chronic respiratory failure and acute on chronic renal failure as well as other problems as detailed below.  TODAY'S DISCUSSIONS AND DECISIONS: 1.  Refer to original consult note from 01/10/16. At that time, pt had 'perked up ' a bit in response to BIPAP and lasix. So family decided to leave pt on reasonable care in the hopes she would continue to improve. She did not. She is worse.  Her situation is such that she requires lasix to clear her pleural effusions and diastolic CHF --but this then makes her renal failure much worse and also has caused over the weekend, a severe hypernatremia.  She is awake but not able to talk today due to the hypernatremia. She was seen by nephrology who recommended more D5W. With the D5W, she has run high glucose and also has had an increase in the size of her bilateral pleural effusions.    2. Pt is breathing reasonably well today on 2.5 LPM nasal cannula O2.  She is usually on 2 LPM at home at night. BUT, with her CHF plus advanced COPD plus these new worsening effusions, it is thought that it won't be long before she is again in respiratory distress requiring either a comfort approach with low dose morphine or once again starting BIPAP or hi flow O2 etc.  Family prefers comfort approach because they now see that trying the aggressive approach over the last week has not resulted in improvement.  She is not expected to stabilze for long --she has gone back and forth between being in diastolic CHF and worsening renal failure (with diuresis).  I have consulted staff about getting a Hospice Home consult.   3.  Pt has been seen by me to have some air hunger --not currently, but she does have  this symptom. Daughter would like her mother to have a few routine doses or Roxanol with the prn doses used as her symptoms worsen over time. Pt has gotten some IV low dose morphine over the weekend.   4. She also gets anxious.  I have Rxd Ativan instead of the far too long acting Valium Rx she had at home.  5.  A portable DNR form is completed with inpt meds adjusted also. Will place an order to transfer her to 1C with a bereavement tray to room for family.    6.  I have heard that pt is set to go to [facility] tomorrow --papers signed etc.   I have updated Dr Elisabeth Pigeon and nursing staff.   BRIEF HISTORY: Pt was admitted on 01/02/16 with shortness of breath. She was placed on BIPAP. Daughter is caregiver at home. Pt had symptoms of shortness of breath for about a week at home. She had been treated with Zpak for one week at home but symptoms were thought to be due to COPD exacerbation. On arrival, she was hypoxic with bibasilar congestion and CHF diagnosed. She had leukocytosis with WBC of 19. CXR did not show over pneumonia. BIPAP was needed. She uses Ox at night at home usually. She was diuresed but renal function worsened. She had a cardiology consult and assessment was that this was more pulmonary than cardiology problem. On 2/5, her troponin was elevated at 42. 45.  She had ST -T changes on EKG. She was started on a heparin drip. An echo showed a nl EF with mild to mod MR and mildly dilated left atrium and mod to sev elevated PA pressure of 62. Cath was not recommended. Plavix was DCd due to worsening anemia. Home Health Pt was rec but family declined this offer. No active bleeding. She later had some emesis. She was started on ABX for probable pneumonia. She has gone back and forth from ICU to floor to ICU and she is again improved and off of BIPAP. BUT she developed increasing pleural effusions in her lungs and has also had worsening renal function and hypernatremia as a result of  diuresis as well as a result from poor oral intake.  She eats only bites. Her glucose has been up b/c of the D5W she has been on for the high sodium. Pt goes back and forth between CHF and renal failure and all along has COPD that is quite advanced. She has dementia and the recent MI all playing a role in her inability to recover. She goes in and out of Afib with a rapid response.  It has been hard to get her to a stable place.  Family wants to once again discuss possible comfort care.  They are more informed now about why this may be the only good option left for pt.  IMPRESSION: COPD exacerbation and chronic COPD Acute on chronic Resp flre with hypoxia due to acute on chronic systolic CHF and COPD exac and possible bilat pneumonia  And now worsening effusions NSTEMI CAD with h/o NSTEMI Dysphagia Anemia Paroxysmal Afib Chronic Systolic and diastolic CHF with EF of 40-45%, mod MR and TR 4/16 echo AFib RVR 150-160 HTN  CKD PVD Dementia Anxiety  Former Smoker --70 yrs smoking--quit 10 mos ago DM2 Dysphagia (diet d-1 with thins) Hypernatremia   ------------------------------------------------------------------------------------  REVIEW OF SYSTEMS:  Patient is not able to provide ROS due to confusion and illness  CODE STATUS: DNR   PAST MEDICAL HISTORY: Past Medical History  Diagnosis Date  . Muscle weakness   . Non-ST elevated myocardial infarction [facility])     a. likely demand ischemia 02/2015 in the setting of ARF and acute on chronic CHF  . Humerus fracture     a. left, b. 02/2015; c. in the setting of increased weakness  . Dysphagia   . Anemia   . Paroxysmal a-fib (HCC)     a. reported a-fib, no documented EKGs showing this  . Chronic systolic CHF (congestive heart failure) (HCC)     a. echo 02/2015: EF 40-45%, mod dilated LA, mildly dilated RA, mod MR/TR, mildly elevated PASP 43 mm Hg, no evidence of intracardiac shunting, intact interatrial and interventricular septa  .  COPD (chronic obstructive pulmonary disease) (HCC)     a. on home O2  . Hypertensive chronic kidney disease     a. Stage 4  . Diabetes mellitus without complication (HCC)   . PVD (peripheral vascular disease) (HCC)   . Dementia   . Anxiety disorder     PAST SURGICAL HISTORY:  Past Surgical History  Procedure Laterality Date  . Arm surgery    . Peripheral arterial stent graft    . Esophagogastroduodenoscopy (egd) with propofol N/A 11/06/2015    Procedure: ESOPHAGOGASTRODUODENOSCOPY (EGD) WITH PROPOFOL;  Surgeon: Elnita Maxwell, MD;  Location[facility] ENDOSCOPY;  Service: Endoscopy;  Laterality: N/A;    Vital Signs: BP 152/60 mmHg  Pulse 81  Temp(Src)  98 F (36.7 C) (Axillary)  Resp 26  Ht 5' (1.524 m)  Wt 46.902 kg (103 lb 6.4 oz)  BMI 20.19 kg/m2  SpO2 99% Filed Weights   01/10/16 0500 01/11/16 0500 01/12/16 0500  Weight: 46.1 kg (101 lb 10.1 oz) 45.105 kg (99 lb 7 oz) 46.902 kg (103 lb 6.4 oz)    Estimated body mass index is 20.19 kg/(m^2) as calculated from the following:   Height as of this encounter: 5' (1.524 m).   Weight as of this encounter: 46.902 kg (103 lb 6.4 oz).  PHYSICAL EXAM: Alert but not verbal today (she had been speaking and interacting with family in person and on phone when I saw her last Fri night --though with some confusion related to her dementia) EOMI OP clear Dry lips No JVD or TM Hrt rrr with irreg beats Abd soft and NT Ext no cyanosis or mottling but her hands and feet are very cold Cachectic/ wasted muscles noted She does not follow commands Pressure ulcer reported to be present   LABS: CBC:    Component Value Date/Time   WBC 20.2* 01/12/2016 0533   WBC 10.2 03/11/2015 0749   HGB 8.8* 01/12/2016 0533   HGB 8.2* 03/11/2015 0749   HCT 29.1* 01/12/2016 0533   HCT 26.4* 03/11/2015 0749   PLT 209 01/12/2016 0533   PLT 244 03/11/2015 0749   MCV 81.6 01/12/2016 0533   MCV 93 03/11/2015 0749   NEUTROABS 14.2* 01/01/2016 2247    NEUTROABS 7.4* 03/11/2015 0749   LYMPHSABS 2.7 01/01/2016 2247   LYMPHSABS 1.6 03/11/2015 0749   MONOABS 1.4* 01/01/2016 2247   MONOABS 0.8 03/11/2015 0749   EOSABS 0.8* 01/01/2016 2247   EOSABS 0.3 03/11/2015 0749   BASOSABS 0.2* 01/01/2016 2247   BASOSABS 0.0 03/11/2015 0749   Comprehensive Metabolic Panel:    Component Value Date/Time   NA 165* 01/12/2016 0533   NA CANCELED 04/29/2015 1612   NA 143 03/11/2015 0749   K 4.3 01/12/2016 0533   K 3.9 03/11/2015 0749   CL 123* 01/12/2016 0533   CL 109 03/11/2015 0749   CO2 36* 01/12/2016 0533   CO2 27 03/11/2015 0749   BUN 96* 01/12/2016 0533   BUN CANCELED 04/29/2015 1612   BUN 47* 03/11/2015 0749   CREATININE 2.56* 01/12/2016 0533   CREATININE 1.48* 03/11/2015 0749   GLUCOSE 376* 01/12/2016 0533   GLUCOSE CANCELED 04/29/2015 1612   GLUCOSE 209* 03/11/2015 0749   CALCIUM 8.8* 01/12/2016 0533   CALCIUM 8.0* 03/11/2015 0749   AST 22 07/22/2015 2116   AST 18 03/07/2015 1730   ALT 13* 07/22/2015 2116   ALT 12* 03/07/2015 1730   ALKPHOS 60 07/22/2015 2116   ALKPHOS 68 03/07/2015 1730   BILITOT 0.2* 07/22/2015 2116   BILITOT 0.4 03/07/2015 1730   PROT 7.4 07/22/2015 2116   PROT 6.1* 03/07/2015 1730   ALBUMIN 3.5 07/22/2015 2116   ALBUMIN 2.5* 03/07/2015 1730     More than 50% of the visit was spent in counseling/coordination of care: YES  Time Spent:  65 min

## 2016-01-12 NOTE — Progress Notes (Signed)
MD notified of critical Na of 165. No new orders at this time

## 2016-01-12 NOTE — Progress Notes (Signed)
ANTIBIOTIC CONSULT NOTE- follow up  Pharmacy Consult for vancomycin and Zosyn dosing Indication: pneumonia  No Known Allergies  Patient Measurements: Height: 5' (152.4 cm) Weight: 103 lb 6.4 oz (46.902 kg) IBW/kg (Calculated) : 45.5 Adjusted Body Weight: 48.9kg   Vital Signs: Temp: 99.4 F (37.4 C) (02/13 0449) Temp Source: Oral (02/13 0449) BP: 158/54 mmHg (02/13 0449) Pulse Rate: 98 (02/13 0449) Intake/Output from previous day: 02/12 0701 - 02/13 0700 In: 203 [P.O.:200; I.V.:3] Out: 0  Intake/Output from this shift: Total I/O In: 3 [I.V.:3] Out: 0   Labs:  Recent Labs  01/10/16 0647 01/11/16 0507 01/11/16 1950 01/12/16 0533  WBC 18.0* 13.4*  --  20.2*  HGB 7.8* 7.9*  --  8.8*  PLT 244 228  --  209  CREATININE 2.46* 2.66* 2.59* 2.56*   Estimated Creatinine Clearance: 10.9 mL/min (by C-G formula based on Cr of 2.56).   Microbiology: Recent Results (from the past 720 hour(s))  MRSA PCR Screening     Status: Abnormal   Collection Time: 01/07/16 12:45 AM  Result Value Ref Range Status   MRSA by PCR POSITIVE (A) NEGATIVE Final    Comment: CRITICAL RESULT CALLED TO, READ BACK BY AND VERIFIED WITH: ZACHERY ALLEN AT 0437 ON 01/07/16 BY VAB        The GeneXpert MRSA Assay (FDA approved for NASAL specimens only), is one component of a comprehensive MRSA colonization surveillance program. It is not intended to diagnose MRSA infection nor to guide or monitor treatment for MRSA infections.     Medical History: Past Medical History  Diagnosis Date  . Muscle weakness   . Non-ST elevated myocardial infarction [facility])     a. likely demand ischemia 02/2015 in the setting of ARF and acute on chronic CHF  . Humerus fracture     a. left, b. 02/2015; c. in the setting of increased weakness  . Dysphagia   . Anemia   . Paroxysmal a-fib (HCC)     a. reported a-fib, no documented EKGs showing this  . Chronic systolic CHF (congestive heart failure) (HCC)     a. echo  02/2015: EF 40-45%, mod dilated LA, mildly dilated RA, mod MR/TR, mildly elevated PASP 43 mm Hg, no evidence of intracardiac shunting, intact interatrial and interventricular septa  . COPD (chronic obstructive pulmonary disease) (HCC)     a. on home O2  . Hypertensive chronic kidney disease     a. Stage 4  . Diabetes mellitus without complication (HCC)   . PVD (peripheral vascular disease) (HCC)   . Dementia   . Anxiety disorder     Medications:   Assessment: 82 y/o F with respiratory failure due to CHF/COPD exacerbations as well as PNA on empiric abx.   MRSA PCR +  Goal of Therapy:  Vancomycin trough level 15-20 mcg/ml  Plan:  Continue vancomycin 500 mg iv q 48 hours. Level before 4th dose, not at Css.  Continue Zosyn 3.375 grams q 12 hours ordered.  2/12 Scr fluctuating some 2.77>2.46>2.66.  Vancomycin trough in am 2/13.  2/13 AM vanc level 11. Changed to 500 mg q 36 hours. Level before 3rd new dose.  Rion Catala S 01/12/2016,6:50 AM

## 2016-01-12 NOTE — Progress Notes (Signed)
Inpatient Diabetes Program Recommendations  AACE/ADA: New Consensus Statement on Inpatient Glycemic Control (2015)  Target Ranges:  Prepandial:   less than 140 mg/dL      Peak postprandial:   less than 180 mg/dL (1-2 hours)      Critically ill patients:  140 - 180 mg/dL  Results for Laura Jefferson, Laura Jefferson (MRN [id number]) as of 01/12/2016 09:02  Ref. Range 01/11/2016 07:20 01/11/2016 11:26 01/11/2016 15:28 01/11/2016 17:36 01/11/2016 21:15  Glucose-Capillary Latest Ref Range: 65-99 mg/dL 315 (H) 176 (H) 160 (H) 433 (H) 364 (H)   Review of Glycemic Control  Current orders for Inpatient glycemic control: Levemir 12 BID, Novolog 5 units TID with meals for meal coverage, Novolog 0-15 units TID with meals, Novolog 0-5 units HS  Inpatient Diabetes Program Recommendations: Insulin - Basal: Please consider increasing Levemir to 15 units BID. Diet: If appropriate, may want to consider changning Ensure to Glucerna which will provide less carbohydrates to help improve glycemic control.  Thanks, Orlando Penner, RN, MSN, CDE Diabetes Coordinator Inpatient Diabetes Program [phone] (Team Pager from 8am to 5pm) [phone] ([facility] office) [phone] [facility] office) [phone] [facility] office)

## 2016-01-12 NOTE — Care Management Important Message (Signed)
Important Message  Patient Details  Name: ROAN ALLEGRA MRN: [id number] Date of Birth: 08-23-1925   Medicare Important Message Given:  Yes    France Lusty A, RN 01/12/2016, 11:24 AM

## 2016-01-12 NOTE — Progress Notes (Signed)
[facility] - [place] at [facility]   PATIENT NAME: Haileigh Westrope    MR#:  [id number]  DATE OF BIRTH:  November 16, 1925  SUBJECTIVE:  CHIEF COMPLAINT:   Chief Complaint  Patient presents with  . Respiratory Distress   - Patient is alert but not following commands . -Breathing comfortably. Remains on 2 L oxygen. -Sodium is increasing, at 161 this morning.   REVIEW OF SYSTEMS:  Review of Systems  Unable to perform ROS: dementia    DRUG ALLERGIES:  No Known Allergies  VITALS:  Blood pressure 152/60, pulse 81, temperature 98 F (36.7 C), temperature source Axillary, resp. rate 26, height 5' (1.524 m), weight 46.902 kg (103 lb 6.4 oz), SpO2 98 %.  PHYSICAL EXAMINATION:  Physical Exam  GENERAL:  82 y.o.-year-old patient lying in the bed not in any acute distress.  EYES: Pupils equal, round, reactive to light and accommodation. No scleral icterus. Extraocular muscles intact.  HEENT: Head atraumatic, normocephalic. Oropharynx and nasopharynx clear.  NECK:  Supple, no jugular venous distention. No thyroid enlargement, no tenderness.  LUNGS: scattered wheezing. No rhonchi, diminished breath sounds at the bases. No crackles or rales . Not using accessory muscles to breathe. CARDIOVASCULAR: S1, S2 normal. No rubs, or gallops. 2/6 systolic murmur present,. ABDOMEN: Soft, nontender, nondistended. Bowel sounds present. No organomegaly or mass.  EXTREMITIES: No pedal edema, cyanosis, or clubbing.  NEUROLOGIC: Alert but not following commands, able to move all extremities in bed, appears very weak. PSYCHIATRIC: The patient is alert. Not oriented SKIN: No obvious rash, lesion, or ulcer.    LABORATORY PANEL:   CBC  Recent Labs Lab 01/12/16 0533  WBC 20.2*  HGB 8.8*  HCT 29.1*  PLT 209   ------------------------------------------------------------------------------------------------------------------  Chemistries   Recent Labs Lab 01/12/16 0533  NA 165*   K 4.3  CL 123*  CO2 36*  GLUCOSE 376*  BUN 96*  CREATININE 2.56*  CALCIUM 8.8*   ------------------------------------------------------------------------------------------------------------------  Cardiac Enzymes  Recent Labs Lab 01/09/16 0603  TROPONINI 3.38*   ------------------------------------------------------------------------------------------------------------------  RADIOLOGY:  Dg Chest 1 View  01/11/2016  CLINICAL DATA:  82 year old with current history of chronic combined systolic and diastolic CHF, chronic kidney disease, with acute onset of shortness of breath. Possible aspiration. Followup CHF and left lower lobe asymmetric pulmonary edema versus pneumonia. EXAM: Portable CHEST 1 VIEW COMPARISON:  01/09/2016 and earlier. FINDINGS: Cardiac silhouette moderately to markedly enlarged. Mild diffuse interstitial pulmonary edema, unchanged. Enlarging bilateral pleural effusions. Dense consolidation in the left lower lobe, unchanged. New dense consolidation in the right lower lobe. IMPRESSION: 1. Stable mild diffuse interstitial pulmonary edema. 2. Enlarging bilateral pleural effusions. 3. Stable dense passive atelectasis versus pneumonia in the left lower lobe. 4. New dense passive atelectasis versus pneumonia in the right lower lobe. Electronically Signed   By: Hulan Saas M.D.   On: 01/11/2016 18:52     ASSESSMENT AND PLAN:   82 year old female with history of systolic congestive heart failure, COPD chronic respiratory failure on 1 L nasal cannula who presented with shortness of breath respiratory failure on2/2/17.  1. Acute on chronic respiratory failure with hypoxia:  - Secondary to acute on chronic systolic congestive heart failure as well as COPD exacerbation, - bilateral pneumonia:  - ABG with no elevated co2 - Continue supplemental oxygen, DuoNeb treatments - was on  IV Zosyn,  blood cultures negative.  - lasix on hold - flutter valve - today made comfort  care by palliative care.  2. Non-Q-wave MI,  appreciate cardiology's input, Continue patient on metoprolol ,aspirin ,Nitroglycerin topically - on asa and plavix - appreciate cardiology consult. - Echo is normal, per cardiologist. F/u report - significantly elevated troponin  3. Acute kidney injury on chronic kidney disease:   creatinine was 1.9-2.0 in August 2016 , - worsened renal function and also hypernatremia. - now comfort care.  3. Type 2 diabetes with hyperglycemia- -Added increased doses of Levemir twice a day and also NovoLog pre-meals. -Continue to monitor.  4. Essential hypertension: Lopressor, added Norvasc as well  5. Hypernatremia- continue D5 half normal saline  6. Venous thromboembolic prophylactic: Heparin subcutaneous  7. Anemia,- likely anemia of chronic disease - hb dropped with loading dose of plavix and heparin drip for NSTEMI - stable around 8.0 now, on plavix and also aspirin - monitor for now- transfuse as needed - now comfort care.   All the records are reviewed and case discussed with Care Management/Social Workerr. Management plans discussed with the patient, family and they are in agreement.  CODE STATUS: DNR  TOTAL  TIME SPENT IN TAKING CARE OF THIS PATIENT: 30 minutes.     Altamese Dilling M.D on 01/12/2016 at 8:59 PM  Between 7am to 6pm - Pager - [phone]  After 6pm go to [email or web address] - password [email or web address] [facility]  [place] [facility]  Office  [phone]  CC: Primary care physician; Jerl Mina, MD

## 2016-01-12 NOTE — Progress Notes (Signed)
New hospice home referral received following a Palliative Medicine consult. Laura Jefferson is an 82 year old woman admitted to [facility] on 2/2 for treatment of acute respiratory failure requiring Bipap, chest x-ray in the ED consistent with basilar congestion and CHF as well as elevated white count. She has been requiring Bipap intermittently., requiring transfers in and out of the ICU for rapid A-fib with RVR and respiratory issues. Family met with Palliative Medicine physician Dr. Megan Salon on 2/10 and again today as patient has not improved with medical treatment. Family has decided to focus on comfort at the hospice home. Writer met in the family room with patient's daughter and HCPOA Seward Carol, son Zonnie Landen and granddaughter Tanzania. Writer initiated education regarding hospice services, philosophy and team approach to care with good understanding voiced. Questions answered, consents signed. Plan is for patient to be transferred to the hospice home tomorrow 2/14 via EMS with signed portable DNR in place.  Updated information faxed to referral. Hospital care team aware of and in agreement with plan. Writer to follow through final disposition. Thank you for the opportunity to be involved in the care of this patient and her family. Flo Shanks RN, BSN, [place] and [facility] [place], [facility] [phone] c

## 2016-01-12 NOTE — Consult Note (Signed)
[facility]  CONSULT NOTE    Date: 01/12/2016                  Patient Name:  Laura Jefferson  MRN: [id number]  DOB: [birth year]  Age / Sex: 82 y.o., female         PCP: [place] Pink, MD                 Service Requesting Consult: Dr. Anselm Jungling                 Reason for Consult: Acute renal failure            History of Present Illness: Ms. Laura Jefferson is a 82 y.o. white female with dementia, coronary artery disease, atrial fibrillation, congestive heart failure, COPD, hypertension, diabetes mellitus type II, peripheral vascular disease , who was admitted to [facility] on 01/01/2016 for Respiratory distress [R06.00] Congestive heart failure, unspecified congestive heart failure chronicity, unspecified congestive heart failure type ([place]) [I50.9] Chronic obstructive pulmonary disease, unspecified COPD type [facility]) [J44.9]  Nephrology consulted for acute renal failure and hypernatremia. She was given furosemide on 2/3,2/6, 2/7, and 2/12. Poor PO intake. Sodium 165. Contraction acidosis.   Patient is having difficulty eating and requires assistance.    Medications: Outpatient medications: Prescriptions prior to admission  Medication Sig Dispense Refill Last Dose  . albuterol (PROVENTIL HFA;VENTOLIN HFA) 108 (90 BASE) MCG/ACT inhaler Inhale 1 puff into the lungs every 6 (six) hours as needed for wheezing or shortness of breath.   unknown at unknown  . aspirin EC 81 MG tablet Take 81 mg by mouth at bedtime.   unknown at unknown  . azithromycin (ZITHROMAX) 250 MG tablet Take 1 tablet by mouth daily.  0 01/01/2016 at Unknown time  . Cholecalciferol (VITAMIN D) 2000 UNITS tablet Take 2,000 Units by mouth daily.   unknown at unknown  . diazepam (VALIUM) 10 MG tablet Take 10 mg by mouth daily as needed for anxiety.   unknown at unknown  . docusate sodium (COLACE) 100 MG capsule Take 100-200 mg by mouth 2 (two) times daily as needed for mild constipation.   unknown at unknown   . folic acid (FOLVITE) 1 MG tablet Take 1 mg by mouth at bedtime.   unknown at unknown  . furosemide (LASIX) 40 MG tablet Take 40 mg by mouth daily.    unknown at unknown  . glipiZIDE (GLUCOTROL XL) 10 MG 24 hr tablet Take 10 mg by mouth daily.   unknown at unknown  . HYDROcodone-acetaminophen (NORCO/VICODIN) 5-325 MG per tablet Take 1 tablet by mouth every 6 (six) hours as needed for moderate pain.   unknown at unknown  . metFORMIN (GLUCOPHAGE) 500 MG tablet Take by mouth. Takes 1000 mg am and 500 mg pm daily.   unknown at unknown  . metoprolol tartrate (LOPRESSOR) 25 MG tablet Take 25 mg by mouth 2 (two) times daily.   unknown at unknown  . Omeprazole (PRILOSEC PO) Take by mouth daily.   unknown at unknown  . omeprazole (PRILOSEC) 40 MG capsule Take 40 mg by mouth daily.  0 01/01/2016 at Unknown time  . PARoxetine (PAXIL) 20 MG tablet Take 20 mg by mouth daily.   unknown at unknown  . predniSONE (DELTASONE) 10 MG tablet Take 10 mg by mouth See admin instructions. Take 4 tablets everyday for 3 days, taper down.  0 01/01/2016 at Unknown time  . psyllium (METAMUCIL) 58.6 % packet Take 1 packet  by mouth daily as needed (for constipation).   unknown at unknown  . risperiDONE (RISPERDAL) 0.5 MG tablet Take 0.5 mg by mouth at bedtime.    unknown at unknown  . simvastatin (ZOCOR) 20 MG tablet Take 20 mg by mouth at bedtime.    unknown at unknown  . Tiotropium Bromide Monohydrate 2.5 MCG/ACT AERS Inhale 2 puffs into the lungs daily.    unknown at unknown  . vitamin B-12 (CYANOCOBALAMIN) 1000 MCG tablet Take 1,000 mcg by mouth daily.   [place] at [place]    Current medications: Current Facility-Administered Medications  Medication Dose Route Frequency Provider Last Rate Last Dose  . acetaminophen (TYLENOL) tablet 650 mg  650 mg Oral Q6H PRN Lance Coon, MD      . albuterol (PROVENTIL) (2.5 MG/3ML) 0.083% nebulizer solution 2.5 mg  2.5 mg Nebulization Q6H Gladstone Lighter, MD   2.5 mg at 01/12/16 0810   . amLODipine (NORVASC) tablet 5 mg  5 mg Oral Daily Gladstone Lighter, MD   5 mg at 01/12/16 0756  . antiseptic oral rinse (CPC / CETYLPYRIDINIUM CHLORIDE 0.05%) solution 7 mL  7 mL Mouth Rinse q12n4p Gladstone Lighter, MD   7 mL at 01/12/16 1143  . aspirin chewable tablet 81 mg  81 mg Oral Daily Gladstone Lighter, MD   81 mg at 01/12/16 0756  . atorvastatin (LIPITOR) tablet 40 mg  40 mg Oral q1800 Theodoro Grist, MD   40 mg at 01/11/16 2146  . bisacodyl (DULCOLAX) suppository 10 mg  10 mg Rectal Daily PRN Gladstone Lighter, MD   10 mg at 01/10/16 1228  . budesonide (PULMICORT) nebulizer solution 0.25 mg  0.25 mg Nebulization BID Wilhelmina Mcardle, MD   0.25 mg at 01/12/16 0810  . chlorhexidine (PERIDEX) 0.12 % solution 15 mL  15 mL Mouth Rinse BID Gladstone Lighter, MD   15 mL at 01/12/16 0755  . clopidogrel (PLAVIX) tablet 75 mg  75 mg Oral Daily Gladstone Lighter, MD   75 mg at 01/12/16 0800  . diazepam (VALIUM) tablet 2 mg  2 mg Oral Q6H PRN Wilhelmina Mcardle, MD      . docusate sodium (COLACE) capsule 100 mg  100 mg Oral BID Theodoro Grist, MD   100 mg at 01/12/16 0756  . feeding supplement (ENSURE ENLIVE) (ENSURE ENLIVE) liquid 237 mL  237 mL Oral TID BM Gladstone Lighter, MD   237 mL at 01/12/16 1000  . feeding supplement (GLUCERNA SHAKE) (GLUCERNA SHAKE) liquid 237 mL  237 mL Oral TID BM Vaughan Basta, MD      . heparin injection 5,000 Units  5,000 Units Subcutaneous Q12H Charlett Nose, RPH   5,000 Units at 01/12/16 0754  . insulin aspart (novoLOG) injection 0-15 Units  0-15 Units Subcutaneous TID WC Gladstone Lighter, MD   3 Units at 01/12/16 1142  . insulin aspart (novoLOG) injection 0-5 Units  0-5 Units Subcutaneous QHS Gladstone Lighter, MD   5 Units at 01/11/16 2222  . insulin aspart (novoLOG) injection 5 Units  5 Units Subcutaneous TID WC Gladstone Lighter, MD   5 Units at 01/12/16 1142  . insulin detemir (LEVEMIR) injection 12 Units  12 Units Subcutaneous BID Gladstone Lighter,  MD   12 Units at 01/12/16 0754  . lactulose (CHRONULAC) 10 GM/15ML solution 20 g  20 g Oral BID PRN Gladstone Lighter, MD   20 g at 01/11/16 1240  . metoprolol (LOPRESSOR) tablet 50 mg  50 mg Oral BID Gladstone Lighter, MD  50 mg at 01/12/16 0756  . morphine 2 MG/ML injection 1-2 mg  1-2 mg Intravenous Q1H PRN Wilhelmina Mcardle, MD   1 mg at 01/11/16 1929  . nitroGLYCERIN (NITROGLYN) 2 % ointment 1 inch  1 inch Topical 3 times per day Theodoro Grist, MD   1 inch at 01/12/16 0646  . ondansetron (ZOFRAN) tablet 4 mg  4 mg Oral Q6H PRN Lance Coon, MD       Or  . ondansetron [facility]) injection 4 mg  4 mg Intravenous Q6H PRN Lance Coon, MD      . piperacillin-tazobactam (ZOSYN) IVPB 3.375 g  3.375 g Intravenous Q12H Lance Coon, MD   3.375 g at 01/12/16 0755  . senna (SENOKOT) tablet 8.6 mg  1 tablet Oral Daily Theodoro Grist, MD   8.6 mg at 01/12/16 0756  . sodium chloride flush (NS) 0.9 % injection 3 mL  3 mL Intravenous Q12H Lance Coon, MD   3 mL at 01/11/16 2143  . [START ON 01/13/2016] vancomycin (VANCOCIN) 500 mg in sodium chloride 0.9 % 100 mL IVPB  500 mg Intravenous Q36H Lance Coon, MD          Allergies: No Known Allergies    Past Medical History: Past Medical History  Diagnosis Date  . Muscle weakness   . Non-ST elevated myocardial infarction [facility])     a. likely demand ischemia 02/2015 in the setting of ARF and acute on chronic CHF  . Humerus fracture     a. left, b. 02/2015; c. in the setting of increased weakness  . Dysphagia   . Anemia   . Paroxysmal a-fib (HCC)     a. reported a-fib, no documented EKGs showing this  . Chronic systolic CHF (congestive heart failure) ([place])     a. echo 02/2015: EF 40-45%, mod dilated LA, mildly dilated RA, mod MR/TR, mildly elevated PASP 43 mm Hg, no evidence of intracardiac shunting, intact interatrial and interventricular septa  . COPD (chronic obstructive pulmonary disease) ([place])     a. on home O2  . Hypertensive chronic kidney disease      a. Stage 4  . Diabetes mellitus without complication ([place])   . PVD (peripheral vascular disease) ([place])   . Dementia   . Anxiety disorder      Past Surgical History: Past Surgical History  Procedure Laterality Date  . Arm surgery    . Peripheral arterial stent graft    . Esophagogastroduodenoscopy (egd) with propofol N/A 11/06/2015    Procedure: ESOPHAGOGASTRODUODENOSCOPY (EGD) WITH PROPOFOL;  Surgeon: Josefine Class, MD;  [facility] ENDOSCOPY;  Service: Endoscopy;  Laterality: N/A;     Family History: Family History  Problem Relation Age of Onset  . CAD Son      Social History: Social History   Social History  . Marital Status: Widowed    Spouse Name: N/A  . Number of Children: N/A  . Years of Education: N/A   Occupational History  . Not on file.   Social History Main Topics  . Smoking status: Former Smoker -- 61 years    Types: Cigarettes    Quit date: 02/24/2015  . Smokeless tobacco: Not on file  . Alcohol Use: No  . Drug Use: No  . Sexual Activity: Not on file   Other Topics Concern  . Not on file   Social History Narrative     Review of Systems: Review of Systems  Unable to perform ROS: dementia    Vital  Signs: Blood pressure 152/60, pulse 81, temperature 98 F (36.7 C), temperature source Axillary, resp. rate 26, height 5' (1.524 m), weight 46.902 kg (103 lb 6.4 oz), SpO2 97 %.  Weight trends: Filed Weights   01/10/16 0500 01/11/16 0500 01/12/16 0500  Weight: 46.1 kg (101 lb 10.1 oz) 45.105 kg (99 lb 7 oz) 46.902 kg (103 lb 6.4 oz)    Physical Exam: General: NAD, cachectic.   Head: Normocephalic, atraumatic. Moist oral mucosal membranes  Eyes: Anicteric, PERRL  Neck: Supple, trachea midline  Lungs:  Clear to auscultation  Heart: Regular rate and rhythm  Abdomen:  Soft, nontender   Extremities: no peripheral edema.  Neurologic: Nonverbal. Not following commands, moving all four extremities.   Skin: No lesions         Lab results: Basic Metabolic Panel:  Recent Labs Lab 01/11/16 0507 01/11/16 1950 01/12/16 0533  NA 158* 161* 165*  K 4.0 3.8 4.3  CL 119* 123* 123*  CO2 28 27 36*  GLUCOSE 552* 463* 376*  BUN 103* 94* 96*  CREATININE 2.66* 2.59* 2.56*  CALCIUM 8.4* 8.6* 8.8*    Liver Function Tests: No results for input(s): AST, ALT, ALKPHOS, BILITOT, PROT, ALBUMIN in the last 168 hours. No results for input(s): LIPASE, AMYLASE in the last 168 hours. No results for input(s): AMMONIA in the last 168 hours.  CBC:  Recent Labs Lab 01/07/16 0426 01/09/16 0603 01/10/16 0647 01/11/16 0507 01/12/16 0533  WBC 15.2* 15.0* 18.0* 13.4* 20.2*  HGB 8.3* 8.4* 7.8* 7.9* 8.8*  HCT 26.2* 26.9* 25.4* 26.3* 29.1*  MCV 79.6* 81.2 81.4 82.7 81.6  PLT 243 267 244 228 209    Cardiac Enzymes:  Recent Labs Lab 01/07/16 0426 01/09/16 0603  TROPONINI 11.08* 3.38*    BNP: Invalid input(s): POCBNP  CBG:  Recent Labs Lab 01/11/16 1126 01/11/16 1528 01/11/16 1736 01/11/16 2115 01/12/16 1127  GLUCAP 196* 313* 433* 56* 197*    Microbiology: Results for orders placed or performed during the hospital encounter of 01/01/16  MRSA PCR Screening     Status: Abnormal   Collection Time: 01/07/16 12:45 AM  Result Value Ref Range Status   MRSA by PCR POSITIVE (A) NEGATIVE Final    Comment: CRITICAL RESULT CALLED TO, READ BACK BY AND VERIFIED WITH: ZACHERY ALLEN AT 7948 ON 01/07/16 BY VAB        The GeneXpert MRSA Assay (FDA approved for NASAL specimens only), is one component of a comprehensive MRSA colonization surveillance program. It is not intended to diagnose MRSA infection nor to guide or monitor treatment for MRSA infections.     Coagulation Studies: No results for input(s): LABPROT, INR in the last 72 hours.  Urinalysis: No results for input(s): COLORURINE, LABSPEC, PHURINE, GLUCOSEU, HGBUR, BILIRUBINUR, KETONESUR, PROTEINUR, UROBILINOGEN, NITRITE, LEUKOCYTESUR in the last  72 hours.  Invalid input(s): APPERANCEUR    Imaging: Dg Chest 1 View  01/11/2016  CLINICAL DATA:  82 year old with current history of chronic combined systolic and diastolic CHF, chronic kidney disease, with acute onset of shortness of breath. Possible aspiration. Followup CHF and left lower lobe asymmetric pulmonary edema versus pneumonia. EXAM: Portable CHEST 1 VIEW COMPARISON:  01/09/2016 and earlier. FINDINGS: Cardiac silhouette moderately to markedly enlarged. Mild diffuse interstitial pulmonary edema, unchanged. Enlarging bilateral pleural effusions. Dense consolidation in the left lower lobe, unchanged. New dense consolidation in the right lower lobe. IMPRESSION: 1. Stable mild diffuse interstitial pulmonary edema. 2. Enlarging bilateral pleural effusions. 3. Stable dense passive atelectasis versus pneumonia  in the left lower lobe. 4. New dense passive atelectasis versus pneumonia in the right lower lobe. Electronically Signed   By: Evangeline Dakin M.D.   On: 01/11/2016 18:52      Assessment & Plan: Ms. JUNELL CULLIFER is a 82 y.o. white female with dementia, coronary artery disease, atrial fibrillation, congestive heart failure, COPD, hypertension, diabetes mellitus type II, peripheral vascular disease , who was admitted to [facility] on 01/01/2016  1. Acute Renal failure on chronic kidney disease stage IV: baseline creatinine of 2.03 with eGFR of 21 on admission 01/01/16.  Acute renal failure from prerenal azotemia from poor PO intake and overdiuresis. Now with contraction alkalosis and hypernatremia with free water deficit.  Chronic kidney disease secondary to age, hypertension, and diabetes.  No urine output is being recorded.  - start IV fluids: D5W at 75.  - do not give any more diuretics including furosemide.   2. Hypernatremia: 3.8 litres of free water deficit.  - D5W as above.  - monitor volume status.   3. Diabetes Mellitus type II with chronic kidney disease: not well controlled.  Hemoglobin A1c of 6.5% - with D5W, will need stricter control.   4. Hypertension: blood pressure at goal, mildly elevated systolic.  - holding diuretics.  - amlodipine.      LOS: [place], [place] 2/13/201712:18 PM

## 2016-01-12 NOTE — Care Management Note (Addendum)
Case Management Note  Patient Details  Name: BREIONNA CHUGH MRN: [id number] Date of Birth: 1925-06-01  Subjective/Objective:       82yo Mrs Yurani Germain was admitted 01/01/16 per shortness of breath. Dx: COPD exacerbation, pleural effusions. Hx: COPD, CHF, DM, CKD, HTN, CAD, Dementia, Dysphagia. Resides with daughter Briant Cedar who is her caretaker. Chronic 2L N/C provided by Christoper Allegra. Has used [facility] in the past and if home health is ordered they would like to use [facility] again. Dr Orvan Falconer saw Mrs Kemmerling last Friday and per her note, Dr Orvan Falconer will meet with the family today to discuss care options for Mrs Icenhower. DNR order but no Comfort Care orders.   Case management will follow for discharge planning.            Action/Plan:   Expected Discharge Date:                  Expected Discharge Plan:     In-House Referral:     Discharge planning Services     Post Acute Care Choice:    Choice offered to:     DME Arranged:    DME Agency:     HH Arranged:    HH Agency:     Status of Service:     Medicare Important Message Given:  Yes Date Medicare IM Given:    Medicare IM give by:    Date Additional Medicare IM Given:    Additional Medicare Important Message give by:     If discussed at Long Length of Stay Meetings, dates discussed:    Additional Comments:  Galadriel Shroff A, RN 01/12/2016, 11:40 AM

## 2016-01-13 DIAGNOSIS — J9601 Acute respiratory failure with hypoxia: Secondary | ICD-10-CM

## 2016-01-13 DIAGNOSIS — J441 Chronic obstructive pulmonary disease with (acute) exacerbation: Secondary | ICD-10-CM

## 2016-01-13 DIAGNOSIS — Z66 Do not resuscitate: Secondary | ICD-10-CM

## 2016-01-13 DIAGNOSIS — E1165 Type 2 diabetes mellitus with hyperglycemia: Secondary | ICD-10-CM

## 2016-01-13 DIAGNOSIS — J90 Pleural effusion, not elsewhere classified: Secondary | ICD-10-CM

## 2016-01-13 DIAGNOSIS — E870 Hyperosmolality and hypernatremia: Secondary | ICD-10-CM

## 2016-01-13 DIAGNOSIS — J45909 Unspecified asthma, uncomplicated: Secondary | ICD-10-CM

## 2016-01-13 DIAGNOSIS — R630 Anorexia: Secondary | ICD-10-CM

## 2016-01-13 DIAGNOSIS — Z8701 Personal history of pneumonia (recurrent): Secondary | ICD-10-CM

## 2016-01-13 NOTE — Clinical Social Work Note (Signed)
Pt is ready for discharge today to [facility]. Dayna Barker, Hospital Liaison, made arrangements for transfer. CSW prepared discharge packet. Pt's family is aware and agreeable to discharge plan. Report has been called. [facility] will provide transportation. CSW is signing off as no further needs identified.   Dede Query, MSW, LCSW Clinical Social Worker  [phone]

## 2016-01-13 NOTE — Progress Notes (Signed)
Follow up visit made on new hospice home referral. Patient seen lying in bed, appeared to be sleeping soundly, no s/s of distress. Report called to [facility], EMS notified for transport. Writer contacted patient's daughter Briant Cedar to advise her that EMS had been called. She plans to meet her mother at the hospice home. Hospital care team aware of and in agreement with discharge plan. Signed portable DNR in place in transfer packet. Consents and discharge summary faxed to referral. Thank you for the opportunity to be involved in the care of this patient. Dayna Barker RN, BSN, [facility] and [facility] of [place], [facility] [phone] c

## 2016-01-13 NOTE — Discharge Summary (Signed)
[facility] - [place] at [facility]   PATIENT NAME: Laura Jefferson    MR#:  [id number]  DATE OF BIRTH:  July 14, 1925  DATE OF ADMISSION:  01/01/2016 ADMITTING PHYSICIAN: Oralia Manis, MD  DATE OF DISCHARGE: 01/13/2016  PRIMARY CARE PHYSICIAN: Jerl Mina, MD    ADMISSION DIAGNOSIS:  Respiratory distress [R06.00] Congestive heart failure, unspecified congestive heart failure chronicity, unspecified congestive heart failure type (HCC) [I50.9] Chronic obstructive pulmonary disease, unspecified COPD type (HCC) [J44.9]  DISCHARGE DIAGNOSIS:  Principal Problem:   Acute on chronic respiratory failure with hypoxia (HCC) Active Problems:   Acute on chronic combined systolic and diastolic CHF (congestive heart failure) (HCC)   Paroxysmal a-fib   COPD exacerbation (HCC)   Type 2 diabetes mellitus (HCC)   Anxiety   Acute respiratory failure with hypoxia (HCC)   NSTEMI (non-ST elevated myocardial infarction) (HCC)   Pressure ulcer   SECONDARY DIAGNOSIS:   Past Medical History  Diagnosis Date  . Muscle weakness   . Non-ST elevated myocardial infarction [facility])     a. likely demand ischemia 02/2015 in the setting of ARF and acute on chronic CHF  . Humerus fracture     a. left, b. 02/2015; c. in the setting of increased weakness  . Dysphagia   . Anemia   . Paroxysmal a-fib (HCC)     a. reported a-fib, no documented EKGs showing this  . Chronic systolic CHF (congestive heart failure) (HCC)     a. echo 02/2015: EF 40-45%, mod dilated LA, mildly dilated RA, mod MR/TR, mildly elevated PASP 43 mm Hg, no evidence of intracardiac shunting, intact interatrial and interventricular septa  . COPD (chronic obstructive pulmonary disease) (HCC)     a. on home O2  . Hypertensive chronic kidney disease     a. Stage 4  . Diabetes mellitus without complication (HCC)   . PVD (peripheral vascular disease) (HCC)   . Dementia   . Anxiety disorder     HOSPITAL COURSE:    82 year old female with history of systolic congestive heart failure, COPD chronic respiratory failure on 1 L nasal cannula who presented with shortness of breath respiratory failure on2/2/17.  1. Acute on chronic respiratory failure with hypoxia:  - Secondary to acute on chronic systolic congestive heart failure as well as COPD exacerbation, - bilateral pneumonia:  - ABG with no elevated co2 - Continue supplemental oxygen, DuoNeb treatments - was on IV Zosyn, blood cultures negative.  - lasix on hold - flutter valve -  made comfort care by palliative care.  2. Non-Q-wave MI, appreciate cardiology's input, Continue patient on metoprolol ,aspirin ,Nitroglycerin topically - on asa and plavix - appreciate cardiology consult. - Echo is normal, per cardiologist. F/u report - significantly elevated troponin - family choose hospice.  3. Acute kidney injury on chronic kidney disease: creatinine was 1.9-2.0 in August 2016 , - worsened renal function and also hypernatremia. - now comfort care.  3. Type 2 diabetes with hyperglycemia- -Added increased doses of Levemir twice a day and also NovoLog pre-meals. -Continue to monitor.  4. Essential hypertension: Lopressor, added Norvasc as well  5. Hypernatremia- continue D5 half normal saline  6. Venous thromboembolic prophylactic: Heparin subcutaneous  7. Anemia,- likely anemia of chronic disease - hb dropped with loading dose of plavix and heparin drip for NSTEMI - stable around 8.0 now, on plavix and also aspirin - monitor for now- transfuse as needed - now comfort care.   DISCHARGE CONDITIONS:   Fair.  CONSULTS  OBTAINED:  Treatment Team:  Iran Ouch, MD Lamont Dowdy, MD  DRUG ALLERGIES:  No Known Allergies  DISCHARGE MEDICATIONS:   Current Discharge Medication List    START taking these medications   Details  acetaminophen (TYLENOL) 325 MG tablet Take 2 tablets (650 mg total) by mouth every 4 (four)  hours as needed for mild pain or fever (or Fever >/= 101).    albuterol (PROVENTIL) (2.5 MG/3ML) 0.083% nebulizer solution Take 3 mLs (2.5 mg total) by nebulization every 4 (four) hours as needed for wheezing or shortness of breath. Qty: 30 mL, Refills: 0    bisacodyl (DULCOLAX) 10 MG suppository Place 1 suppository (10 mg total) rectally daily as needed for moderate constipation. Qty: 6 suppository, Refills: 0    lactulose (CHRONULAC) 10 GM/15ML solution Take 30 mLs (20 g total) by mouth 2 (two) times daily as needed for mild constipation. Qty: 120 mL, Refills: 0    LORazepam (ATIVAN) 0.5 MG tablet Take 1 tablet (0.5 mg total) by mouth every 4 (four) hours as needed for anxiety. Qty: 15 tablet, Refills: 0    !! Morphine Sulfate (MORPHINE CONCENTRATE) 10 MG/0.5ML SOLN concentrated solution Take 0.25 mLs (5 mg total) by mouth every 2 (two) hours as needed for moderate pain, severe pain or shortness of breath. Qty: 30 mL, Refills: 0    !! Morphine Sulfate (MORPHINE CONCENTRATE) 10 MG/0.5ML SOLN concentrated solution Take 0.25 mLs (5 mg total) by mouth every 8 (eight) hours. Qty: 30 mL, Refills: 0    prochlorperazine (COMPAZINE) 25 MG suppository Place 1 suppository (25 mg total) rectally every 8 (eight) hours as needed for nausea or vomiting. Qty: 6 suppository, Refills: 0     !! - Potential duplicate medications found. Please discuss with provider.    STOP taking these medications     albuterol (PROVENTIL HFA;VENTOLIN HFA) 108 (90 BASE) MCG/ACT inhaler      aspirin EC 81 MG tablet      azithromycin (ZITHROMAX) 250 MG tablet      Cholecalciferol (VITAMIN D) 2000 UNITS tablet      diazepam (VALIUM) 10 MG tablet      docusate sodium (COLACE) 100 MG capsule      folic acid (FOLVITE) 1 MG tablet      furosemide (LASIX) 40 MG tablet      glipiZIDE (GLUCOTROL XL) 10 MG 24 hr tablet      HYDROcodone-acetaminophen (NORCO/VICODIN) 5-325 MG per tablet      metFORMIN (GLUCOPHAGE)  500 MG tablet      metoprolol tartrate (LOPRESSOR) 25 MG tablet      Omeprazole (PRILOSEC PO)      omeprazole (PRILOSEC) 40 MG capsule      PARoxetine (PAXIL) 20 MG tablet      predniSONE (DELTASONE) 10 MG tablet      psyllium (METAMUCIL) 58.6 % packet      risperiDONE (RISPERDAL) 0.5 MG tablet      simvastatin (ZOCOR) 20 MG tablet      Tiotropium Bromide Monohydrate 2.5 MCG/ACT AERS      vitamin B-12 (CYANOCOBALAMIN) 1000 MCG tablet          DISCHARGE INSTRUCTIONS:    Follow with PMD as needed.  If you experience worsening of your admission symptoms, develop shortness of breath, life threatening emergency, suicidal or homicidal thoughts you must seek medical attention immediately by calling 911 or calling your MD immediately  if symptoms less severe.  You Must read complete instructions/literature along with all the  possible adverse reactions/side effects for all the Medicines you take and that have been prescribed to you. Take any new Medicines after you have completely understood and accept all the possible adverse reactions/side effects.   Please note  You were cared for by a hospitalist during your hospital stay. If you have any questions about your discharge medications or the care you received while you were in the hospital after you are discharged, you can call the unit and asked to speak with the hospitalist on call if the hospitalist that took care of you is not available. Once you are discharged, your primary care physician will handle any further medical issues. Please note that NO REFILLS for any discharge medications will be authorized once you are discharged, as it is imperative that you return to your primary care physician (or establish a relationship with a primary care physician if you do not have one) for your aftercare needs so that they can reassess your need for medications and monitor your lab values.    Today   CHIEF COMPLAINT:   Chief Complaint   Patient presents with  . Respiratory Distress    HISTORY OF PRESENT ILLNESS:  Laura Jefferson  is a 82 y.o. female presents with acute respiratory failure. Patient is on BiPAP, and is unable to provide much of her own history today. Her daughter is at bedside with her in the ED and provides much of the history. Her daughter is her caregiver at home. She states that for the past week or so she's been having progressive symptoms, slowly increasing shortness of breath and fatigue. As well as some increasing orthopnea. She went to her outpatient physician and was given a Z-Pak, which she has taken about half, with the thought that this might be due to her COPD. However, tonight she had acutely worse and they brought her to the ED for evaluation. She was hypoxic on arrival, chest x-rays consistent with basilar congestion and CHF. Patient does have an elevated white blood cell count at 19, though she denies any infectious symptoms. Chest x-ray did not show any pneumonia. Patient required BiPAP in order to improve her O2 sats. Hospitalists were called for admission.  VITAL SIGNS:  Blood pressure 151/50, pulse 41, temperature 97.7 F (36.5 C), temperature source Oral, resp. rate 18, height 5' (1.524 m), weight 46.902 kg (103 lb 6.4 oz), SpO2 98 %.  I/O:   Intake/Output Summary (Last 24 hours) at 01/13/16 0941 Last data filed at 01/13/16 0532  Gross per 24 hour  Intake 612.75 ml  Output      0 ml  Net 612.75 ml    PHYSICAL EXAMINATION:   GENERAL: 82 y.o.-year-old patient lying in the bed not in any acute distress.  EYES: Pupils equal, round, reactive to light . No scleral icterus. Extraocular muscles intact.  HEENT: Head atraumatic, normocephalic. Oropharynx and nasopharynx clear.  NECK: Supple, no jugular venous distention. No thyroid enlargement, no tenderness.  LUNGS: scattered wheezing. No rhonchi, diminished breath sounds at the bases. No crackles or rales . Not using accessory muscles  to breathe. CARDIOVASCULAR: S1, S2 normal. No rubs, or gallops. 2/6 systolic murmur present,. ABDOMEN: Soft, nontender, nondistended. Bowel sounds present. No organomegaly or mass.  EXTREMITIES: No pedal edema, cyanosis, or clubbing.  NEUROLOGIC: drowsy, arose on stuimuli, appears very weak. PSYCHIATRIC: The patient is drowsy and arousable. SKIN: No obvious rash, lesion, or ulcer.   DATA REVIEW:   CBC  Recent Labs Lab 01/12/16 0533  WBC 20.2*  HGB 8.8*  HCT 29.1*  PLT 209    Chemistries   Recent Labs Lab 01/12/16 0533  NA 165*  K 4.3  CL 123*  CO2 36*  GLUCOSE 376*  BUN 96*  CREATININE 2.56*  CALCIUM 8.8*    Cardiac Enzymes  Recent Labs Lab 01/09/16 0603  TROPONINI 3.38*    Microbiology Results  Results for orders placed or performed during the hospital encounter of 01/01/16  MRSA PCR Screening     Status: Abnormal   Collection Time: 01/07/16 12:45 AM  Result Value Ref Range Status   MRSA by PCR POSITIVE (A) NEGATIVE Final    Comment: CRITICAL RESULT CALLED TO, READ BACK BY AND VERIFIED WITH: ZACHERY ALLEN AT 0437 ON 01/07/16 BY VAB        The GeneXpert MRSA Assay (FDA approved for NASAL specimens only), is one component of a comprehensive MRSA colonization surveillance program. It is not intended to diagnose MRSA infection nor to guide or monitor treatment for MRSA infections.     RADIOLOGY:  Dg Chest 1 View  01/11/2016  CLINICAL DATA:  81 year old with current history of chronic combined systolic and diastolic CHF, chronic kidney disease, with acute onset of shortness of breath. Possible aspiration. Followup CHF and left lower lobe asymmetric pulmonary edema versus pneumonia. EXAM: Portable CHEST 1 VIEW COMPARISON:  01/09/2016 and earlier. FINDINGS: Cardiac silhouette moderately to markedly enlarged. Mild diffuse interstitial pulmonary edema, unchanged. Enlarging bilateral pleural effusions. Dense consolidation in the left lower lobe, unchanged.  New dense consolidation in the right lower lobe. IMPRESSION: 1. Stable mild diffuse interstitial pulmonary edema. 2. Enlarging bilateral pleural effusions. 3. Stable dense passive atelectasis versus pneumonia in the left lower lobe. 4. New dense passive atelectasis versus pneumonia in the right lower lobe. Electronically Signed   By: Hulan Saas M.D.   On: 01/11/2016 18:52    EKG:   Orders placed or performed during the hospital encounter of 01/01/16  . EKG 12-Lead  . EKG 12-Lead  . EKG 12-Lead  . EKG 12-Lead  . EKG 12-Lead  . EKG 12-Lead  . EKG 12-Lead  . EKG 12-Lead  . EKG 12-Lead  . EKG 12-Lead      Management plans discussed with the patient, family and they are in agreement.  CODE STATUS:     Code Status Orders        Start     Ordered   01/02/16 0405  Do not attempt resuscitation (DNR)   Continuous    Question Answer Comment  In the event of cardiac or respiratory ARREST Do not call a "code blue"   In the event of cardiac or respiratory ARREST Do not perform Intubation, CPR, defibrillation or ACLS   In the event of cardiac or respiratory ARREST Use medication by any route, position, wound care, and other measures to relive pain and suffering. May use oxygen, suction and manual treatment of airway obstruction as needed for comfort.      01/02/16 0404    Code Status History    Date Active Date Inactive Code Status Order ID Comments User Context   07/22/2015  8:01 PM 07/25/2015  4:24 PM DNR [id number]  Katharina Caper, MD ED   04/01/2015 10:53 AM 04/11/2015  7:46 PM DNR [id number]  Auburn Bilberry, MD Inpatient      TOTAL TIME TAKING CARE OF THIS PATIENT: 35 minutes.    Altamese Dilling M.D on 01/13/2016 at 9:41 AM  Between 7am to 6pm - Pager - [phone]  After 6pm go to [email or web address] - password [email or web address]  [facility]  Office  [phone]  CC: Primary care physician; Jerl Mina, MD   Note: This dictation was prepared with Dragon  dictation along with smaller phrase technology. Any transcriptional errors that result from this process are unintentional.

## 2016-01-13 NOTE — Progress Notes (Signed)
Palliative Medicine Inpatient Consult Follow Up Note   Name: Laura Jefferson Date: 01/13/2016 MRN: [id number]  DOB: March 11, 1925  Referring Physician: Altamese Dilling, MD    PLAN:  Palliative Care consult requested for this 82 y.o. female for goals of medical therapy in patient with acute on chronic respiratory failure.  She has a combination of diastolic CHF, pleural effusions, COPD (70 pk yr history --quit last year), Afib-rapid, pneumonia, and pleural effusions. In addition, she has stage IV renal failure and diabetes.  When diuresed, her renal failure has worsened and she became hypernatremic. When given fluids for these conditions, she has become hypoxic with increased pleural effusions and she also had severe hyperglycemia from the D5W (and steroids for the COPD).  She is talking some today and her oxygen sats are good today.  But she is not expected to remain stable for long. Since admission she has had a history of being stable for a day or two and then she would have to be transferred to ICU and placed on BIPAP for hypoxia and respiratory distress.  This has happened a couple of times so far. Family now agrees with Hospice Home placement as pt is expected to keep up her 'roller coaster' of being ok a few days and then 'crashing' and going back and forth between being in CHF or else dehydrated. She also is not eating well at all --not enough to meet her minimal nutrition needs.  Her severe loss of appetite adds to the reasons why she is felt to not have a life expectancy of longer than 3 weeks.    Yesterday, after a long conference with son, Jan, and daughter, Delice Bison [facility]), they seemed to understand what was going on medically with pt and also agreed pt need not have 'things drawn out so'. They desired Hospice Home placement.    Later in the day, I ran into family in the hall and daughter asked me to talk to pt's sister there. It seemed that family had not been able to articulate why  (medically) pt is slowly dying.  I did the best I could in talking briefly in the hallway.  Others at [facility] may need to talk further.  I think that it is important to stress that the heart and kidneys must work together, but in her, when we try to fix the heart issues (treating CHF for instance), her kidneys worsen.  And when we treat the kidneys (with fluids) her heart does not handle this well.    Family is aware that pts sometimes 'rally' and do better when taken off of the medical meds and placed on comfort meds.  But this is not expected to last long if it does occur.  Pt does need morphine for air hunger and such is ordered.  She is to go to [facility] soon reportedly.   IMPRESSION: COPD exacerbation and chronic COPD Acute on chronic Resp flre with hypoxia due to acute on chronic systolic CHF and COPD exac and possible bilat pneumonia And now worsening effusions NSTEMI CAD with h/o NSTEMI Dysphagia Anemia Paroxysmal Afib Chronic Systolic and diastolic CHF with EF of 40-45%, mod MR and TR 4/16 echo AFib RVR 150-160 HTN  CKD PVD Dementia Anxiety  Former Smoker --70 yrs smoking--quit 10 mos ago DM2 Dysphagia (diet d-1 with thins) Hypernatremia     CODE STATUS: DNR     PAST MEDICAL HISTORY: Past Medical History  Diagnosis Date  . Muscle weakness   . Non-ST  elevated myocardial infarction [facility])     a. likely demand ischemia 02/2015 in the setting of ARF and acute on chronic CHF  . Humerus fracture     a. left, b. 02/2015; c. in the setting of increased weakness  . Dysphagia   . Anemia   . Paroxysmal a-fib (HCC)     a. reported a-fib, no documented EKGs showing this  . Chronic systolic CHF (congestive heart failure) (HCC)     a. echo 02/2015: EF 40-45%, mod dilated LA, mildly dilated RA, mod MR/TR, mildly elevated PASP 43 mm Hg, no evidence of intracardiac shunting, intact interatrial and interventricular septa  . COPD (chronic obstructive pulmonary disease) (HCC)      a. on home O2  . Hypertensive chronic kidney disease     a. Stage 4  . Diabetes mellitus without complication (HCC)   . PVD (peripheral vascular disease) (HCC)   . Dementia   . Anxiety disorder     PAST SURGICAL HISTORY:  Past Surgical History  Procedure Laterality Date  . Arm surgery    . Peripheral arterial stent graft    . Esophagogastroduodenoscopy (egd) with propofol N/A 11/06/2015    Procedure: ESOPHAGOGASTRODUODENOSCOPY (EGD) WITH PROPOFOL;  Surgeon: Elnita Maxwell, MD;  Location: [facility] ENDOSCOPY;  Service: Endoscopy;  Laterality: N/A;    Vital Signs: BP 151/50 mmHg  Pulse 41  Temp(Src) 97.7 F (36.5 C) (Oral)  Resp 18  Ht 5' (1.524 m)  Wt 46.902 kg (103 lb 6.4 oz)  BMI 20.19 kg/m2  SpO2 98% Filed Weights   01/10/16 0500 01/11/16 0500 01/12/16 0500  Weight: 46.1 kg (101 lb 10.1 oz) 45.105 kg (99 lb 7 oz) 46.902 kg (103 lb 6.4 oz)    Estimated body mass index is 20.19 kg/(m^2) as calculated from the following:   Height as of this encounter: 5' (1.524 m).   Weight as of this encounter: 46.902 kg (103 lb 6.4 oz).  PHYSICAL EXAM: Resting NAD Skin w/o mottling or cyanosis VS are stable at this time  LABS: CBC:    Component Value Date/Time   WBC 20.2* 01/12/2016 0533   WBC 10.2 03/11/2015 0749   HGB 8.8* 01/12/2016 0533   HGB 8.2* 03/11/2015 0749   HCT 29.1* 01/12/2016 0533   HCT 26.4* 03/11/2015 0749   PLT 209 01/12/2016 0533   PLT 244 03/11/2015 0749   MCV 81.6 01/12/2016 0533   MCV 93 03/11/2015 0749   NEUTROABS 14.2* 01/01/2016 2247   NEUTROABS 7.4* 03/11/2015 0749   LYMPHSABS 2.7 01/01/2016 2247   LYMPHSABS 1.6 03/11/2015 0749   MONOABS 1.4* 01/01/2016 2247   MONOABS 0.8 03/11/2015 0749   EOSABS 0.8* 01/01/2016 2247   EOSABS 0.3 03/11/2015 0749   BASOSABS 0.2* 01/01/2016 2247   BASOSABS 0.0 03/11/2015 0749   Comprehensive Metabolic Panel:    Component Value Date/Time   NA 165* 01/12/2016 0533   NA CANCELED 04/29/2015 1612   NA 143  03/11/2015 0749   K 4.3 01/12/2016 0533   K 3.9 03/11/2015 0749   CL 123* 01/12/2016 0533   CL 109 03/11/2015 0749   CO2 36* 01/12/2016 0533   CO2 27 03/11/2015 0749   BUN 96* 01/12/2016 0533   BUN CANCELED 04/29/2015 1612   BUN 47* 03/11/2015 0749   CREATININE 2.56* 01/12/2016 0533   CREATININE 1.48* 03/11/2015 0749   GLUCOSE 376* 01/12/2016 0533   GLUCOSE CANCELED 04/29/2015 1612   GLUCOSE 209* 03/11/2015 0749   CALCIUM 8.8* 01/12/2016 0533  CALCIUM 8.0* 03/11/2015 0749   AST 22 07/22/2015 2116   AST 18 03/07/2015 1730   ALT 13* 07/22/2015 2116   ALT 12* 03/07/2015 1730   ALKPHOS 60 07/22/2015 2116   ALKPHOS 68 03/07/2015 1730   BILITOT 0.2* 07/22/2015 2116   BILITOT 0.4 03/07/2015 1730   PROT 7.4 07/22/2015 2116   PROT 6.1* 03/07/2015 1730   ALBUMIN 3.5 07/22/2015 2116   ALBUMIN 2.5* 03/07/2015 1730     More than 50% of the visit was spent in counseling/coordination of care: YES  Time Spent: 15 min

## 2016-01-23 ENCOUNTER — Ambulatory Visit: Payer: Medicare PPO | Admitting: Family

## 2016-01-28 DEATH — deceased

## 2016-09-22 IMAGING — CR DG HUMERUS 2V *L*
1 series · 2 of 2 positions shown · non-contrast
Comparison: Shoulder films of 02/26/2015

CLINICAL DATA: Left arm pain after fall on [REDACTED].

EXAM:
LEFT HUMERUS - 2+ VIEW

[Series 1: dxr humerus left · 0.14mm/px · 2 of 2 slices shown]
[im 1/2]
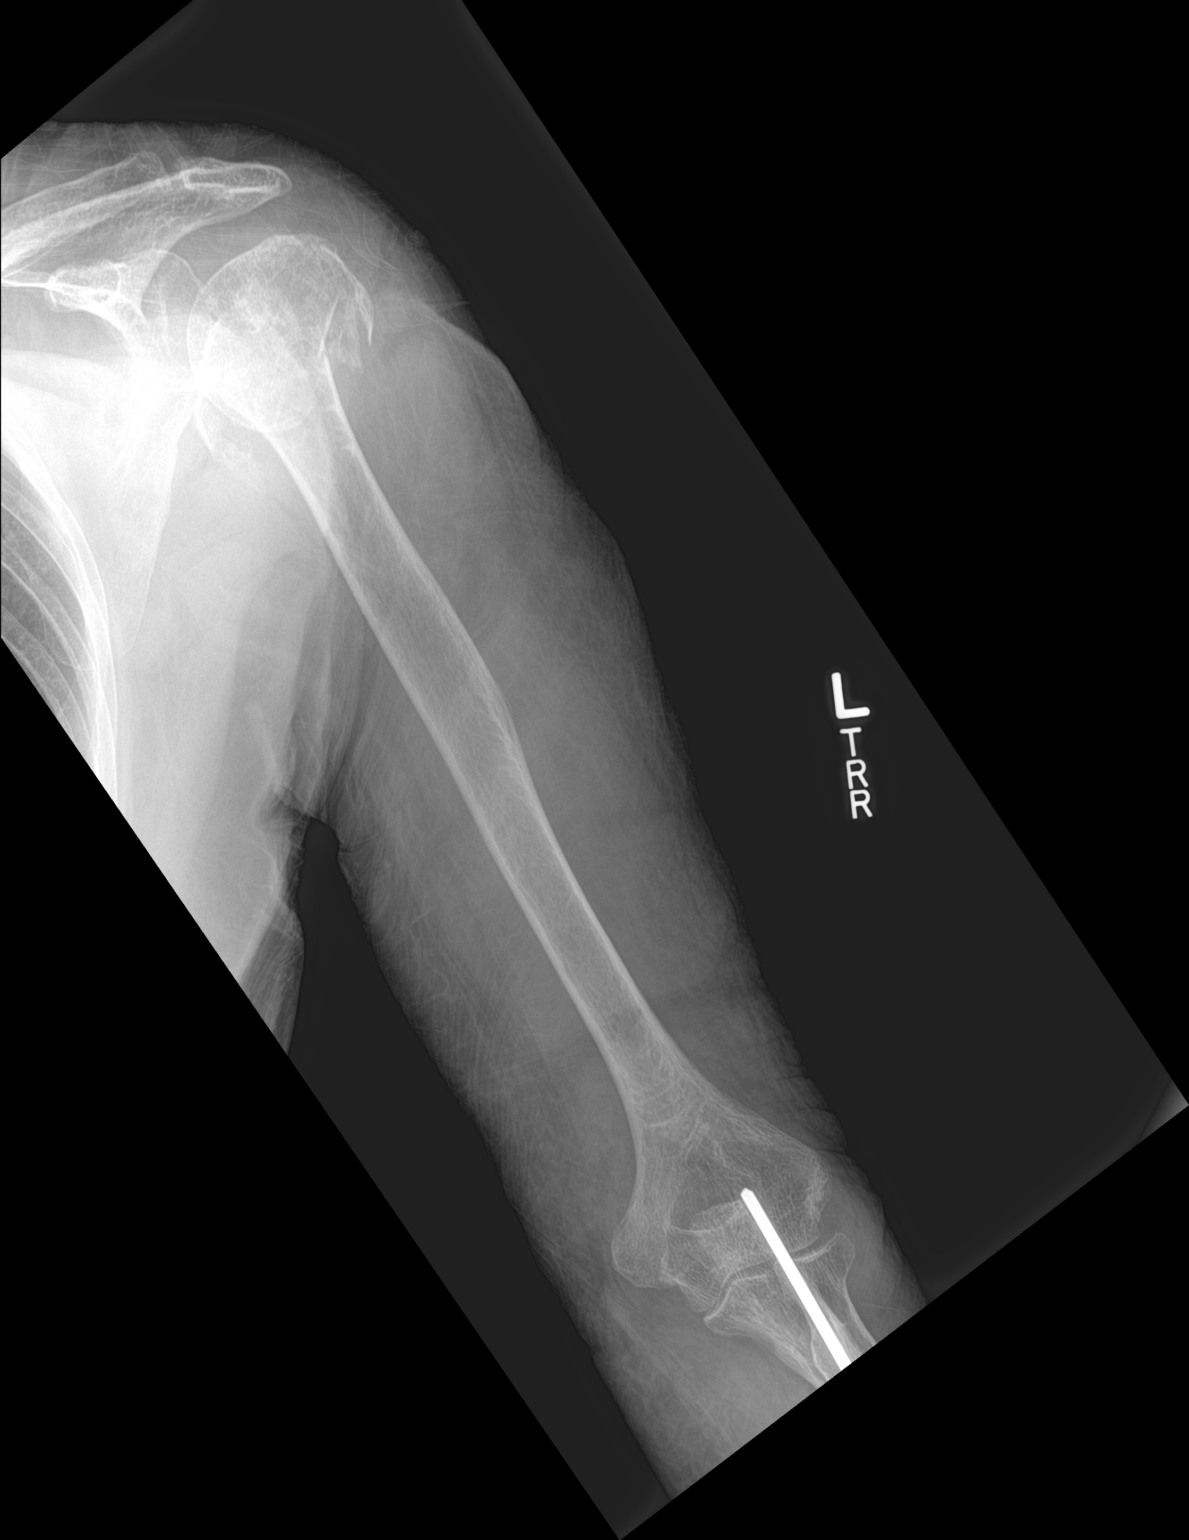
[im 2/2]
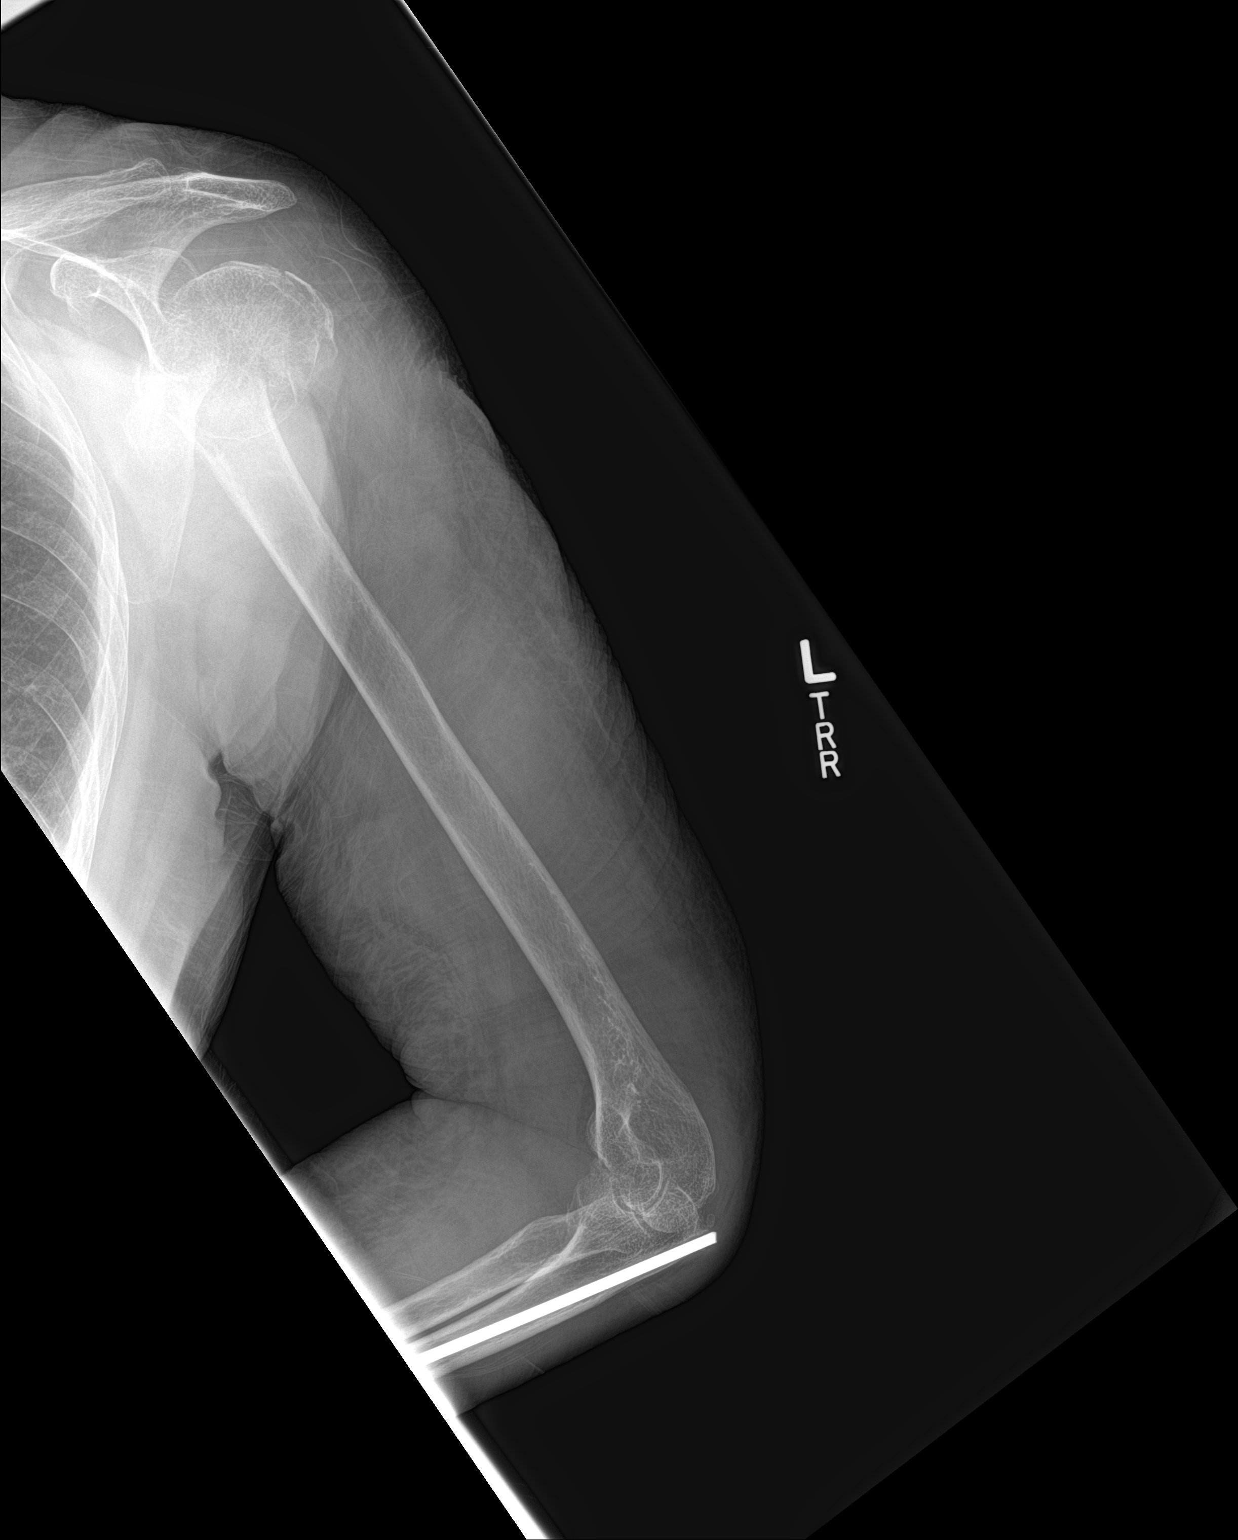

[2 of 2 positions shown; findings below may reference images not displayed]

FINDINGS: Redemonstration of a comminuted proximal humerus fracture. Proximal
ulna pin fixation. No new or distal fractures identified.
IMPRESSION: Proximal humerus comminuted fracture, as described previously. No
new or acute findings.

## 2016-10-30 IMAGING — CT CT CHEST W/O CM
1 of 2 series · 14 of 31 positions shown, 18 images · non-contrast
Comparison: 04/01/2015

CLINICAL DATA: 89-year-old female with past medical history
significant for congestive heart failure last known ejection
fraction of 40%, diabetes mellitus, CK D with baseline creatinine of
1.8, recent and non-ST segment elevation MI, and left humerus
fracture was discharged from rehabilitation to home yesterday
presents to the hospital secondary to worsening respiratory
distress.

EXAM:
CT CHEST WITHOUT CONTRAST
TECHNIQUE: Multidetector CT imaging of the chest was performed following the
standard protocol without IV contrast.

[Series 2: routine chest wo · axial · 0.68mm/px · z∈[-750,-515]mm · 14 of 57 slices shown, 18 images]
[im 5/57  mediastinal]
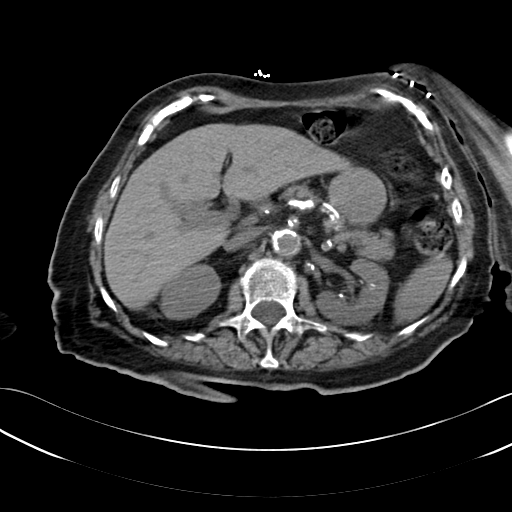
[im 5/57  lung]
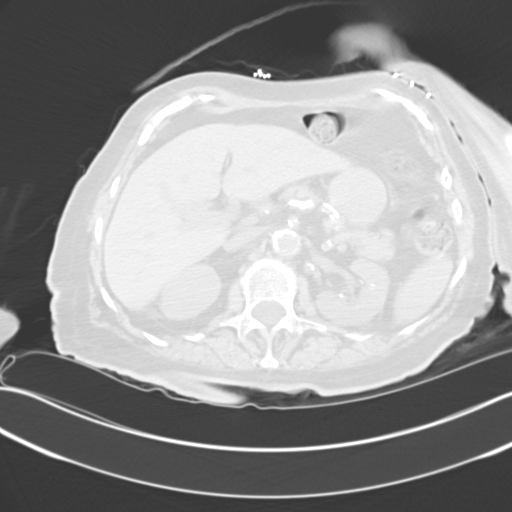
[im 9/57  lung]
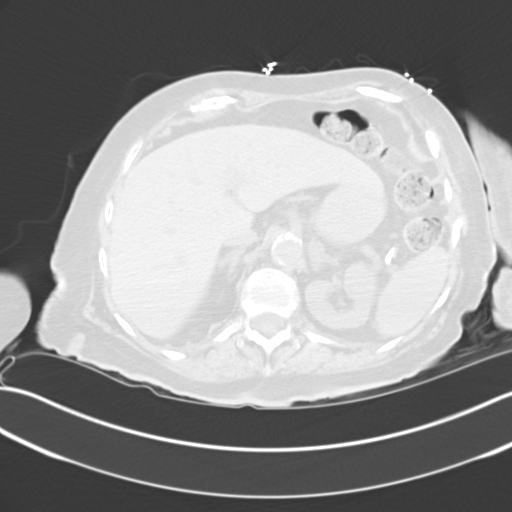
[im 13/57  lung]
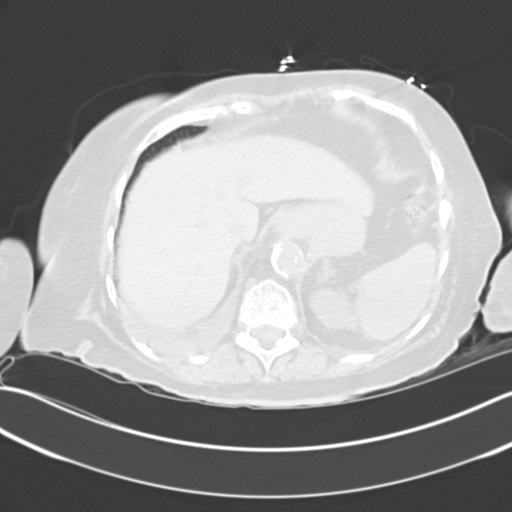
[im 18/57  lung]
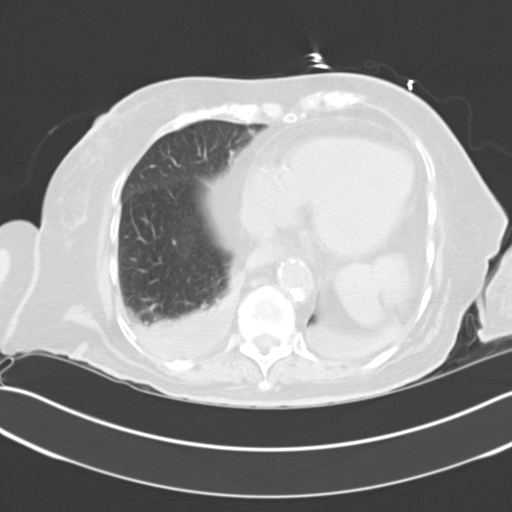
[im 22/57  mediastinal]
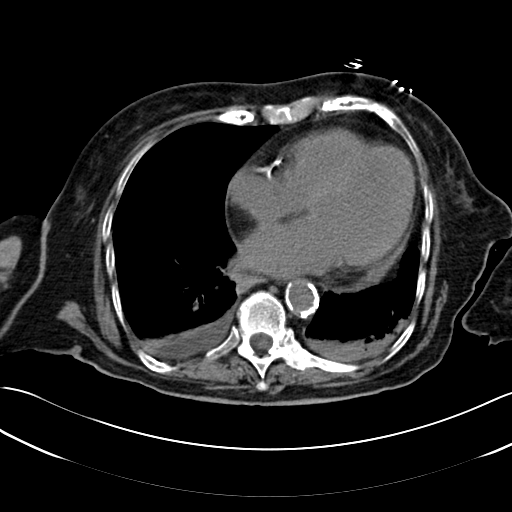
[im 22/57  lung]
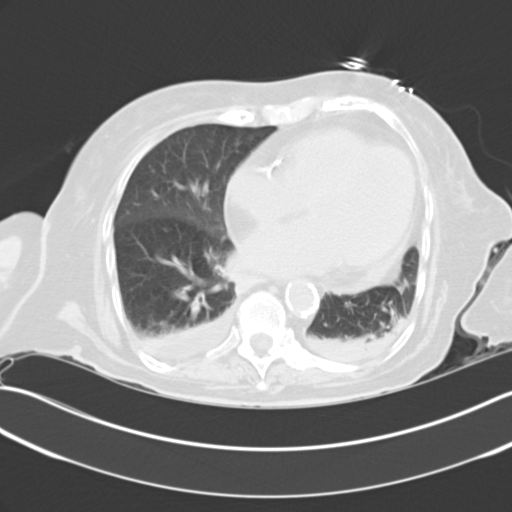
[im 26/57  lung]
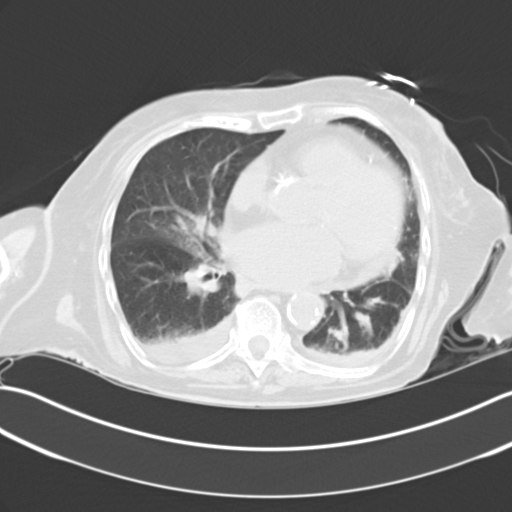
[im 27/57  lung]
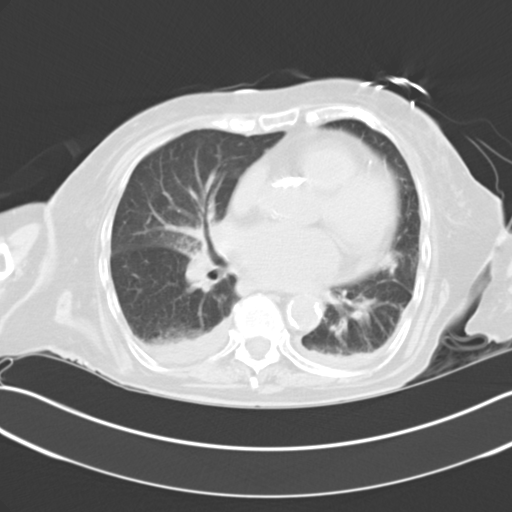
[im 29/57  lung]
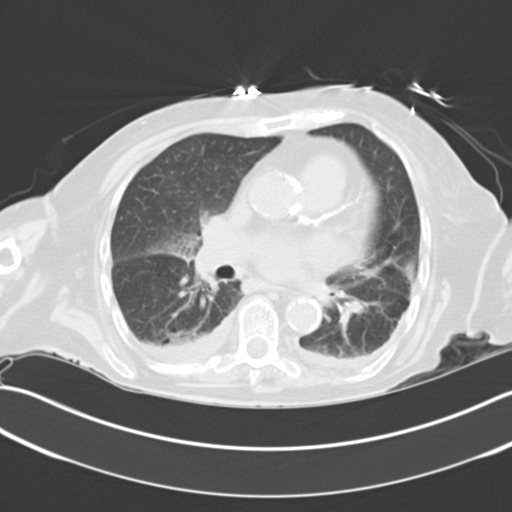
[im 31/57  mediastinal]
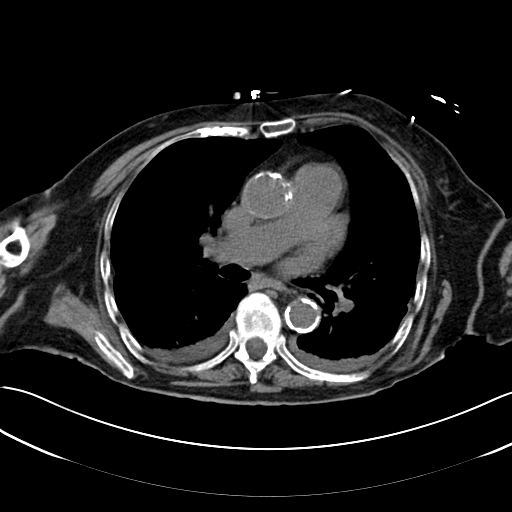
[im 31/57  lung]
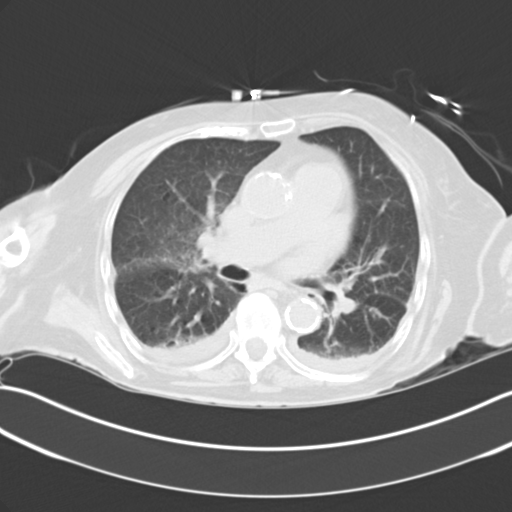
[im 35/57  lung]
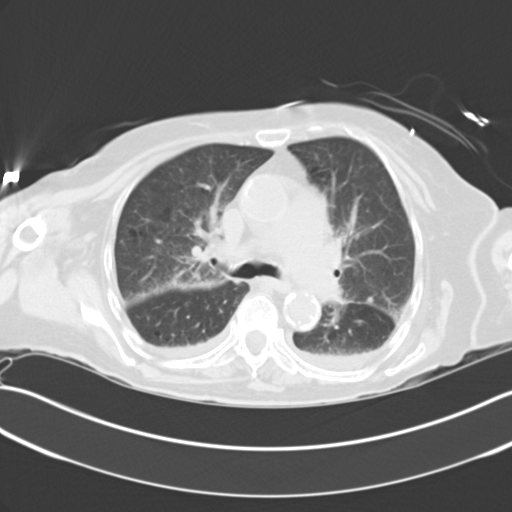
[im 39/57  lung]
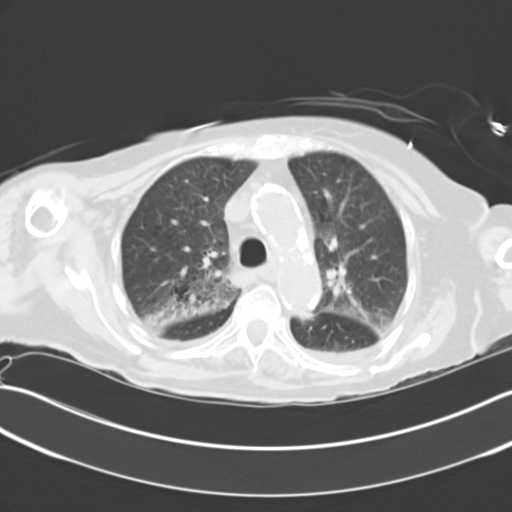
[im 44/57  lung]
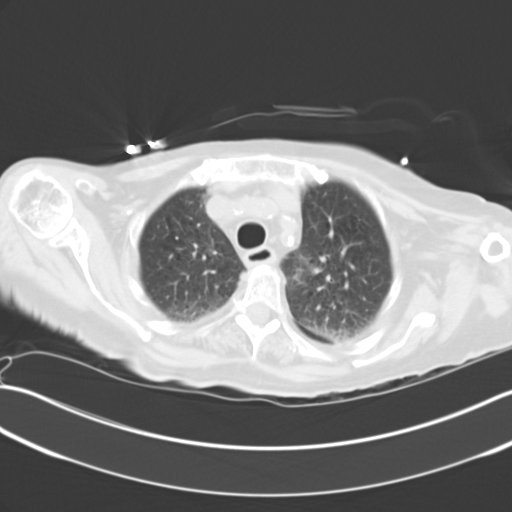
[im 48/57  mediastinal]
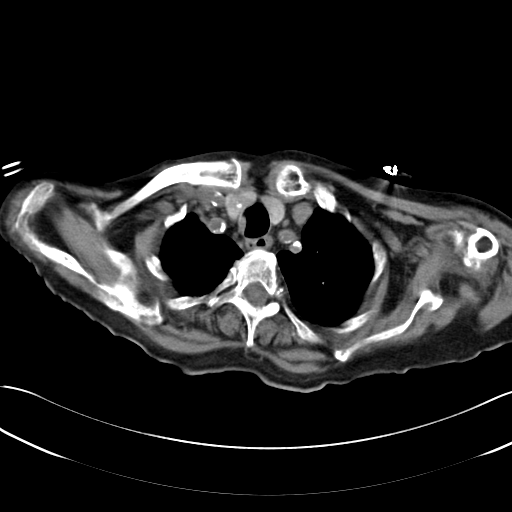
[im 48/57  lung]
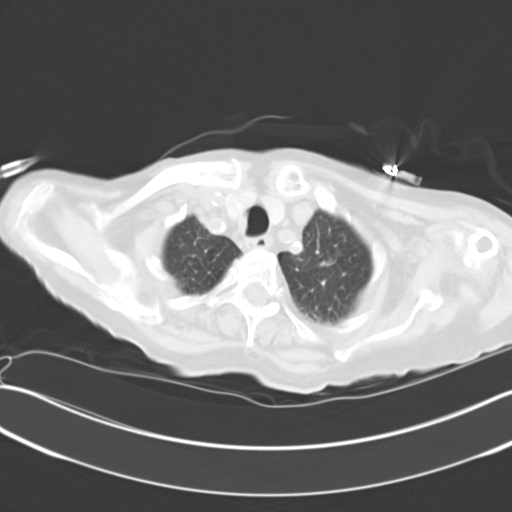
[im 52/57  lung]
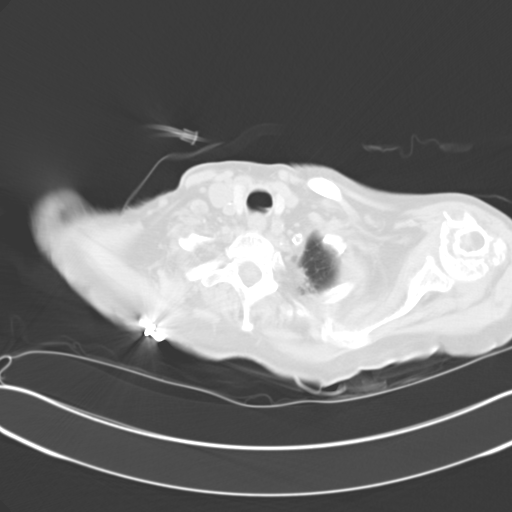

[14 of 31 positions shown; findings below may reference images not displayed]

FINDINGS: Thoracic inlet: No mass or adenopathy. Calcification in the right
thyroid lobe.

Mediastinum and hila: Mild enlargement of the heart. Trace amount
pericardial fluid. There are dense coronary artery calcifications.
Mild enlargement of the pulmonary arteries, right measuring 27 mm in
left measuring 28 mm. Aorta is normal in caliber. There is dense
atherosclerotic calcification along the thoracic aorta and the
aortic arch branch vessels. No mediastinal or hilar masses or
pathologically enlarged lymph nodes.

Lungs and pleura: Mild to moderate changes of centrilobular
emphysema. There is dependent opacity adjacent to the obliques
fissures in both upper lobes most consistent with subsegmental
atelectasis. There is a greater degree of atelectasis in the lower
lobes adjacent to small bilateral pleural effusions. Bronchial wall
thickening is noted in the right middle lobe, left upper lobe
lingula and both lower lobes, which may be chronic. Consider
bronchitis if there are consistent clinical symptoms. No evidence of
lobar pneumonia. There is mild interstitial prominence most evident
in the upper lobes. This is most likely chronic. A minor degree of
interstitial edema is possible.

Limited upper abdomen: 12 mm left adrenal adenoma. Diffuse vascular
calcifications. No acute finding.

Musculoskeletal: Minor depression of the upper endplate of T7. Mild
compression fracture of T11. These fractures are likely chronic.
Bones are demineralized. No osteoblastic or osteolytic lesions.
IMPRESSION: 1. There is cardiomegaly, small bilateral pleural effusions, mild
upper lobe interstitial thickening and a trace amount of pericardial
fluid. Mild congestive heart failure is suggested.
2. No evidence of pneumonia.
3. Emphysema and dependent bilateral lung atelectasis.

## 2017-02-13 IMAGING — CR DG CHEST 2V
1 series · 2 of 2 positions shown · non-contrast
Comparison: CT chest dated 04/07/2015

CLINICAL DATA: Cough, congestion, shortness of breath

EXAM:
CHEST  2 VIEW

[Series 1: dg chest 2 view · 0.14mm/px · 2 of 2 slices shown]
[im 1/2]
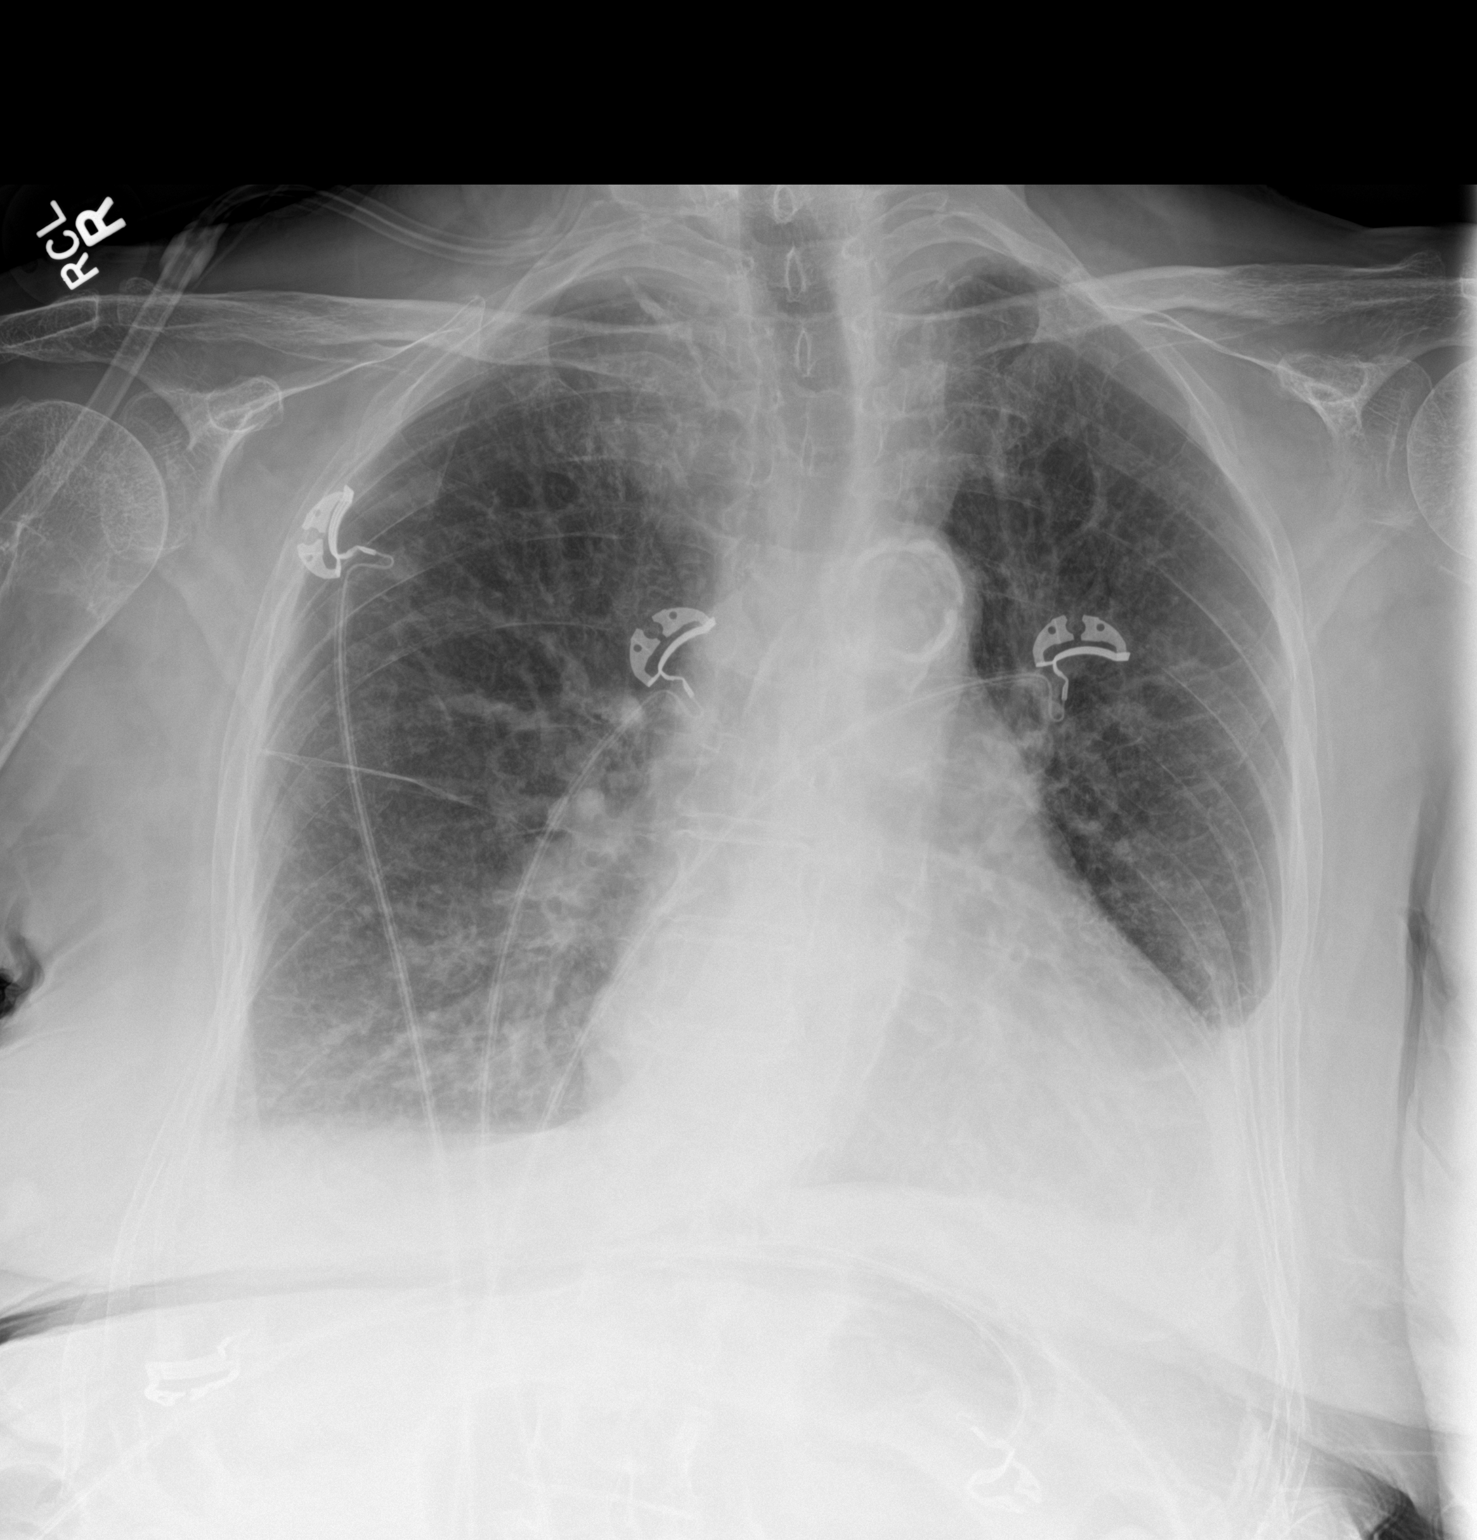
[im 2/2]
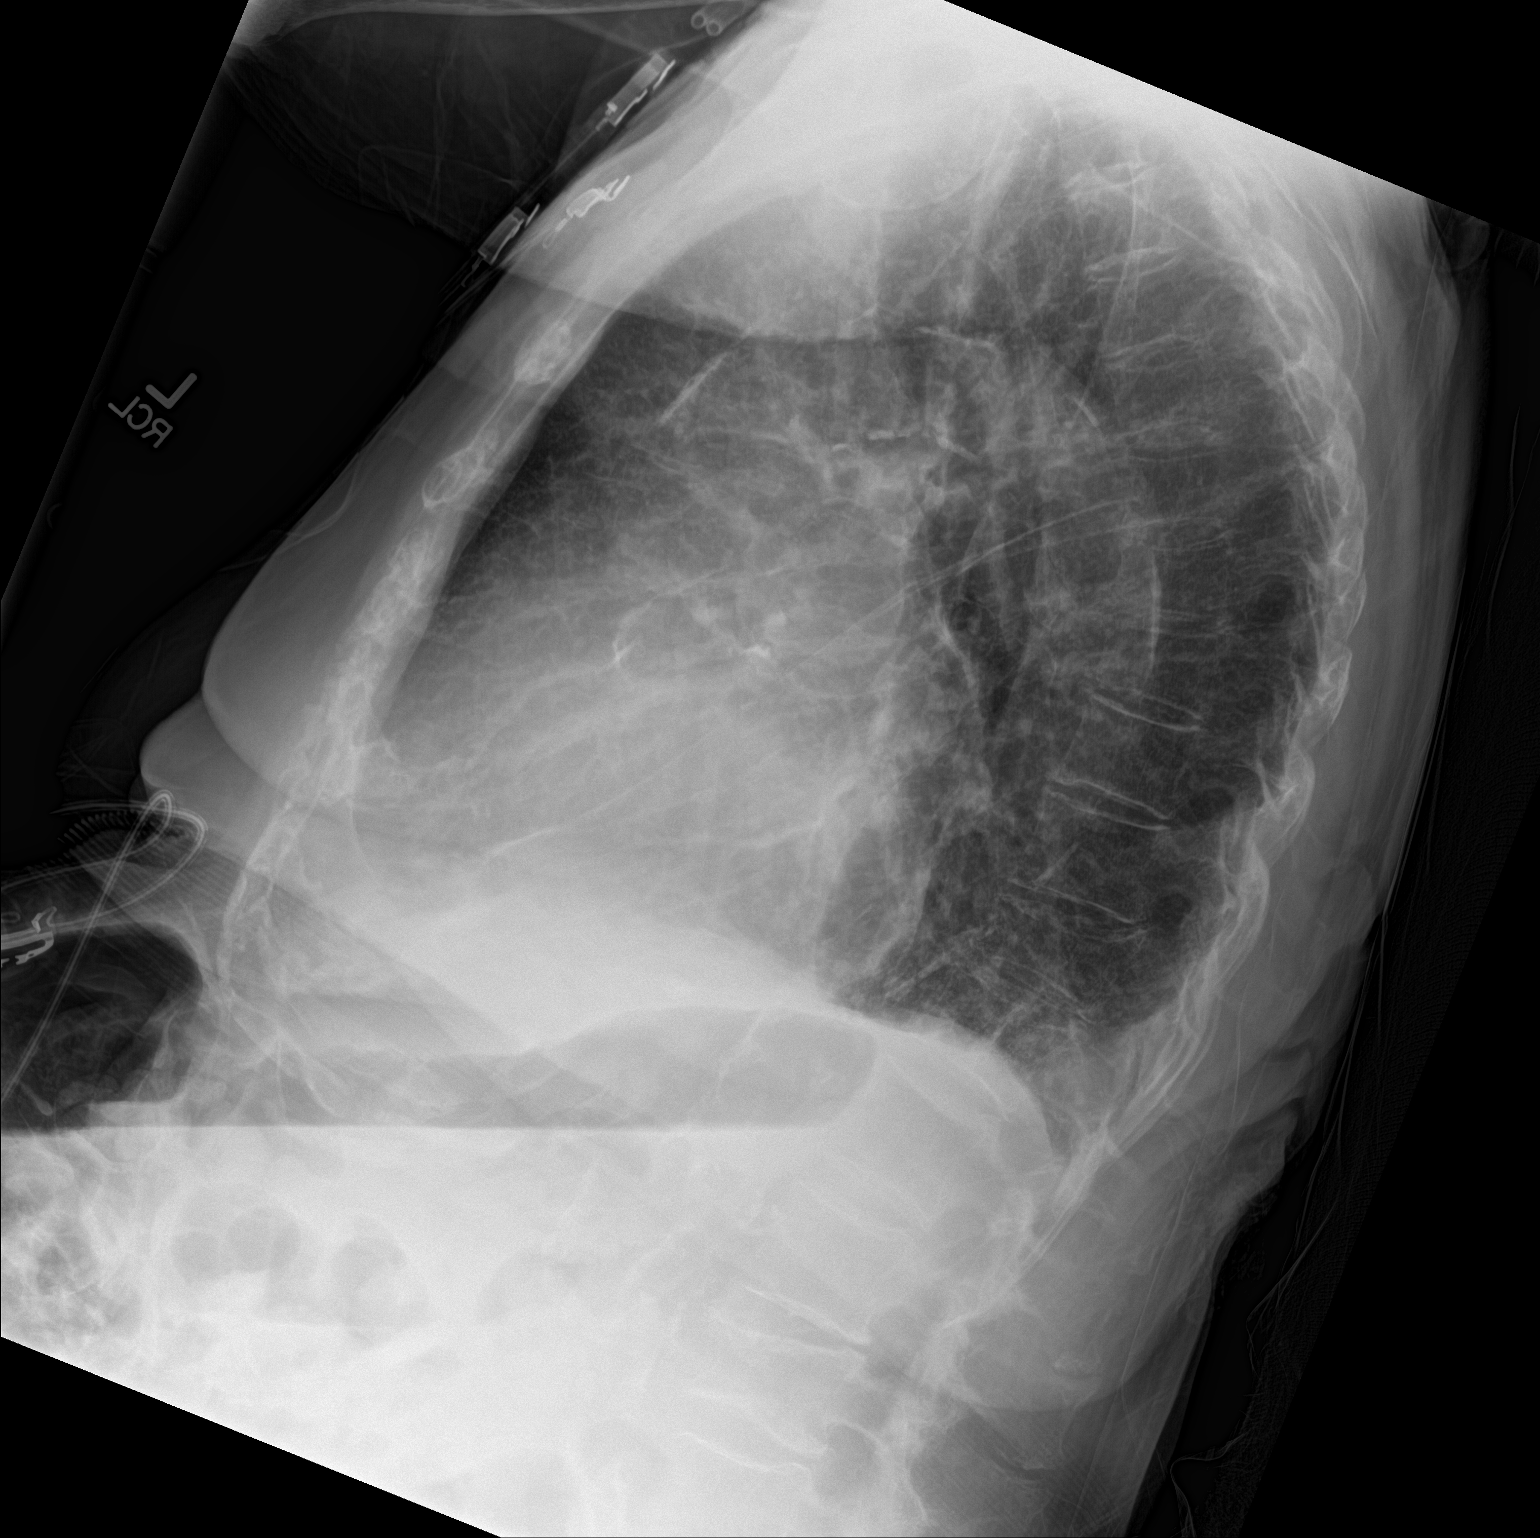

[2 of 2 positions shown; findings below may reference images not displayed]

FINDINGS: Cardiomegaly with mild interstitial edema. Small bilateral pleural
effusions. No pneumothorax.
IMPRESSION: Cardiomegaly with mild interstitial edema.

Small bilateral pleural effusions.
